# Patient Record
Sex: Female | Born: 1970 | Hispanic: No | Marital: Single | Smoking: Former smoker
Health system: Southern US, Community
[De-identification: ages and names within clinical notes are randomized; demographics above are authoritative.]

## PROBLEM LIST (undated history)

## (undated) DIAGNOSIS — F4310 Post-traumatic stress disorder, unspecified: Secondary | ICD-10-CM

## (undated) DIAGNOSIS — F429 Obsessive-compulsive disorder, unspecified: Secondary | ICD-10-CM

## (undated) DIAGNOSIS — J45909 Unspecified asthma, uncomplicated: Secondary | ICD-10-CM

## (undated) DIAGNOSIS — F319 Bipolar disorder, unspecified: Secondary | ICD-10-CM

## (undated) DIAGNOSIS — K219 Gastro-esophageal reflux disease without esophagitis: Secondary | ICD-10-CM

## (undated) DIAGNOSIS — F32A Depression, unspecified: Secondary | ICD-10-CM

## (undated) DIAGNOSIS — F419 Anxiety disorder, unspecified: Secondary | ICD-10-CM

## (undated) DIAGNOSIS — M1990 Unspecified osteoarthritis, unspecified site: Secondary | ICD-10-CM

## (undated) DIAGNOSIS — F329 Major depressive disorder, single episode, unspecified: Secondary | ICD-10-CM

---

## 2015-03-18 ENCOUNTER — Emergency Department (HOSPITAL_COMMUNITY)
Admission: EM | Admit: 2015-03-18 | Discharge: 2015-03-18 | Disposition: A | Discharge status: Home / Self Care | Payer: Medicaid Other | Attending: Emergency Medicine | Admitting: Emergency Medicine

## 2015-03-18 ENCOUNTER — Encounter (HOSPITAL_COMMUNITY): Payer: Self-pay

## 2015-03-18 DIAGNOSIS — Z720 Tobacco use: Secondary | ICD-10-CM | POA: Insufficient documentation

## 2015-03-18 DIAGNOSIS — M069 Rheumatoid arthritis, unspecified: Secondary | ICD-10-CM

## 2015-03-18 DIAGNOSIS — J45909 Unspecified asthma, uncomplicated: Secondary | ICD-10-CM | POA: Insufficient documentation

## 2015-03-18 HISTORY — DX: Unspecified asthma, uncomplicated: J45.909

## 2015-03-18 HISTORY — DX: Unspecified osteoarthritis, unspecified site: M19.90

## 2015-03-18 MED ORDER — HYDROCODONE-ACETAMINOPHEN 5-325 MG PO TABS: 1.0000 | ORAL_TABLET | Freq: Four times a day (QID) | ORAL | Status: DC | PRN

## 2015-03-18 MED ORDER — INDOMETHACIN 25 MG PO CAPS: 25.0000 mg | ORAL_CAPSULE | Freq: Three times a day (TID) | ORAL | Status: DC | PRN

## 2015-03-18 NOTE — Discharge Instructions (Signed)
Rheumatoid Arthritis Rheumatoid arthritis is a long-term (chronic) inflammatory disease that causes pain, swelling, and stiffness of the joints. It can affect the entire body, including the eyes and lungs. The effects of rheumatoid arthritis vary widely among those with the condition. CAUSES  The cause of rheumatoid arthritis is not known. It tends to run in families and is more common in women. Certain cells of the body's natural defense system (immune system) do not work properly and begin to attack healthy joints. It primarily involves the connective tissue that lines the joints (synovial membrane). This can cause damage to the joint. SYMPTOMS   Pain, stiffness, swelling, and decreased motion of many joints, especially in the hands and feet.  Stiffness that is worse in the morning. It may last 1-2 hours or longer.  Numbness and tingling in the hands.  Fatigue.  Loss of appetite.  Weight loss.  Low-grade fever.  Dry eyes and mouth.  Firm lumps (rheumatoid nodules) that grow beneath the skin in areas such as the elbows and hands. DIAGNOSIS  Diagnosis is based on the symptoms described, an exam, and blood tests. Sometimes, X-rays are helpful. TREATMENT  The goals of treatment are to relieve pain, reduce inflammation, and to slow down or stop joint damage and disability. Methods vary and may include:  Maintaining a balance of rest, exercise, and proper nutrition.  Medicines:  Pain relievers (analgesics).  Corticosteroids and nonsteroidal anti-inflammatory drugs (NSAIDs) to reduce inflammation.  Disease-modifying antirheumatic drugs (DMARDs) to try to slow the course of the disease.  Biologic response modifiers to reduce inflammation and damage.  Physical therapy and occupational therapy.  Surgery for patients with severe joint damage. Joint replacement or fusing of joints may be needed.  Routine monitoring and ongoing care, such as office visits, blood and urine tests, and  X-rays. HOME CARE INSTRUCTIONS   Remain physically active and reduce activity when the disease gets worse.  Eat a well-balanced diet.  Put heat on affected joints when you wake up and before activities. Keep the heat on the affected joint for as long as directed by your health care provider.  Put ice on affected joints following activities or exercising.  Put ice in a plastic bag.  Place a towel between your skin and the bag.  Leave the ice on for 15-20 minutes, 3-4 times per day, or as directed by your health care provider.  Take medicines and supplements only as directed by your health care provider.  Use splints as directed by your health care provider. Splints help maintain joint position and function.  Do not sleep with pillows under your knees. This may lead to spasms.  Participate in a self-management program to keep current with the latest treatment and coping skills. SEEK IMMEDIATE MEDICAL CARE IF:  You have fainting episodes.  You have periods of extreme weakness.  You rapidly develop a hot, painful joint that is more severe than usual joint aches.  You have chills.  You have a fever. FOR MORE INFORMATION   American College of Rheumatology: www.rheumatology.org  Arthritis Foundation: www.arthritis.org Document Released: 07/04/2000 Document Revised: 11/21/2013 Document Reviewed: 08/13/2011 [facility] Patient Information 2015 [place], [place]. This information is not intended to replace advice given to you by your health care provider. Make sure you discuss any questions you have with your health care provider.  Emergency Department Resource Guide 1) Find a Doctor and Pay Out of Pocket Although you won't have to find out who is covered by your insurance plan, it is  a good idea to ask around and get recommendations. You will then need to call the office and see if the doctor you have chosen will accept you as a new patient and what types of options they offer for  patients who are self-pay. Some doctors offer discounts or will set up payment plans for their patients who do not have insurance, but you will need to ask so you aren't surprised when you get to your appointment.  2) Contact Your Local Health Department Not all health departments have doctors that can see patients for sick visits, but many do, so it is worth a call to see if yours does. If you don't know where your local health department is, you can check in your phone book. The CDC also has a tool to help you locate your state's health department, and many state websites also have listings of all of their local health departments.  3) Find a Walk-in Clinic If your illness is not likely to be very severe or complicated, you may want to try a walk in clinic. These are popping up all over the country in pharmacies, drugstores, and shopping centers. They're usually staffed by nurse practitioners or physician assistants that have been trained to treat common illnesses and complaints. They're usually fairly quick and inexpensive. However, if you have serious medical issues or chronic medical problems, these are probably not your best option.  No Primary Care Doctor: - Call Health Connect at  [phone] - they can help you locate a primary care doctor that  accepts your insurance, provides certain services, etc. - Physician Referral Service- [phone]  Chronic Pain Problems: Organization         Address  Phone   Notes  [facility]  [phone] Patients need to be referred by their primary care doctor.   Medication Assistance: Organization         Address  Phone   Notes  [facility] Medication [facility] [address] [place] [place], [place] [address] [phone] --Must be a resident of [facility] -- Must have NO insurance coverage whatsoever (no Medicaid/ Medicare, etc.) -- The pt. MUST have a primary care doctor that directs their care regularly  and follows them in the community   [facility]  [phone]   [employer]  [phone]    Agencies that provide inexpensive medical care: Organization         Address  Phone   Notes  [facility]  [phone]   [facility]    [phone]   [facility] [address] [place], [place] [address] [phone]   [facility] [address] [place] [place] [phone]   Planned Parenthood    [phone]   [facility]    [phone]   [facility] and [facility]  [address], [place] Phone:  [phone], Fax:  [phone] Hours of Operation:  9 am - 6 pm, M-F.  Also accepts Medicaid/Medicare and self-pay.  [facility] [place] [facility]  [address], [place] Phone: [phone], Fax: [phone]. Hours of Operation:  8:30 am - 5:30 pm, M-F.  Also accepts Medicaid and self-pay.  [facility] [place] [address], [place] Phone: [phone]   [facility] [address] [place] [address] Natasha Bence [place], [place] [phone], Ext. [phone] Mondays & Thursdays: 7-9 AM.  First 15 patients are seen on a first come, first serve basis.    Medicaid-accepting [facility] Providers:  Organization         Address  Phone   Notes  [facility] [address], [place] [phone] Also accepts self-pay patients.  [facility] [address] Laurell Josephs [place], [place]  [phone]   [facility] [address], [place] [phone]   [facility] [address] [place] [phone]   Renaye Rakers [address], [place]   [phone] Only accepts [place] Access [place] patients after they have their name applied to their card.   Self-Pay (no insurance) in [facility]:  Organization         Address  Phone   Notes  [facility]ick[facility]  Cell Patients, [facility] [address] [place], [place] [phone]   [facility] [address] [place], [place] [phone]   [facility] [place]  [address], [place] [phone]   [facility]/Dr. Osei-Bonsu  [address], [place] or [address], [place] [phone] Phone number for both [place] and [place] locations is the same.  [facility] [address] [place] [phone]   [facility] [address] [place] or [address] [phone]   [facility] [address], [place] [phone], phone; [phone], fax Sees patients 1st and 3rd Saturday of every month.  Must not qualify for public or private insurance (i.e. Medicaid, Medicare, [place] Health Choice, Veterans' Benefits)  Household income should be no more than 200% of the poverty level The clinic cannot treat you if you are pregnant or think you are pregnant  Sexually transmitted diseases are not treated at the clinic.    Dental Care: Organization         Address  Phone  Notes  [facility] Department of [facility] [address] [place], [place] [phone] Accepts children up to age 95 who are enrolled in [place] or [place] Health Choice; pregnant women with a Medicaid card; and children who have applied for Medicaid or [place] Health Choice, but were declined, whose parents can pay a reduced fee at time of service.  [facility] Department of [facility]  [address], [place] [phone] Accepts children up to age 69 who are enrolled in [place] or [place] Health Choice; pregnant women with a Medicaid card; and children who have applied for Medicaid or  Health Choice, but were declined, whose parents can pay a reduced fee at time of service.  [facility]  [address] [place], [place] [phone] Patients are seen by appointment only. Walk-ins are not accepted. [facility] will see patients 5 years of age and older. Monday - Tuesday (8am-5pm) Most Wednesdays (8:30-5pm) $30 per visit, cash only  [facility]  [address], [facility] [phone] Patients are seen by appointment only. Walk-ins are not accepted. [facility] will see patients 40 years of age and older. One Wednesday Evening (Monthly: Volunteer Based).  $30 per visit, cash only  [employer]  [phone] for adults; Children under age 62, call Graduate  Pediatric Dentistry at [phone]. Children aged 41-14, please call [phone] to request a pediatric application.  Dental services are provided in all areas of dental care including fillings, crowns and bridges, complete and partial dentures, implants, gum treatment, root canals, and extractions. Preventive care is also provided. Treatment is provided to both adults and children. Patients are selected via a lottery and there is often a waiting list.   [facility] [address], [place]  [phone] [email or web address]   [facility] [address] [place], [place] [phone], Ext. [phone] Second and Fourth Thursday of each month, opens at 6:30 AM; Clinic ends at 9 AM.  Patients are seen on a first-come first-served basis, and a limited number are seen during each clinic.   [facility]  [address] Ether Griffins [place], [place] [phone]   Eligibility Requirements You must have lived in [place], [place], or [place] counties for at least the last three months.   You cannot be eligible for state or federal sponsored [place], including [employer], [place], or [employer].   You generally cannot be eligible for healthcare insurance through your employer.    How to apply: Eligibility  screenings are held every Tuesday and Wednesday afternoon from 1:00 pm until 4:00 pm. You do not need an appointment for the interview!  [facility] [address], [place], [place] [phone]   [facility] Health Department  [phone]   [facility] Health Department  [phone]   [facility] Health Department  [phone]    Behavioral Health Resources in the Community: Intensive Outpatient Programs Organization         Address  Phone  Notes  [facility] Services [address], [place], [place] [phone]   [facility] Outpatient [address] [place], [place] [phone]   [facility] [address], [place], [place]  [phone]   [facility] [address],  [place], [place] [phone] or [phone]   Substance Abuse Resources Organization         Address  Phone  Notes  [facility]  [phone]   [facility]  [phone]   [facility] [place]  [phone]   Floydene Flock  [phone]   Residential & Outpatient Substance Abuse Program  [phone]   Psychological Services Organization         Address  Phone  Notes  [facility] Behavioral Health  [phone]   [facility] Services  [phone]   [facility] Mental Health [address] [place] [phone] or [phone]    Mobile Crisis Teams Organization         Address  Phone  Notes  [facility], Mobile Crisis Care Unit  [phone]   [facility]  [address] [place], [place] [phone]   Doristine Locks [address] [place] [phone]    Self-Help/Support Groups Organization         Address  Phone             Notes  [facility] [place] - variety of support groups  336- [phone] Call for more information  Narcotics Anonymous (NA), [facility] [address], [employer] [place]  2 meetings at  this location   Statistician         Address  Phone  Notes  [facility] [address],  [place]  [phone]   [facility]  [address], [place] [address], [place], [place] [phone]   [facility] [address] [place], [place] [phone] Admissions: 8am-3pm M-F  [facility] [address],    [place], [place] [phone]   [facility] [address] [place], [place], [place] [phone]   [facility] [address],  [place], [place] [phone]   [facility] - Intensive Outpatient [address] Laurell Josephs [address], [place], [place] [phone]   [facility] ([facility] [address] [place]  [place], [place] [phone] or [phone]   [facility] ([facility]) [address], [place], [place] [phone] Accepts Medicaid  [facility] [address],  [place] [phone] Substance Abuse/Addiction Treatment   [facility] Organization         Address  Phone  Notes  [facility]  [phone]   Angie Fava, PhD [address] Ervin Knack [place], [place]   [phone] or [phone]   [facility] Behavioral   [address] [place], [place] [phone]   [facility] [address], [place], [place] [phone] Insurance/Medicaid/sponsorship through [facility] [address] [place] [address]                                    [place], [place] [phone] Therapy/tele-psych/case  [facility] [address], [place] [phone]    Dr. Lolly Mustache  [phone]   [facility] [place]  [employer] [facility] Dept. 1) [address], [place] 2) [address], [place] 3)  [address], [place] [phone]  [phone]   [facility] Child Abuse Hotline [phone] or 5348017415 (After Hours)

## 2015-03-18 NOTE — ED Provider Notes (Signed)
CSN: [id number]     Arrival date & time 03/18/15  1655 History   First MD Initiated Contact with Patient 03/18/15 2002     Chief Complaint  Patient presents with  . Rheumatoid Arthritis     (Consider location/radiation/quality/duration/timing/severity/associated sxs/prior Treatment) HPI Comments: 44 year old female with a history of rheumatoid arthritis presents to the emergency department for further evaluation of pain associated with her rheumatoid arthritis. Patient reports an aching, constant pain which waxes and wanes. It is present mostly in the MCP joints of her hands. Patient reports that pain is worse with movement. She has been taking 2 tablets of 220 mg Aleve twice a day for pain without significant improvement. Patient has also noted pain to her right shoulder which radiates up her upper back into the base of her neck. Patient denies any recent strenuous activity or heavy lifting. She states that she relocated to the area 3.5 months ago. She has had no medications for 5-6 months. Her pain has been at its worst for the last 3-4 days. She is requesting a refill of her maintenance medications for rheumatoid arthritis including methotrexate, Indocin, and folic acid. She states that she is looking for a primary care doctor and was approved for Medicaid one week ago.  The history is provided by the patient. No language interpreter was used.    Past Medical History  Diagnosis Date  . Arthritis     rheumatoid   . Asthma    History reviewed. No pertinent past surgical history. History reviewed. No pertinent family history. Social History  Substance Use Topics  . Smoking status: Current Every Day Smoker -- 0.50 packs/day    Types: Cigarettes  . Smokeless tobacco: None  . Alcohol Use: Yes     Comment: occ    OB History    No data available      Review of Systems  Constitutional: Negative for fever.  Musculoskeletal: Positive for myalgias, joint swelling and arthralgias.   Neurological: Negative for weakness.  All other systems reviewed and are negative.   Allergies  Review of patient's allergies indicates no known allergies.  Home Medications   Prior to Admission medications   Medication Sig Start Date End Date Taking? Authorizing Provider  HYDROcodone-acetaminophen (NORCO/VICODIN) 5-325 MG per tablet Take 1-2 tablets by mouth every 6 (six) hours as needed for severe pain. 03/18/15   Antony Madura, PA-C  indomethacin (INDOCIN) 25 MG capsule Take 1 capsule (25 mg total) by mouth 3 (three) times daily as needed. 03/18/15   Antony Madura, PA-C   BP 125/84 mmHg  Pulse 72  Temp(Src) 98.3 F (36.8 C) (Oral)  Resp 16  Ht 5\' 5"  (1.651 m)  Wt 120 lb (54.432 kg)  BMI 19.97 kg/m2  SpO2 100%  LMP 03/12/2015   Physical Exam  Constitutional: She is oriented to person, place, and time. She appears well-developed and well-nourished. No distress.  Nontoxic/nonseptic appearing  HENT:  Head: Normocephalic and atraumatic.  Eyes: Conjunctivae and EOM are normal. No scleral icterus.  Neck: Normal range of motion.  Cardiovascular: Normal rate, regular rhythm and intact distal pulses.   Distal radial pulse 2+ b/l. Capillary refill brisk in all digits.  Pulmonary/Chest: Effort normal. No respiratory distress.  Respirations even and unlabored.  Musculoskeletal: Normal range of motion.  Swelling and mild deformity diffusely to MCP joints as well as to the R DIP joint and the DIP joint of b/l first fingers. Patient with TTP along the course of the R trapezius muscle.  Normal ROM exhibited to all joints of b/l upper extremities.  Neurological: She is alert and oriented to person, place, and time. She exhibits normal muscle tone. Coordination normal.  Sensation to light touch intact. Patient has 5/5 grip strength in b/l upper extremities.  Skin: Skin is warm and dry. No rash noted. She is not diaphoretic. No erythema. No pallor.  Psychiatric: She has a normal mood and affect.  Her behavior is normal.  Nursing note and vitals reviewed.   ED Course  Procedures (including critical care time) Labs Review Labs Reviewed - No data to display  Imaging Review No results found.   I have personally reviewed and evaluated these images and lab results as part of my medical decision-making.   EKG Interpretation None      MDM   Final diagnoses:  Rheumatoid arthritis flare    44 year old female presents to the emergency department for assistance with pain control of a rheumatoid arthritis flare. Patient was followed by a rheumatologist in [place], [place], but relocated to the area 3.5 months ago and has yet to find a doctor. Patient with evidence of swelling and deformity to her MCP joints as well as scattered DIP joints of bilateral hands. No evidence of septic arthritis as joints are not erythematous or hot to touch. Patient has fairly well preserved range of motion in all of her digits.  Patient is afebrile and well-appearing. Have discussed with patient that she needs to find a doctor as soon as possible to begin her methotrexate regimen. I have discussed with her my hesitancy in prescribing her this medication, as she would need adequate follow-up to ensure no adverse side effects. Patient verbalizes understanding. I will, however, refill patient's prescription of indomethacin which she has not had for some months. Have advised that she not take naproxen or ibuprofen with this medication. Will give a short course of Norco for pain control. Have discussed with the patient that this (Norco) is a not a long-term solution for her pain control. Patient verbalizes understanding to this as well. Patient has been given the resource guide as well as referral to to primary care offices within the [facility] system. Return precautions discussed and provided. Patient agreeable to plan with known address concerns. Patient discharged in good condition.   Filed Vitals:   03/18/15  1731 03/18/15 2028  BP: 117/86 125/84  Pulse: 65 72  Temp: 98.3 F (36.8 C)   TempSrc: Oral   Resp: 16 16  Height: 5\' 5"  (1.651 m)   Weight: 120 lb (54.432 kg)   SpO2: 99% 100%     , PA-C 03/18/15 2111  2112, MD 03/19/15 [phone]

## 2015-03-18 NOTE — ED Notes (Signed)
Pt. Standing up in room talking on cell phone.

## 2015-03-18 NOTE — ED Notes (Signed)
Pt reports she has just moved to area and does not have a PCP to follow her rheumatoid arthritis.  Pt has not had her medications in 5-6 months.  Pt is not able to tolerate pain any longer.

## 2015-06-19 ENCOUNTER — Emergency Department (HOSPITAL_COMMUNITY): Payer: Medicaid Other

## 2015-06-19 ENCOUNTER — Emergency Department (HOSPITAL_COMMUNITY)
Admission: EM | Admit: 2015-06-19 | Discharge: 2015-06-19 | Disposition: A | Discharge status: Home / Self Care | Payer: Medicaid Other | Attending: Emergency Medicine | Admitting: Emergency Medicine

## 2015-06-19 ENCOUNTER — Encounter (HOSPITAL_COMMUNITY): Payer: Self-pay | Admitting: Emergency Medicine

## 2015-06-19 ENCOUNTER — Other Ambulatory Visit: Payer: Self-pay

## 2015-06-19 DIAGNOSIS — R0789 Other chest pain: Secondary | ICD-10-CM

## 2015-06-19 DIAGNOSIS — F419 Anxiety disorder, unspecified: Secondary | ICD-10-CM | POA: Insufficient documentation

## 2015-06-19 DIAGNOSIS — R079 Chest pain, unspecified: Secondary | ICD-10-CM | POA: Diagnosis present

## 2015-06-19 DIAGNOSIS — F17210 Nicotine dependence, cigarettes, uncomplicated: Secondary | ICD-10-CM | POA: Insufficient documentation

## 2015-06-19 DIAGNOSIS — M069 Rheumatoid arthritis, unspecified: Secondary | ICD-10-CM | POA: Insufficient documentation

## 2015-06-19 DIAGNOSIS — J45909 Unspecified asthma, uncomplicated: Secondary | ICD-10-CM | POA: Diagnosis not present

## 2015-06-19 LAB — CBC
HEMATOCRIT: 37.6 % (ref 36.0–46.0)
HEMOGLOBIN: 12.5 g/dL (ref 12.0–15.0)
MCH: 28.9 pg (ref 26.0–34.0)
MCHC: 33.2 g/dL (ref 30.0–36.0)
MCV: 86.8 fL (ref 78.0–100.0)
Platelets: 297 10*3/uL (ref 150–400)
RBC: 4.33 MIL/uL (ref 3.87–5.11)
RDW: 15.5 % (ref 11.5–15.5)
WBC: 9.2 10*3/uL (ref 4.0–10.5)

## 2015-06-19 LAB — I-STAT TROPONIN, ED: Troponin i, poc: 0 ng/mL (ref 0.00–0.08)

## 2015-06-19 LAB — BASIC METABOLIC PANEL
Anion gap: 7 (ref 5–15)
BUN: 12 mg/dL (ref 6–20)
CALCIUM: 9 mg/dL (ref 8.9–10.3)
CHLORIDE: 103 mmol/L (ref 101–111)
CO2: 26 mmol/L (ref 22–32)
CREATININE: 0.84 mg/dL (ref 0.44–1.00)
GFR calc Af Amer: >60 mL/min (ref >60)
GFR calc non Af Amer: >60 mL/min (ref >60)
Glucose, Bld: 104 mg/dL — ABNORMAL HIGH (ref 65–99)
Potassium: 4.1 mmol/L (ref 3.5–5.1)
SODIUM: 136 mmol/L (ref 135–145)

## 2015-06-19 MED ORDER — KETOROLAC TROMETHAMINE 30 MG/ML IJ SOLN: 30.0000 mg | Freq: Once | INTRAMUSCULAR | Status: DC

## 2015-06-19 MED ORDER — HYDROCODONE-ACETAMINOPHEN 5-325 MG PO TABS: 1.0000 | ORAL_TABLET | Freq: Four times a day (QID) | ORAL | Status: DC | PRN

## 2015-06-19 MED ORDER — OXYCODONE-ACETAMINOPHEN 5-325 MG PO TABS
1.0000 | ORAL_TABLET | Freq: Once | ORAL | Status: AC
  Administered 2015-06-19: 1 via ORAL
  Filled 2015-06-19: qty 1

## 2015-06-19 MED ORDER — IBUPROFEN 400 MG PO TABS
600.0000 mg | ORAL_TABLET | Freq: Once | ORAL | Status: AC
  Administered 2015-06-19: 600 mg via ORAL
  Filled 2015-06-19: qty 1

## 2015-06-19 MED ORDER — INDOMETHACIN 25 MG PO CAPS: 25.0000 mg | ORAL_CAPSULE | Freq: Three times a day (TID) | ORAL | Status: DC | PRN

## 2015-06-19 NOTE — ED Provider Notes (Signed)
CSN: [id number]     Arrival date & time 06/19/15  2355 History   First MD Initiated Contact with Patient 06/19/15 0450     Chief Complaint  Patient presents with  . Chest Pain     (Consider location/radiation/quality/duration/timing/severity/associated sxs/prior Treatment) HPI  This is a 44 year old female with a history of rheumatoid arthritis who presents with chest pain. Patient reports onset of chest pain several hours prior to arrival. It is sharp and right-sided. It is nonradiating. It is currently 8 out of 10. It is worse with movement and different positioning. Patient denies shortness of breath, cough, or fever. Denies any history high cholesterol, hypertension, early family history of heart disease, or diabetes. Denies any lower extremity swelling. Patient reports that she has had difficulty getting her rheumatoid arthritis medications and is no longer taking her methotrexate, folic acid, or Indocin.  Past Medical History  Diagnosis Date  . Arthritis     rheumatoid   . Asthma    History reviewed. No pertinent past surgical history. History reviewed. No pertinent family history. Social History  Substance Use Topics  . Smoking status: Current Every Day Smoker -- 0.50 packs/day    Types: Cigarettes  . Smokeless tobacco: None  . Alcohol Use: Yes     Comment: occ    OB History    No data available     Review of Systems  Constitutional: Negative for fever.  Respiratory: Positive for chest tightness. Negative for cough and shortness of breath.   Cardiovascular: Positive for chest pain. Negative for leg swelling.  Gastrointestinal: Negative for nausea, vomiting and abdominal pain.  Genitourinary: Negative for dysuria.  Skin: Negative for rash.  Neurological: Negative for headaches.  All other systems reviewed and are negative.     Allergies  Review of patient's allergies indicates no known allergies.  Home Medications   Prior to Admission medications    Medication Sig Start Date End Date Taking? Authorizing Provider  HYDROcodone-acetaminophen (NORCO/VICODIN) 5-325 MG tablet Take 1-2 tablets by mouth every 6 (six) hours as needed for severe pain. 06/19/15   Shon Baton, MD  indomethacin (INDOCIN) 25 MG capsule Take 1 capsule (25 mg total) by mouth 3 (three) times daily as needed. 06/19/15   Shon Baton, MD   BP 118/73 mmHg  Pulse 61  Temp(Src) 98.5 F (36.9 C) (Oral)  Resp 19  Ht 5\' 1"  (1.549 m)  Wt 120 lb (54.432 kg)  BMI 22.69 kg/m2  SpO2 100%  LMP 06/10/2015 Physical Exam  Constitutional: She is oriented to person, place, and time. She appears well-developed and well-nourished.  Anxious appearing  HENT:  Head: Normocephalic and atraumatic.  Cardiovascular: Normal rate, regular rhythm and normal heart sounds.   Pulmonary/Chest: Effort normal and breath sounds normal. No respiratory distress. She has no wheezes. She exhibits tenderness.  Tenderness palpation of the right chest wall without crepitus  Abdominal: Soft. Bowel sounds are normal.  Musculoskeletal: She exhibits no edema.  Neurological: She is alert and oriented to person, place, and time.  Skin: Skin is warm and dry.  Psychiatric: She has a normal mood and affect.  Nursing note and vitals reviewed.   ED Course  Procedures (including critical care time) Labs Review Labs Reviewed  BASIC METABOLIC PANEL - Abnormal; Notable for the following:    Glucose, Bld 104 (*)    All other components within normal limits  CBC  I-STAT TROPOININ, ED    Imaging Review Dg Chest 2 View  06/19/2015  CLINICAL DATA:  Central chest pain onset tonight. EXAM: CHEST  2 VIEW COMPARISON:  None. FINDINGS: Mild linear lingular opacity could represent early infiltrate. The lungs are otherwise clear. No pleural effusions. Hilar, mediastinal and cardiac contours are unremarkable. Normal pulmonary vasculature. IMPRESSION: Question early lingular infiltrate. Consider follow-up  radiography in 3-4 weeks to confirm complete resolution. Electronically Signed   By: Ellery Plunk M.D.   On: 06/19/2015 05:26   I have personally reviewed and evaluated these images and lab results as part of my medical decision-making.   EKG Interpretation   Date/Time:  Tuesday June 19 2015 04:46:58 EST Ventricular Rate:  62 PR Interval:  142 QRS Duration: 83 QT Interval:  451 QTC Calculation: 458 R Axis:   61 Text Interpretation:  Sinus rhythm Confirmed by HORTON  MD, COURTNEY  ([id number]) on 06/19/2015 5:48:24 AM      MDM   Final diagnoses:  Chest wall pain    Patient presents with chest pain. It is reproducible on exam. EKG is reassuring and patient is low risk for ACS. Basic lab work including troponin is normal. Chest x-ray shows a mild linear irregularity which could be an early infiltrate. Patient denies cough, fever and has no evidence of leukocytosis. Doubt pneumonia. Patient improved with ibuprofen and Percocet. Discussed with patient supportive care home.  I will refill her indomethacin for anti-inflammatory effect and she will be given a short course of Norco. Patient was given strict return precautions.  After history, exam, and medical workup I feel the patient has been appropriately medically screened and is safe for discharge home. Pertinent diagnoses were discussed with the patient. Patient was given return precautions.     Shon Baton, MD 06/20/15 [phone]

## 2015-06-19 NOTE — Discharge Instructions (Signed)

## 2015-06-19 NOTE — ED Notes (Signed)
Pt state she is having 8/10 CP on her mid chest going mostly to her right side. Pt is very anxious, family is at the bedside, NAD noticed.

## 2015-08-16 ENCOUNTER — Emergency Department (HOSPITAL_COMMUNITY)
Admission: EM | Admit: 2015-08-16 | Discharge: 2015-08-16 | Disposition: A | Discharge status: Home / Self Care | Payer: Medicaid Other | Attending: Emergency Medicine | Admitting: Emergency Medicine

## 2015-08-16 ENCOUNTER — Encounter (HOSPITAL_COMMUNITY): Payer: Self-pay | Admitting: *Deleted

## 2015-08-16 DIAGNOSIS — R05 Cough: Secondary | ICD-10-CM | POA: Diagnosis present

## 2015-08-16 DIAGNOSIS — M069 Rheumatoid arthritis, unspecified: Secondary | ICD-10-CM

## 2015-08-16 DIAGNOSIS — J45909 Unspecified asthma, uncomplicated: Secondary | ICD-10-CM | POA: Insufficient documentation

## 2015-08-16 DIAGNOSIS — B9789 Other viral agents as the cause of diseases classified elsewhere: Secondary | ICD-10-CM

## 2015-08-16 DIAGNOSIS — F17210 Nicotine dependence, cigarettes, uncomplicated: Secondary | ICD-10-CM | POA: Insufficient documentation

## 2015-08-16 DIAGNOSIS — J069 Acute upper respiratory infection, unspecified: Secondary | ICD-10-CM

## 2015-08-16 MED ORDER — OXYCODONE-ACETAMINOPHEN 5-325 MG PO TABS: 2.0000 | ORAL_TABLET | ORAL | Status: DC | PRN

## 2015-08-16 MED ORDER — PREDNISONE 20 MG PO TABS
60.0000 mg | ORAL_TABLET | Freq: Once | ORAL | Status: AC
  Administered 2015-08-16: 60 mg via ORAL
  Filled 2015-08-16: qty 3

## 2015-08-16 MED ORDER — PREDNISONE 10 MG (21) PO TBPK: 10.0000 mg | ORAL_TABLET | Freq: Every day | ORAL | Status: DC

## 2015-08-16 NOTE — Discharge Instructions (Signed)
Rheumatoid Arthritis Rheumatoid arthritis is a disease that causes pain, puffiness (swelling), and stiffness of the joints. It is a long-term (chronic) disease. It can affect the whole body, even the eyes and lungs. Your doctor will work with you to find the best treatment option for you. This will depend on how the disease is progressing in your body. HOME CARE  Stay active, but lessen activity when the disease gets worse.  Eat healthy foods.  Put heat on the affected joints when you wake up and before activity. Keep the heat on for as long as told by your doctor.  Put ice on the affected joints after activity or exercise.  Put ice in a plastic bag.  Place a towel between your skin and the bag.  Leave the ice on for 15 to 20 minutes, 3 to 4 times a day.  Take all medicines and other dietary pills (supplements) only as told by your doctor. Your doctor may adjust your medicines every 3 months.  Use a splint as told by your doctor. Splints help keep joints in a certain position to keep the joint working right.  Do not sleep with pillows under your knees.  Go to programs that can keep you updated on treatments and ways to deal with your disease. GET HELP RIGHT AWAY IF:  You have times where you pass out (faint).  You have times where you are really weak.  You suddenly have a hot, painful joint that feels worse than your normal joint ache.  You have chills.  You have a fever.   This information is not intended to replace advice given to you by your health care provider. Make sure you discuss any questions you have with your health care provider.   Document Released: 09/29/2011 Document Revised: 07/28/2014 Document Reviewed: 09/29/2011 Elsevier Interactive Patient Education 2016 [employer]  Upper Respiratory Infection, Adult Most upper respiratory infections (URIs) are a viral infection of the air passages leading to the lungs. A URI affects the nose, throat, and upper air  passages. The most common type of URI is nasopharyngitis and is typically referred to as "the common cold." URIs run their course and usually go away on their own. Most of the time, a URI does not require medical attention, but sometimes a bacterial infection in the upper airways can follow a viral infection. This is called a secondary infection. Sinus and middle ear infections are common types of secondary upper respiratory infections. Bacterial pneumonia can also complicate a URI. A URI can worsen asthma and chronic obstructive pulmonary disease (COPD). Sometimes, these complications can require emergency medical care and may be life threatening.  CAUSES Almost all URIs are caused by viruses. A virus is a type of germ and can spread from one person to another.  RISKS FACTORS You may be at risk for a URI if:   You smoke.   You have chronic heart or lung disease.  You have a weakened defense (immune) system.   You are very young or very old.   You have nasal allergies or asthma.  You work in crowded or poorly ventilated areas.  You work in health care facilities or schools. SIGNS AND SYMPTOMS  Symptoms typically develop 2-3 days after you come in contact with a cold virus. Most viral URIs last 7-10 days. However, viral URIs from the influenza virus (flu virus) can last 14-18 days and are typically more severe. Symptoms may include:   Runny or stuffy (congested) nose.  Sneezing.   Cough.   Sore throat.   Headache.   Fatigue.   Fever.   Loss of appetite.   Pain in your forehead, behind your eyes, and over your cheekbones (sinus pain).  Muscle aches.  DIAGNOSIS  Your health care provider may diagnose a URI by:  Physical exam.  Tests to check that your symptoms are not due to another condition such as:  Strep throat.  Sinusitis.  Pneumonia.  Asthma. TREATMENT  A URI goes away on its own with time. It cannot be cured with medicines, but medicines may  be prescribed or recommended to relieve symptoms. Medicines may help:  Reduce your fever.  Reduce your cough.  Relieve nasal congestion. HOME CARE INSTRUCTIONS   Take medicines only as directed by your health care provider.   Gargle warm saltwater or take cough drops to comfort your throat as directed by your health care provider.  Use a warm mist humidifier or inhale steam from a shower to increase air moisture. This may make it easier to breathe.  Drink enough fluid to keep your urine clear or pale yellow.   Eat soups and other clear broths and maintain good nutrition.   Rest as needed.   Return to work when your temperature has returned to normal or as your health care provider advises. You may need to stay home longer to avoid infecting others. You can also use a face mask and careful hand washing to prevent spread of the virus.  Increase the usage of your inhaler if you have asthma.   Do not use any tobacco products, including cigarettes, chewing tobacco, or electronic cigarettes. If you need help quitting, ask your health care provider. PREVENTION  The best way to protect yourself from getting a cold is to practice good hygiene.   Avoid oral or hand contact with people with cold symptoms.   Wash your hands often if contact occurs.  There is no clear evidence that vitamin C, vitamin E, echinacea, or exercise reduces the chance of developing a cold. However, it is always recommended to get plenty of rest, exercise, and practice good nutrition.  SEEK MEDICAL CARE IF:   You are getting worse rather than better.   Your symptoms are not controlled by medicine.   You have chills.  You have worsening shortness of breath.  You have brown or red mucus.  You have yellow or brown nasal discharge.  You have pain in your face, especially when you bend forward.  You have a fever.  You have swollen neck glands.  You have pain while swallowing.  You have white  areas in the back of your throat. SEEK IMMEDIATE MEDICAL CARE IF:   You have severe or persistent:  Headache.  Ear pain.  Sinus pain.  Chest pain.  You have chronic lung disease and any of the following:  Wheezing.  Prolonged cough.  Coughing up blood.  A change in your usual mucus.  You have a stiff neck.  You have changes in your:  Vision.  Hearing.  Thinking.  Mood. MAKE SURE YOU:   Understand these instructions.  Will watch your condition.  Will get help right away if you are not doing well or get worse.   This information is not intended to replace advice given to you by your health care provider. Make sure you discuss any questions you have with your health care provider.   Follow-up with her primary care provider if her symptoms do not improve.  Take OTC Mucinex or Sudafed for cough and cold-like symptoms. Take steroids as prescribed. Take pain medication as needed for breakthrough pain. Continue taking Aleve for pain management as well. Return to the emergency department if you experience severe worsening of her symptoms, fever, difficulty breathing, difficulty swallowing, redness or warmth of a joint.

## 2015-08-16 NOTE — ED Notes (Signed)
See PA Assessment  

## 2015-08-16 NOTE — ED Provider Notes (Signed)
CSN: [id number]     Arrival date & time 08/16/15  1101 History  By signing my name below, I, Sheri Hall, attest that this documentation has been prepared under the direction and in the presence of Gaylyn Rong, PA-C Electronically Signed: Charline Bills, ED Scribe 08/16/2015 at 11:48 AM.   Chief Complaint  Patient presents with  . Joint Pain  . Cough   The history is provided by the patient. No language interpreter was used.    HPI Comments: Sheri Hall is a 45 y.o. female, with a h/o RA, who presents to the Emergency Department complaining of gradually worsening joint pain for the past few months. Pt states that pain originated in both hands but now radiates into her wrists and elbows. She reports that pain is similar to RA pain. Pt was diagnosed approximately 10 years ago. She does not currently have a local rheumatologist; she recently moved here 4 months ago. Pt typically treats RA with folic acid, Indocin and Methotrexate, which she has not taken in over a year. She has also tried Aleve without significant relief and Prednisone in the past with temporary relief.   Pt also presents with productive cough with yellow sputum for 1 week. She reports associated nasal congestion and sore throat. She has tried Mucinex and Tylenol Cold & Flu without significant relief. She denies fever and ear pain.   Past Medical History  Diagnosis Date  . Arthritis     rheumatoid   . Asthma    History reviewed. No pertinent past surgical history. History reviewed. No pertinent family history. Social History  Substance Use Topics  . Smoking status: Current Every Day Smoker -- 0.50 packs/day    Types: Cigarettes  . Smokeless tobacco: None  . Alcohol Use: Yes     Comment: occ    OB History    No data available     Review of Systems  Constitutional: Negative for fever.  HENT: Positive for congestion and sore throat. Negative for ear pain.   Respiratory: Positive for cough.    Musculoskeletal: Positive for arthralgias.  All other systems reviewed and are negative.  Allergies  Review of patient's allergies indicates no known allergies.  Home Medications   Prior to Admission medications   Medication Sig Start Date End Date Taking? Authorizing Provider  HYDROcodone-acetaminophen (NORCO/VICODIN) 5-325 MG tablet Take 1-2 tablets by mouth every 6 (six) hours as needed for severe pain. 06/19/15   Shon Baton, MD  indomethacin (INDOCIN) 25 MG capsule Take 1 capsule (25 mg total) by mouth 3 (three) times daily as needed. 06/19/15   Shon Baton, MD   BP 103/64 mmHg  Pulse 64  Temp(Src) 98.2 F (36.8 C) (Oral)  Resp 16  SpO2 99% Physical Exam  Constitutional: She is oriented to person, place, and time. She appears well-developed and well-nourished. No distress.  HENT:  Head: Normocephalic and atraumatic.  Mouth/Throat: Oropharynx is clear and moist. No oropharyngeal exudate.  Eyes: Conjunctivae and EOM are normal. Pupils are equal, round, and reactive to light. Right eye exhibits no discharge. Left eye exhibits no discharge. No scleral icterus.  Cardiovascular: Normal rate, regular rhythm, normal heart sounds and intact distal pulses.  Exam reveals no gallop and no friction rub.   No murmur heard. Pulmonary/Chest: Effort normal and breath sounds normal. No respiratory distress. She has no wheezes. She has no rales. She exhibits no tenderness.  Abdominal: Soft. She exhibits no distension. There is no tenderness. There is no guarding.  Musculoskeletal: Normal range of motion. She exhibits no edema.  Significant joint swelling over all MCP joints bilaterally with ulnar deviation. 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  Lymphadenopathy:    She has no cervical adenopathy.  Neurological: She is alert and oriented to person, place, and time.  Skin: Skin is warm and dry. No rash noted. She is not diaphoretic. No erythema. No pallor.  Psychiatric:  She has a normal mood and affect. Her behavior is normal.  Nursing note and vitals reviewed.  ED Course  Procedures (including critical care time) DIAGNOSTIC STUDIES: Oxygen Saturation is 99% on RA, normal by my interpretation.    COORDINATION OF CARE: 11:32 AM-Discussed treatment plan which includes Prednisone and Percocet with pt at bedside and pt agreed to plan.   Labs Review Labs Reviewed - No data to display  Imaging Review No results found.   EKG Interpretation None      MDM   Final diagnoses:  Rheumatoid arthritis flare (HCC)  Viral URI with cough   Pt presents with rheumatoid arthritis flare. Will give course of prednisone and pain medication. Pt recently moved to the area and does not have rheumatologist or PCP. Case management consulted to help pt establish care with local PCP in order to get referral to rheumatology.   Pt also c/o URI symptoms, likely viral in etiology. Pt afebrile. Lungs CTAB. Throat non-erythematous. Discussed that antibiotics are not indicated for viral infections. Pt will be discharged with symptomatic treatment.  Verbalizes understanding and is agreeable with plan. Pt is hemodynamically stable & in NAD prior to dc.   I personally performed the services described in this documentation, which was scribed in my presence. The recorded information has been reviewed and is accurate.     Lester Kinsman Bowlus, PA-C 08/18/15 1204  Loren Racer, MD 08/22/15 [phone]

## 2015-08-16 NOTE — ED Notes (Signed)
Declined W/C at D/C and was escorted to lobby by RN. 

## 2015-08-16 NOTE — ED Notes (Signed)
PT reports a Hx of RA . Pt Is new to area and does not have a MD. Pt is requesting meds.

## 2015-08-24 ENCOUNTER — Emergency Department (HOSPITAL_COMMUNITY)
Admission: EM | Admit: 2015-08-24 | Discharge: 2015-08-24 | Disposition: A | Discharge status: Home / Self Care | Payer: Medicaid Other | Attending: Physician Assistant | Admitting: Physician Assistant

## 2015-08-24 ENCOUNTER — Encounter (HOSPITAL_COMMUNITY): Payer: Self-pay

## 2015-08-24 DIAGNOSIS — M069 Rheumatoid arthritis, unspecified: Secondary | ICD-10-CM | POA: Insufficient documentation

## 2015-08-24 DIAGNOSIS — S4992XA Unspecified injury of left shoulder and upper arm, initial encounter: Secondary | ICD-10-CM | POA: Insufficient documentation

## 2015-08-24 DIAGNOSIS — Y998 Other external cause status: Secondary | ICD-10-CM | POA: Insufficient documentation

## 2015-08-24 DIAGNOSIS — S0990XA Unspecified injury of head, initial encounter: Secondary | ICD-10-CM | POA: Insufficient documentation

## 2015-08-24 DIAGNOSIS — J45909 Unspecified asthma, uncomplicated: Secondary | ICD-10-CM | POA: Insufficient documentation

## 2015-08-24 DIAGNOSIS — S199XXA Unspecified injury of neck, initial encounter: Secondary | ICD-10-CM | POA: Diagnosis present

## 2015-08-24 DIAGNOSIS — F17210 Nicotine dependence, cigarettes, uncomplicated: Secondary | ICD-10-CM | POA: Diagnosis not present

## 2015-08-24 DIAGNOSIS — Z79899 Other long term (current) drug therapy: Secondary | ICD-10-CM | POA: Insufficient documentation

## 2015-08-24 DIAGNOSIS — Y92410 Unspecified street and highway as the place of occurrence of the external cause: Secondary | ICD-10-CM | POA: Insufficient documentation

## 2015-08-24 DIAGNOSIS — S161XXA Strain of muscle, fascia and tendon at neck level, initial encounter: Secondary | ICD-10-CM | POA: Diagnosis not present

## 2015-08-24 DIAGNOSIS — Y9389 Activity, other specified: Secondary | ICD-10-CM | POA: Diagnosis not present

## 2015-08-24 MED ORDER — CYCLOBENZAPRINE HCL 10 MG PO TABS: 10.0000 mg | ORAL_TABLET | Freq: Two times a day (BID) | ORAL | Status: DC | PRN

## 2015-08-24 MED ORDER — IBUPROFEN 200 MG PO TABS
600.0000 mg | ORAL_TABLET | Freq: Once | ORAL | Status: AC
  Administered 2015-08-24: 600 mg via ORAL
  Filled 2015-08-24: qty 3

## 2015-08-24 MED ORDER — CYCLOBENZAPRINE HCL 10 MG PO TABS
10.0000 mg | ORAL_TABLET | Freq: Once | ORAL | Status: AC
  Administered 2015-08-24: 10 mg via ORAL
  Filled 2015-08-24: qty 1

## 2015-08-24 MED ORDER — HYDROCODONE-ACETAMINOPHEN 5-325 MG PO TABS: 1.0000 | ORAL_TABLET | ORAL | Status: DC | PRN

## 2015-08-24 NOTE — Discharge Instructions (Signed)
Take your medications as prescribed. You may also take 800 mg ibuprofen 3 times daily for pain relief. I recommend applying ice to affected regions for 15-20 minutes 3-4 times daily for the next 1-2 days, then you can apply heat. I recommend refraining from doing any heavy lifting and resting for the next few days until your symptoms have improved. Please follow up with a primary care provider from the Resource Guide provided below in 4-5 days. Please return to the Emergency Department if symptoms worsen or new onset of fever, headache, change in vision, lightheadedness, dizziness, numbness, tingling, weakness.   Emergency Department Resource Guide 1) Find a Doctor and Pay Out of Pocket Although you won't have to find out who is covered by your insurance plan, it is a good idea to ask around and get recommendations. You will then need to call the office and see if the doctor you have chosen will accept you as a new patient and what types of options they offer for patients who are self-pay. Some doctors offer discounts or will set up payment plans for their patients who do not have insurance, but you will need to ask so you aren't surprised when you get to your appointment.  2) Contact Your Local Health Department Not all health departments have doctors that can see patients for sick visits, but many do, so it is worth a call to see if yours does. If you don't know where your local health department is, you can check in your phone book. The CDC also has a tool to help you locate your state's health department, and many state websites also have listings of all of their local health departments.  3) Find a Walk-in Clinic If your illness is not likely to be very severe or complicated, you may want to try a walk in clinic. These are popping up all over the country in pharmacies, drugstores, and shopping centers. They're usually staffed by nurse practitioners or physician assistants that have been trained to  treat common illnesses and complaints. They're usually fairly quick and inexpensive. However, if you have serious medical issues or chronic medical problems, these are probably not your best option.  No Primary Care Doctor: - Call Health Connect at  [phone] - they can help you locate a primary care doctor that  accepts your insurance, provides certain services, etc. - Physician Referral Service- [phone]  Chronic Pain Problems: Organization         Address  Phone   Notes  [facility]  [phone] Patients need to be referred by their primary care doctor.   Medication Assistance: Organization         Address  Phone   Notes  [facility] Medication [facility] [address] [place], [place] [address] [phone] --Must be a resident of [facility] -- Must have NO insurance coverage whatsoever (no Medicaid/ Medicare, etc.) -- The pt. MUST have a primary care doctor that directs their care regularly and follows them in the community   MedAssist  [phone]   [employer]  [phone]    Agencies that provide inexpensive medical care: Organization         Address  Phone   Notes  [facility]  [phone]   [facility]    [phone]   [facility] [address] [place], [place] [address] [phone]   [facility]  Lovenia Shuck, [place] [phone]   [facility]anned Parenthood    [phone]   [facility]    [phone]   [facility]  [address], [place] Phone:  [phone], Fax:  [phone] Hours of Operation:  9 am - 6 pm, M-F.  Also accepts Medicaid/Medicare and self-pay.  [facility]  [address], [place] Phone: [phone], Fax: [phone]. Hours of Operation:  8:30 am - 5:30 pm, M-F.  Also accepts Medicaid and self-pay.  [facility]  [place] [address], [place] Phone: [phone]   [facility] [address] Natasha Bence [place], [place] [phone], Ext. [phone] Mondays & Thursdays: 7-9 AM.  First 15 patients are seen on a first come, first serve basis.    Medicaid-accepting [facility] Providers:  Organization         Address  Phone   Notes  [facility] [address],  [phone] Also accepts self-pay patients.  [facility] [address] Laurell Josephs [place], [place]  [phone]   [facility] [address], [place] [phone]   [facility] [address] [place] [phone]   Renaye Rakers [address], [place]   [phone] Only accepts [place] Access [place] patients after they have their name applied to their card.   Self-Pay (no insurance) in [facility]:  Organization         Address  Phone   Notes  Sickle Cell Patients, [facility] [address] [place], [place] [phone]   [facility] [address] [place], [place] [phone]   [facility]  [address], [place] [phone]   [facility]/Dr. Osei-Bonsu  [address] [place] or [address], [place] [phone] Phone number for both [place] and [place] locations is the same.  [facility] [address] [place] [phone]   [facility] [address], [place] or [address] [phone]   [facility] [address], [place] [phone], phone; [phone], fax Sees patients 1st and 3rd Saturday of every month.  Must not qualify for public or private insurance (i.e. Medicaid, Medicare, [place] Health Choice, Veterans' Benefits)  Household income should be no more than 200%  of the poverty level The clinic cannot treat you if you are pregnant or think you are pregnant  Sexually transmitted diseases are not treated at the clinic.    Dental Care: Organization         Address  Phone  Notes  [facility] [address] [place], [place] [phone] Accepts children up to age 48 who are enrolled in [place] or [place] Health Choice; pregnant women with a Medicaid card; and children who have applied for Medicaid or [place] Health Choice, but were declined, whose parents can pay a reduced fee at time of service.  [facility] Department of [facility]  [address], [place] [phone] Accepts children up to age 25 who are enrolled in [place] or [place] Health Choice; pregnant women with a Medicaid card; and children who have applied for  Medicaid or [place] Health Choice, but were declined, whose parents can pay a reduced fee at time of service.  [facility]  [address] [place], [place] [phone] Patients are seen by appointment only. Walk-ins are not accepted. [facility] will see patients 18 years of age and older. Monday - Tuesday (8am-5pm) Most Wednesdays (8:30-5pm) $30 per visit, cash only  [facility] Adult Dental Access PRO[facility]  [address], [facility] [phone] Patients are seen by appointment only. Walk-ins are not accepted. [facility] will see patients 62 years of age and older. One Wednesday Evening (Monthly: Volunteer Based).  $30 per visit, cash only  [employer]  [phone] for adults; Children under age 31, call Graduate Pediatric Dentistry at [phone]. Children aged 49-14, please call [phone] to request a pediatric application.  Dental services are provided in all areas of dental care including fillings, crowns and bridges, complete and partial dentures, implants, gum treatment, root canals, and  extractions. Preventive care is also provided. Treatment is provided to both adults and children. Patients are selected via a lottery and there is often a waiting list.   [facility] [address], [place]  [phone] [email or web address]   [facility] [address] [place], [place] [phone], Ext. [phone] Second and Fourth Thursday of each month, opens at 6:30 AM; Clinic ends at 9 AM.  Patients are seen on a first-come first-served basis, and a limited number are seen during each clinic.   [facility]  [address] Ether Griffins [place], [place] [phone]   Eligibility Requirements You must have lived in [place], [place], or [place] counties for at least the last three months.   You cannot be eligible for state or federal sponsored [place], including [employer], [place], or [employer].   You generally cannot be eligible for healthcare insurance through your employer.    How to apply: Eligibility screenings are held every Tuesday and Wednesday afternoon from 1:00 pm until 4:00 pm. You do not need an appointment for the interview!  [facility] [address], [place], [place] [phone]   [facility] Health Department  [phone]   [facility] Health Department  [phone]   [facility] Health Department  [phone]    Behavioral Health Resources in the Community: Intensive Outpatient Programs Organization         Address  Phone  Notes  [facility] [address], [place], [place] [phone]   [facility] Outpatient [address] [place], [place] [phone]   [facility] [address], [place], [place]  [phone]   [facility] [address],  [place], [place] [phone] or [phone]   Substance Abuse Resources Organization         Address  Phone  Notes  [facility]  [phone]     [facility]  [phone]   [facility]  [phone]   Floydene Flock  [phone]   Residential & Outpatient Substance Abuse Program  [phone]   Psychological Services Organization         Address  Phone  Notes  [facility] Behavioral Health  [phone]   [facility]  [phone]   [facility] Mental Health [address], [place] [phone] or [phone]    Mobile Crisis Teams Organization  Address  Phone  Notes  [facility], Mobile Crisis Care Unit  [phone]   [facility]  [address] [place], [place] [phone]   [facility] [address] [place] [phone]    Self-Help/Support Groups Organization         Address  Phone             Notes  [facility] [place] - variety of support groups  336- I7437963 Call for more information  Narcotics Anonymous (NA), [facility] [address], [employer] [place]  2 meetings at this location   Statistician         Address  Phone  Notes  [facility] [address],    [place]  [phone]   [facility]  [address], [place] [address], [place], [place] [phone]   [facility] [address] [place], [place] [phone] Admissions: 8am-3pm M-F  [facility] [address],    [place], [place] [phone]   [facility] [address] [place], [place], [place] [phone]   [facility] [address],  [place], [place] [phone]   [facility] - Intensive Outpatient [address] Laurell Josephs [address], [place], [place] [phone]   [facility] ([facility] [address] [place]  [place], [place] [phone] or [phone]   [facility] ([facility]) [address], [place], [place] [phone] Accepts Medicaid  [facility] [address]   [place] [phone] Substance Abuse/Addiction Treatment   [facility] Organization         Address  Phone  Notes  [facility]  [phone]   Angie Fava, PhD [address] Ervin Knack [place], [place]   [phone] or [phone]   [facility]   [address] [place], [place] [phone]   [facility] [address], [place], [place] [phone] Insurance/Medicaid/sponsorship through [facility] [address]                                    [place], [place] [phone] Therapy/tele-psych/case  [facility] [address], [place] [phone]    Dr. Lolly Mustache  [phone]   [facility]  [employer] [facility] Dept. 1) [address], [place] 2) [address] [place] 3)  [address], [place] [phone]  [phone]   [facility] Child Abuse Hotline [phone] or [phone] (After Hours)
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
## 2015-10-01 ENCOUNTER — Encounter (HOSPITAL_COMMUNITY): Payer: Self-pay | Admitting: Family Medicine

## 2015-10-01 ENCOUNTER — Emergency Department (HOSPITAL_COMMUNITY): Payer: Medicaid Other

## 2015-10-01 ENCOUNTER — Emergency Department (HOSPITAL_COMMUNITY)
Admission: EM | Admit: 2015-10-01 | Discharge: 2015-10-01 | Disposition: A | Discharge status: Home / Self Care | Payer: Medicaid Other | Attending: Emergency Medicine | Admitting: Emergency Medicine

## 2015-10-01 DIAGNOSIS — M1990 Unspecified osteoarthritis, unspecified site: Secondary | ICD-10-CM | POA: Insufficient documentation

## 2015-10-01 DIAGNOSIS — J45909 Unspecified asthma, uncomplicated: Secondary | ICD-10-CM | POA: Insufficient documentation

## 2015-10-01 DIAGNOSIS — Y998 Other external cause status: Secondary | ICD-10-CM | POA: Insufficient documentation

## 2015-10-01 DIAGNOSIS — Y9289 Other specified places as the place of occurrence of the external cause: Secondary | ICD-10-CM | POA: Diagnosis not present

## 2015-10-01 DIAGNOSIS — S52592A Other fractures of lower end of left radius, initial encounter for closed fracture: Secondary | ICD-10-CM | POA: Diagnosis not present

## 2015-10-01 DIAGNOSIS — Y9389 Activity, other specified: Secondary | ICD-10-CM | POA: Diagnosis not present

## 2015-10-01 DIAGNOSIS — S59912A Unspecified injury of left forearm, initial encounter: Secondary | ICD-10-CM | POA: Diagnosis present

## 2015-10-01 DIAGNOSIS — X58XXXA Exposure to other specified factors, initial encounter: Secondary | ICD-10-CM | POA: Diagnosis not present

## 2015-10-01 DIAGNOSIS — S52502A Unspecified fracture of the lower end of left radius, initial encounter for closed fracture: Secondary | ICD-10-CM

## 2015-10-01 DIAGNOSIS — T148XXA Other injury of unspecified body region, initial encounter: Secondary | ICD-10-CM

## 2015-10-01 LAB — CBC
HCT: 38.1 % (ref 36.0–46.0)
Hemoglobin: 12.4 g/dL (ref 12.0–15.0)
MCH: 27.5 pg (ref 26.0–34.0)
MCHC: 32.5 g/dL (ref 30.0–36.0)
MCV: 84.5 fL (ref 78.0–100.0)
PLATELETS: 325 10*3/uL (ref 150–400)
RBC: 4.51 MIL/uL (ref 3.87–5.11)
RDW: 14.2 % (ref 11.5–15.5)
WBC: 9.1 10*3/uL (ref 4.0–10.5)

## 2015-10-01 LAB — BASIC METABOLIC PANEL
Anion gap: 9 (ref 5–15)
BUN: 10 mg/dL (ref 6–20)
CHLORIDE: 107 mmol/L (ref 101–111)
CO2: 24 mmol/L (ref 22–32)
CREATININE: 0.86 mg/dL (ref 0.44–1.00)
Calcium: 8.5 mg/dL — ABNORMAL LOW (ref 8.9–10.3)
GFR calc non Af Amer: >60 mL/min (ref >60)
Glucose, Bld: 121 mg/dL — ABNORMAL HIGH (ref 65–99)
Potassium: 3.8 mmol/L (ref 3.5–5.1)
SODIUM: 140 mmol/L (ref 135–145)

## 2015-10-01 MED ORDER — MIDAZOLAM HCL 2 MG/2ML IJ SOLN: 2.0000 mg | Freq: Once | INTRAMUSCULAR | Status: DC

## 2015-10-01 MED ORDER — KETAMINE HCL-SODIUM CHLORIDE 100-0.9 MG/10ML-% IV SOSY: 1.0000 mg/kg | PREFILLED_SYRINGE | Freq: Once | INTRAVENOUS | Status: DC

## 2015-10-01 MED ORDER — OXYCODONE-ACETAMINOPHEN 5-325 MG PO TABS: 1.0000 | ORAL_TABLET | ORAL | Status: DC | PRN

## 2015-10-01 MED ORDER — ACETAMINOPHEN 500 MG PO TABS
1000.0000 mg | ORAL_TABLET | Freq: Once | ORAL | Status: AC
  Administered 2015-10-01: 1000 mg via ORAL
  Filled 2015-10-01: qty 2

## 2015-10-01 MED ORDER — IBUPROFEN 800 MG PO TABS: 800.0000 mg | ORAL_TABLET | Freq: Three times a day (TID) | ORAL | Status: DC

## 2015-10-01 MED ORDER — OXYCODONE-ACETAMINOPHEN 5-325 MG PO TABS: 1.0000 | ORAL_TABLET | Freq: Once | ORAL | Status: DC

## 2015-10-01 MED ORDER — OXYCODONE-ACETAMINOPHEN 5-325 MG PO TABS
ORAL_TABLET | ORAL | Status: AC
  Filled 2015-10-01: qty 1

## 2015-10-01 MED ORDER — MORPHINE SULFATE (PF) 4 MG/ML IV SOLN
4.0000 mg | Freq: Once | INTRAVENOUS | Status: DC
Start: 2015-10-01 — End: 2015-10-01

## 2015-10-01 MED ORDER — ONDANSETRON HCL 4 MG/2ML IJ SOLN: 4.0000 mg | Freq: Once | INTRAMUSCULAR | Status: DC

## 2015-10-01 MED ORDER — OXYCODONE-ACETAMINOPHEN 5-325 MG PO TABS
1.0000 | ORAL_TABLET | Freq: Once | ORAL | Status: AC
  Administered 2015-10-01: 1 via ORAL

## 2015-10-01 MED ORDER — OXYCODONE-ACETAMINOPHEN 5-325 MG PO TABS
1.0000 | ORAL_TABLET | Freq: Once | ORAL | Status: AC
  Administered 2015-10-01: 1 via ORAL
  Filled 2015-10-01: qty 1

## 2015-10-01 NOTE — ED Notes (Signed)
Paged ortho. Waiting for splint to be applied before dc.

## 2015-10-01 NOTE — ED Notes (Signed)
Pt is in stable condition upon d/c and ambulates from ED. 
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
## 2015-10-01 NOTE — Discharge Instructions (Signed)
You have been seen today for a wrist injury. You have a wrist fracture that may require further treatment. The orthopedic surgeon's office should call you tomorrow to set up an appointment.  If their office does not contact you by around 10 am tomorrow, please call the number listed in these instructions and set up an appointment. Follow up with PCP as needed. Return to ED should symptoms worsen.

## 2015-10-01 NOTE — ED Notes (Signed)
Meal given to PT son.

## 2015-10-01 NOTE — ED Notes (Signed)
[employer] in PT room to review information with PT. [employer] took photo of Lt wrist .

## 2015-10-01 NOTE — ED Notes (Signed)
Pt only given 1 percocet

## 2015-10-01 NOTE — ED Notes (Signed)
Will hold to do procedural sedation and reduction until Dr. Rosalia Hammers speaks with hand surgeon per [place], [place].

## 2015-10-01 NOTE — Progress Notes (Signed)
Orthopedic Tech Progress Note Patient Details:  Sheri Hall Apr 02, 1971 [id number]  Ortho Devices Type of Ortho Device: Ace wrap, Arm sling, Sugartong splint Ortho Device/Splint Location: LUE Ortho Device/Splint Interventions: Ordered, Application   Jennye Moccasin 10/01/2015, 6:11 PM

## 2015-10-01 NOTE — ED Notes (Signed)
Pt here for arm injury. Pt left wrist swollen and unable to move.

## 2015-10-01 NOTE — ED Notes (Signed)
Patient transported to CT 

## 2015-10-01 NOTE — ED Notes (Signed)
TC to [facility] report given

## 2015-10-11 ENCOUNTER — Other Ambulatory Visit: Payer: Self-pay | Admitting: Orthopedic Surgery

## 2015-10-12 ENCOUNTER — Encounter (HOSPITAL_BASED_OUTPATIENT_CLINIC_OR_DEPARTMENT_OTHER): Payer: Self-pay | Admitting: *Deleted
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
## 2015-10-12 NOTE — Progress Notes (Signed)
Bring all medications. Discussed with Dr. Renold Don pt had relapse on Wednesday -did cocaine, plan to do urine drug screen before surgery Monday.

## 2015-10-14 NOTE — Anesthesia Preprocedure Evaluation (Deleted)
Anesthesia Evaluation  Patient identified by MRN, date of birth, ID band Patient awake    Reviewed: Allergy & Precautions, NPO status , Patient's Chart, lab work & pertinent test results  History of Anesthesia Complications Negative for: history of anesthetic complications  Airway        Dental   Pulmonary asthma , Current Smoker,           Cardiovascular negative cardio ROS       Neuro/Psych PSYCHIATRIC DISORDERS Anxiety Depression Bipolar Disorder negative neurological ROS     GI/Hepatic GERD  Medicated and Controlled,(+)     substance abuse  cocaine use,   Endo/Other  negative endocrine ROS  Renal/GU negative Renal ROS  negative genitourinary   Musculoskeletal  (+) Arthritis ,   Abdominal   Peds negative pediatric ROS (+)  Hematology negative hematology ROS (+)   Anesthesia Other Findings   Reproductive/Obstetrics negative OB ROS                             Anesthesia Physical Anesthesia Plan  ASA: III  Anesthesia Plan: General   Post-op Pain Management: GA combined w/ Regional for post-op pain   Induction: Intravenous  Airway Management Planned: LMA  Additional Equipment:   Intra-op Plan:   Post-operative Plan: Extubation in OR  Informed Consent: I have reviewed the patients History and Physical, chart, labs and discussed the procedure including the risks, benefits and alternatives for the proposed anesthesia with the patient or authorized representative who has indicated his/her understanding and acceptance.   Dental advisory given  Plan Discussed with: CRNA  Anesthesia Plan Comments:         Anesthesia Quick Evaluation

## 2015-10-15 ENCOUNTER — Ambulatory Visit (HOSPITAL_BASED_OUTPATIENT_CLINIC_OR_DEPARTMENT_OTHER): Payer: Medicaid Other | Admitting: Certified Registered"

## 2015-10-15 ENCOUNTER — Ambulatory Visit (HOSPITAL_BASED_OUTPATIENT_CLINIC_OR_DEPARTMENT_OTHER)
Admission: RE | Admit: 2015-10-15 | Discharge: 2015-10-15 | Disposition: A | Discharge status: Home / Self Care | Payer: Medicaid Other | Source: Ambulatory Visit | Attending: Orthopedic Surgery | Admitting: Orthopedic Surgery

## 2015-10-15 ENCOUNTER — Ambulatory Visit (HOSPITAL_COMMUNITY): Payer: Medicaid Other

## 2015-10-15 ENCOUNTER — Encounter (HOSPITAL_BASED_OUTPATIENT_CLINIC_OR_DEPARTMENT_OTHER): Payer: Self-pay | Admitting: Certified Registered"

## 2015-10-15 ENCOUNTER — Encounter (HOSPITAL_BASED_OUTPATIENT_CLINIC_OR_DEPARTMENT_OTHER)
Admission: RE | Disposition: A | Discharge status: Home / Self Care | Payer: Self-pay | Source: Ambulatory Visit | Attending: Orthopedic Surgery

## 2015-10-15 DIAGNOSIS — X58XXXA Exposure to other specified factors, initial encounter: Secondary | ICD-10-CM | POA: Insufficient documentation

## 2015-10-15 DIAGNOSIS — F17210 Nicotine dependence, cigarettes, uncomplicated: Secondary | ICD-10-CM | POA: Diagnosis not present

## 2015-10-15 DIAGNOSIS — M069 Rheumatoid arthritis, unspecified: Secondary | ICD-10-CM | POA: Diagnosis not present

## 2015-10-15 DIAGNOSIS — S52562A Barton's fracture of left radius, initial encounter for closed fracture: Secondary | ICD-10-CM | POA: Insufficient documentation

## 2015-10-15 DIAGNOSIS — S62109A Fracture of unspecified carpal bone, unspecified wrist, initial encounter for closed fracture: Secondary | ICD-10-CM

## 2015-10-15 HISTORY — PX: OPEN REDUCTION INTERNAL FIXATION (ORIF) DISTAL RADIAL FRACTURE: SHX5989

## 2015-10-15 HISTORY — DX: Obsessive-compulsive disorder, unspecified: F42.9

## 2015-10-15 HISTORY — DX: Anxiety disorder, unspecified: F41.9

## 2015-10-15 HISTORY — DX: Post-traumatic stress disorder, unspecified: F43.10

## 2015-10-15 HISTORY — DX: Gastro-esophageal reflux disease without esophagitis: K21.9

## 2015-10-15 HISTORY — DX: Bipolar disorder, unspecified: F31.9

## 2015-10-15 HISTORY — DX: Depression, unspecified: F32.A

## 2015-10-15 HISTORY — DX: Major depressive disorder, single episode, unspecified: F32.9

## 2015-10-15 LAB — GLUCOSE, CAPILLARY: Glucose-Capillary: 90 mg/dL (ref 65–99)

## 2015-10-15 SURGERY — OPEN REDUCTION INTERNAL FIXATION (ORIF) DISTAL RADIUS FRACTURE
Anesthesia: General | Site: Wrist | Laterality: Left

## 2015-10-15 MED ORDER — SUCCINYLCHOLINE CHLORIDE 20 MG/ML IJ SOLN
INTRAMUSCULAR | Status: AC
  Filled 2015-10-15: qty 1

## 2015-10-15 MED ORDER — GLYCOPYRROLATE 0.2 MG/ML IJ SOLN: 0.2000 mg | Freq: Once | INTRAMUSCULAR | Status: DC | PRN

## 2015-10-15 MED ORDER — ONDANSETRON HCL 4 MG/2ML IJ SOLN
INTRAMUSCULAR | Status: AC
  Filled 2015-10-15: qty 2

## 2015-10-15 MED ORDER — DOCUSATE SODIUM 100 MG PO CAPS: 100.0000 mg | ORAL_CAPSULE | Freq: Every day | ORAL | Status: DC | PRN

## 2015-10-15 MED ORDER — LACTATED RINGERS IV SOLN: INTRAVENOUS | Status: DC

## 2015-10-15 MED ORDER — OXYCODONE-ACETAMINOPHEN 5-325 MG PO TABS: 1.0000 | ORAL_TABLET | Freq: Four times a day (QID) | ORAL | Status: DC | PRN

## 2015-10-15 MED ORDER — ONDANSETRON HCL 4 MG/2ML IJ SOLN
INTRAMUSCULAR | Status: DC | PRN
  Administered 2015-10-15: 4 mg via INTRAVENOUS

## 2015-10-15 MED ORDER — EPHEDRINE SULFATE 50 MG/ML IJ SOLN
INTRAMUSCULAR | Status: DC | PRN
  Administered 2015-10-15: 10 mg via INTRAVENOUS
  Administered 2015-10-15: 5 mg via INTRAVENOUS

## 2015-10-15 MED ORDER — ONDANSETRON HCL 4 MG/2ML IJ SOLN
4.0000 mg | Freq: Once | INTRAMUSCULAR | Status: DC | PRN
Start: 2015-10-15 — End: 2015-10-15

## 2015-10-15 MED ORDER — LIDOCAINE HCL (CARDIAC) 20 MG/ML IV SOLN
INTRAVENOUS | Status: DC | PRN
Start: 2015-10-15 — End: 2015-10-15
  Administered 2015-10-15: 30 mg via INTRAVENOUS

## 2015-10-15 MED ORDER — FENTANYL CITRATE (PF) 100 MCG/2ML IJ SOLN
50.0000 ug | INTRAMUSCULAR | Status: DC | PRN
  Administered 2015-10-15: 100 ug via INTRAVENOUS

## 2015-10-15 MED ORDER — CEFAZOLIN SODIUM-DEXTROSE 2-4 GM/100ML-% IV SOLN
2.0000 g | INTRAVENOUS | Status: AC
  Administered 2015-10-15: 2 g via INTRAVENOUS

## 2015-10-15 MED ORDER — MIDAZOLAM HCL 2 MG/2ML IJ SOLN
1.0000 mg | INTRAMUSCULAR | Status: DC | PRN
  Administered 2015-10-15 (×2): 2 mg via INTRAVENOUS

## 2015-10-15 MED ORDER — MIDAZOLAM HCL 2 MG/2ML IJ SOLN
INTRAMUSCULAR | Status: AC
  Filled 2015-10-15: qty 2

## 2015-10-15 MED ORDER — FENTANYL CITRATE (PF) 100 MCG/2ML IJ SOLN: 25.0000 ug | INTRAMUSCULAR | Status: DC | PRN

## 2015-10-15 MED ORDER — CEFAZOLIN SODIUM-DEXTROSE 2-4 GM/100ML-% IV SOLN
INTRAVENOUS | Status: AC
  Filled 2015-10-15: qty 100

## 2015-10-15 MED ORDER — BUPIVACAINE-EPINEPHRINE (PF) 0.5% -1:200000 IJ SOLN
INTRAMUSCULAR | Status: DC | PRN
  Administered 2015-10-15: 30 mL

## 2015-10-15 MED ORDER — PROPOFOL 10 MG/ML IV BOLUS
INTRAVENOUS | Status: DC | PRN
  Administered 2015-10-15: 150 mg via INTRAVENOUS

## 2015-10-15 MED ORDER — LIDOCAINE HCL (CARDIAC) 20 MG/ML IV SOLN
INTRAVENOUS | Status: AC
  Filled 2015-10-15: qty 5

## 2015-10-15 MED ORDER — PROPOFOL 500 MG/50ML IV EMUL
INTRAVENOUS | Status: AC
  Filled 2015-10-15: qty 50

## 2015-10-15 MED ORDER — SCOPOLAMINE 1 MG/3DAYS TD PT72: 1.0000 | MEDICATED_PATCH | Freq: Once | TRANSDERMAL | Status: DC | PRN

## 2015-10-15 MED ORDER — FENTANYL CITRATE (PF) 100 MCG/2ML IJ SOLN
INTRAMUSCULAR | Status: AC
  Filled 2015-10-15: qty 2

## 2015-10-15 MED ORDER — LACTATED RINGERS IV SOLN
INTRAVENOUS | Status: DC
Start: 2015-10-15 — End: 2015-10-15
  Administered 2015-10-15 (×2): via INTRAVENOUS

## 2015-10-15 MED ORDER — 0.9 % SODIUM CHLORIDE (POUR BTL) OPTIME
TOPICAL | Status: DC | PRN
  Administered 2015-10-15: 1000 mL

## 2015-10-15 MED ORDER — DEXAMETHASONE SODIUM PHOSPHATE 10 MG/ML IJ SOLN
INTRAMUSCULAR | Status: AC
  Filled 2015-10-15: qty 1

## 2015-10-15 MED ORDER — DEXAMETHASONE SODIUM PHOSPHATE 10 MG/ML IJ SOLN
INTRAMUSCULAR | Status: DC | PRN
  Administered 2015-10-15: 10 mg via INTRAVENOUS

## 2015-10-15 SURGICAL SUPPLY — 73 items
BANDAGE COBAN STERILE 2 (GAUZE/BANDAGES/DRESSINGS) IMPLANT
BIT DRILL SOLID 2.0X40MM (BIT) ×1 IMPLANT
BIT DRILL SOLID 2.5X40MM (BIT) ×1 IMPLANT
BLADE MINI RND TIP GREEN BEAV (BLADE) IMPLANT
BLADE SURG 15 STRL LF DISP TIS (BLADE) ×2 IMPLANT
BLADE SURG 15 STRL SS (BLADE) ×2
BNDG COHESIVE 4X5 TAN STRL (GAUZE/BANDAGES/DRESSINGS) ×2 IMPLANT
BNDG ESMARK 4X9 LF (GAUZE/BANDAGES/DRESSINGS) ×2 IMPLANT
BNDG GAUZE ELAST 4 BULKY (GAUZE/BANDAGES/DRESSINGS) ×4 IMPLANT
BRUSH SCRUB EZ PLAIN DRY (MISCELLANEOUS) ×2 IMPLANT
CANISTER SUCT 1200ML W/VALVE (MISCELLANEOUS) ×2 IMPLANT
CHLORAPREP W/TINT 26ML (MISCELLANEOUS) ×2 IMPLANT
CORDS BIPOLAR (ELECTRODE) ×2 IMPLANT
COVER BACK TABLE 60X90IN (DRAPES) ×2 IMPLANT
COVER MAYO STAND STRL (DRAPES) ×2 IMPLANT
CUFF TOURNIQUET SINGLE 18IN (TOURNIQUET CUFF) ×2 IMPLANT
CUFF TOURNIQUET SINGLE 24IN (TOURNIQUET CUFF) IMPLANT
DRAPE C-ARM 42X72 X-RAY (DRAPES) ×2 IMPLANT
DRAPE EXTREMITY T 121X128X90 (DRAPE) ×2 IMPLANT
DRAPE SURG 17X23 STRL (DRAPES) ×2 IMPLANT
DRILL SOLID 2.0X40MM (BIT) ×2
DRILL SOLID 2.5X40MM (BIT) ×2
DRSG ADAPTIC 3X8 NADH LF (GAUZE/BANDAGES/DRESSINGS) ×2 IMPLANT
DRSG EMULSION OIL 3X3 NADH (GAUZE/BANDAGES/DRESSINGS) IMPLANT
ELECT REM PT RETURN 9FT ADLT (ELECTROSURGICAL) ×2
ELECTRODE REM PT RTRN 9FT ADLT (ELECTROSURGICAL) ×1 IMPLANT
GAUZE SPONGE 4X4 12PLY STRL (GAUZE/BANDAGES/DRESSINGS) ×2 IMPLANT
GLOVE BIO SURGEON STRL SZ7.5 (GLOVE) ×2 IMPLANT
GLOVE BIOGEL PI IND STRL 7.0 (GLOVE) ×2 IMPLANT
GLOVE BIOGEL PI IND STRL 8 (GLOVE) ×1 IMPLANT
GLOVE BIOGEL PI INDICATOR 7.0 (GLOVE) ×2
GLOVE BIOGEL PI INDICATOR 8 (GLOVE) ×1
GLOVE ECLIPSE 6.5 STRL STRAW (GLOVE) ×4 IMPLANT
GLOVE EXAM NITRILE MD LF STRL (GLOVE) ×2 IMPLANT
GOWN STRL REUS W/ TWL LRG LVL3 (GOWN DISPOSABLE) ×2 IMPLANT
GOWN STRL REUS W/TWL LRG LVL3 (GOWN DISPOSABLE) ×2
GOWN STRL REUS W/TWL XL LVL3 (GOWN DISPOSABLE) ×2 IMPLANT
NEEDLE HYPO 25X1 1.5 SAFETY (NEEDLE) IMPLANT
NS IRRIG 1000ML POUR BTL (IV SOLUTION) ×2 IMPLANT
PACK BASIN DAY SURGERY FS (CUSTOM PROCEDURE TRAY) ×2 IMPLANT
PADDING CAST ABS 4INX4YD NS (CAST SUPPLIES) ×1
PADDING CAST ABS COTTON 4X4 ST (CAST SUPPLIES) ×1 IMPLANT
PEG GEMINUS SMOOTH LOCK 2.0X19 (Peg) ×2 IMPLANT
PEG SMOOTH LOCK 2.0X16 (Screw) ×8 IMPLANT
PEG SMOOTH LOCK 2.0X18 (Screw) ×2 IMPLANT
PEG THREADED LOCK 2.3X16 (Screw) ×2 IMPLANT
PENCIL BUTTON HOLSTER BLD 10FT (ELECTRODE) ×2 IMPLANT
PLATE STD 3H LEFT (Plate) ×2 IMPLANT
RUBBERBAND STERILE (MISCELLANEOUS) IMPLANT
SCREW CORT LOCK 3.5X10 TI (Screw) ×2 IMPLANT
SCREW GEMINUS CORT LOCK 3.5X11 (Screw) ×2 IMPLANT
SCREW POLY NON LOCK 3.5MMX12MM (Screw) ×2 IMPLANT
SLEEVE SCD COMPRESS KNEE MED (MISCELLANEOUS) ×2 IMPLANT
SLING ARM FOAM STRAP LRG (SOFTGOODS) IMPLANT
SLING ARM FOAM STRAP MED (SOFTGOODS) ×2 IMPLANT
SPLINT PLASTER CAST XFAST 3X15 (CAST SUPPLIES) IMPLANT
SPLINT PLASTER XTRA FASTSET 3X (CAST SUPPLIES)
STOCKINETTE 6  STRL (DRAPES) ×1
STOCKINETTE 6 STRL (DRAPES) ×1 IMPLANT
SUCTION FRAZIER HANDLE 10FR (MISCELLANEOUS) ×1
SUCTION TUBE FRAZIER 10FR DISP (MISCELLANEOUS) ×1 IMPLANT
SUT VIC AB 2-0 PS2 27 (SUTURE) ×2 IMPLANT
SUT VICRYL 4-0 PS2 18IN ABS (SUTURE) ×2 IMPLANT
SUT VICRYL RAPIDE 4-0 (SUTURE) IMPLANT
SUT VICRYL RAPIDE 4/0 PS 2 (SUTURE) ×2 IMPLANT
SYR BULB 3OZ (MISCELLANEOUS) ×2 IMPLANT
SYRINGE 10CC LL (SYRINGE) IMPLANT
TOWEL OR 17X24 6PK STRL BLUE (TOWEL DISPOSABLE) ×4 IMPLANT
TOWEL OR NON WOVEN STRL DISP B (DISPOSABLE) IMPLANT
TUBE CONNECTING 20X1/4 (TUBING) ×2 IMPLANT
UNDERPAD 30X30 (UNDERPADS AND DIAPERS) ×2 IMPLANT
WIRE FIX 1.5 STANDARD TIP (WIRE) ×2
WIRE FIX 1.5 STD TIP (WIRE) ×1 IMPLANT

## 2015-10-15 NOTE — Op Note (Signed)
10/15/2015  7:26 AM  PATIENT:  Sheri Hall  45 y.o. female  PRE-OPERATIVE DIAGNOSIS:  Comminuted left intra-articular distal radius fracture  POST-OPERATIVE DIAGNOSIS:  Same  PROCEDURE:  ORIF comminuted intra-articular distal radius fracture  SURGEON: Cliffton Asters. Janee Morn, MD  PHYSICIAN ASSISTANT: Danielle Rankin, OPA-C  ANESTHESIA:  regional and general  SPECIMENS:  None  DRAINS: None  EBL:  less than 50 mL  PREOPERATIVE INDICATIONS:  [name] is a  45 y.o. female with a comminuted, displaced volar barton's fracture and associated joint subluxation  The risks benefits and alternatives were discussed with the patient preoperatively including but not limited to the risks of infection, bleeding, nerve injury, cardiopulmonary complications, the need for revision surgery, among others, and the patient verbalized understanding and consented to proceed.  OPERATIVE IMPLANTS: [employer] distal radius plate with screws/pegs  OPERATIVE PROCEDURE: After receiving prophylactic antibiotics, the patient was escorted to the operative theatre and placed in a supine position. General anesthesia was administered.  A surgical "time-out" was performed during which the planned procedure, proposed operative site, and the correct patient identity were compared to the operative consent and agreement confirmed by the circulating nurse according to current facility policy. Following application of a tourniquet to the operative extremity, the exposed skin was pre-scrub with Hibiclens scrub brush and then was prepped with Chloraprep and draped in the usual sterile fashion. The limb was exsanguinated with an Esmarch bandage and the tourniquet inflated to approximately higher than systolic BP.   A sinusoidal-shaped incision was marked and made over the FCR axis and the distal forearm. The skin was incised sharply with scalpel, subcutaneous tissues with blunt and spreading dissection. The FCR  axis was exploited deeply. The pronator quadratus was reflected in an L-shaped ulnarly and the brachioradialis was split in a Z-plasty fashion for later reapproximation. The fracture was inspected and provisionally reduced.  This was confirmed fluoroscopically. The appropriately sized plate was selected and found to fit well. It was placed in its provisional alignment of the radius and this was confirmed fluoroscopically.  It was secured to the radius with a screw through the slotted hole.  Additional adjustments were made as necessary, and the distal holes were all drilled and filled.  Peg/screw length distally was selected on the shorter side of measurements to minimize the risk for dorsal cortical penetration. The remainder of the proximal holes were drilled and filled.   Final images were obtained and the DRUJ was examined for stability. It was found to be sufficiently stable. The wound was then copiously irrigated and the brachioradialis repaired with 2-0 Vicryl Rapide suture followed by repair of the pronator quadratus with the same suture type. Tourniquet was released and additional hemostasis obtained and the skin was closed with 2-0 Vicryl deep dermal buried sutures followed by running 4-0 Vicryl Rapide horizontal mattress suture in the skin. A bulky dressing with a volar plaster component was applied and she was taken to room stable condition.  DISPOSITION: The patient will be discharged home today with typical post-op instructions, returning in 10-15 days for reevaluation with new x-rays of the affected wrist out of the splint to include an inclined lateral and then transition to therapy to have a custom splint constructed and begin rehabilitation.

## 2015-10-15 NOTE — Interval H&P Note (Signed)
History and Physical Interval Note:  10/15/2015 7:26 AM  Sheri Hall  has presented today for surgery, with the diagnosis of LEFT DISTAL RADIUS FRACTURE S52.572A  The various methods of treatment have been discussed with the patient and family. After consideration of risks, benefits and other options for treatment, the patient has consented to  Procedure(s) with comments: OPEN TREATMENT OF LEFT DISTAL RADIUS FRACTURE (Left) - GENERAL ANESTHESIA WITH PRE-OP BLOCK as a surgical intervention .  The patient's history has been reviewed, patient examined, no change in status, stable for surgery.  I have reviewed the patient's chart and labs.  Questions were answered to the patient's satisfaction.     Geraldyn Shain A.

## 2015-10-15 NOTE — Anesthesia Postprocedure Evaluation (Signed)
Anesthesia Post Note  Patient: Sheri Hall  Procedure(s) Performed: Procedure(s) (LRB): OPEN TREATMENT OF LEFT DISTAL RADIUS FRACTURE (Left)  Patient location during evaluation: PACU Anesthesia Type: General and Regional Level of consciousness: awake and alert Pain management: pain level controlled Vital Signs Assessment: post-procedure vital signs reviewed and stable Respiratory status: spontaneous breathing, nonlabored ventilation, respiratory function stable and patient connected to nasal cannula oxygen Cardiovascular status: blood pressure returned to baseline and stable Postop Assessment: no signs of nausea or vomiting Anesthetic complications: no Comments: Was called once in phase II about patient becoming dizzy when they tried to get her up. Vitals were retaken and stable. BS taken and WNL. Patient was awake, alert, oriented and having no symptoms of pain, dizziness, numbness except in her arm that was blocked, and reported feeling good.     Last Vitals:  Filed Vitals:   10/15/15 0945 10/15/15 1000  BP:  98/62  Pulse: 76 58  Temp:    Resp: 13 13    Last Pain:  Filed Vitals:   10/15/15 1006  PainSc: Asleep                 [name]

## 2015-10-15 NOTE — Progress Notes (Signed)
Assisted Dr. Mary Judd with left, ultrasound guided, supraclavicular block. Side rails up, monitors on throughout procedure. See vital signs in flow sheet. Tolerated Procedure well. 

## 2015-10-15 NOTE — Anesthesia Preprocedure Evaluation (Signed)
Anesthesia Evaluation  Patient identified by MRN, date of birth, ID band Patient awake    Reviewed: Allergy & Precautions, NPO status , Patient's Chart, lab work & pertinent test results  History of Anesthesia Complications Negative for: history of anesthetic complications  Airway Mallampati: II  TM Distance: >3 FB Neck ROM: Full    Dental no notable dental hx. (+) Poor Dentition, Chipped, Missing   Pulmonary asthma , Current Smoker,    Pulmonary exam normal breath sounds clear to auscultation       Cardiovascular negative cardio ROS Normal cardiovascular exam Rhythm:Regular Rate:Normal     Neuro/Psych PSYCHIATRIC DISORDERS Anxiety Depression Bipolar Disorder negative neurological ROS     GI/Hepatic negative GI ROS, GERD  Medicated and Controlled,(+)     substance abuse  cocaine use, Last cocaine use 6 days prior, denies any additional drug use, vital signs stable, no signs of acute intoxication   Endo/Other  negative endocrine ROS  Renal/GU negative Renal ROS  negative genitourinary   Musculoskeletal  (+) Arthritis , Rheumatoid disorders,    Abdominal   Peds negative pediatric ROS (+)  Hematology negative hematology ROS (+)   Anesthesia Other Findings   Reproductive/Obstetrics negative OB ROS                             Anesthesia Physical Anesthesia Plan  ASA: III  Anesthesia Plan: General   Post-op Pain Management: GA combined w/ Regional for post-op pain   Induction: Intravenous  Airway Management Planned: LMA  Additional Equipment:   Intra-op Plan:   Post-operative Plan: Extubation in OR  Informed Consent: I have reviewed the patients History and Physical, chart, labs and discussed the procedure including the risks, benefits and alternatives for the proposed anesthesia with the patient or authorized representative who has indicated his/her understanding and  acceptance.   Dental advisory given  Plan Discussed with: CRNA  Anesthesia Plan Comments:         Anesthesia Quick Evaluation

## 2015-10-15 NOTE — Anesthesia Procedure Notes (Addendum)
Anesthesia Regional Block:  Supraclavicular block  Pre-Anesthetic Checklist: ,, timeout performed, Correct Patient, Correct Site, Correct Laterality, Correct Procedure, Correct Position, site marked, Risks and benefits discussed,  Surgical consent,  Pre-op evaluation,  At surgeon's request and post-op pain management  Laterality: Left  Prep: Maximum Sterile Barrier Precautions used and chloraprep       Needles:  Injection technique: Single-shot  Needle Type: Echogenic Stimulator Needle     Needle Length: 10cm 10 cm Needle Gauge: 21 and 21 G    Additional Needles:  Procedures: ultrasound guided (picture in chart) and nerve stimulator Supraclavicular block Narrative:  Injection made incrementally with aspirations every 5 mL.  Performed by: Personally  Anesthesiologist: Karie Schwalbe  Additional Notes: Patient tolerated the procedure well without complications   Procedure Name: LMA Insertion Date/Time: 10/15/2015 8:00 AM Performed by: Zaydrian Batta D Pre-anesthesia Checklist: Patient identified, Emergency Drugs available, Suction available and Patient being monitored Patient Re-evaluated:Patient Re-evaluated prior to inductionOxygen Delivery Method: Circle System Utilized Preoxygenation: Pre-oxygenation with 100% oxygen Intubation Type: IV induction Ventilation: Mask ventilation without difficulty LMA: LMA inserted LMA Size: 4.0 Number of attempts: 1 Airway Equipment and Method: Bite block Placement Confirmation: positive ETCO2 Tube secured with: Tape Dental Injury: Teeth and Oropharynx as per pre-operative assessment

## 2015-10-15 NOTE — Discharge Instructions (Addendum)
Discharge Instructions ° ° °You have a dressing with a plaster splint incorporated in it. °Move your fingers as much as possible, making a full fist and fully opening the fist. °Elevate your hand to reduce pain & swelling of the digits.  Ice over the operative site may be helpful to reduce pain & swelling.  DO NOT USE HEAT. °Pain medicine has been prescribed for you.  °Use your medicine as needed over the first 48 hours, and then you can begin to taper your use.  You may use Tylenol in place of your prescribed pain medication, but not IN ADDITION to it. °Leave the dressing in place until you return to our office.  °You may shower, but keep the bandage clean & dry.  °You may drive a car when you are off of prescription pain medications and can safely control your vehicle with both hands. ° ° °Please call [phone] during normal business hours or [phone] after hours for any problems. Including the following: ° °- excessive redness of the incisions °- drainage for more than 4 days °- fever of more than 101.5 F ° °*Please note that pain medications will not be refilled after hours or on weekends. ° ° °Post Anesthesia Home Care Instructions ° °Activity: °Get plenty of rest for the remainder of the day. A responsible adult should stay with you for 24 hours following the procedure.  °For the next 24 hours, DO NOT: °-Drive a car °-Operate machinery °-Drink alcoholic beverages °-Take any medication unless instructed by your physician °-Make any legal decisions or sign important papers. ° °Meals: °Start with liquid foods such as gelatin or soup. Progress to regular foods as tolerated. Avoid greasy, spicy, heavy foods. If nausea and/or vomiting occur, drink only clear liquids until the nausea and/or vomiting subsides. Call your physician if vomiting continues. ° °Special Instructions/Symptoms: °Your throat may feel dry or sore from the anesthesia or the breathing tube placed in your throat during surgery. If this  causes discomfort, gargle with warm salt water. The discomfort should disappear within 24 hours. ° °If you had a scopolamine patch placed behind your ear for the management of post- operative nausea and/or vomiting: ° °1. The medication in the patch is effective for 72 hours, after which it should be removed.  Wrap patch in a tissue and discard in the trash. Wash hands thoroughly with soap and water. °2. You may remove the patch earlier than 72 hours if you experience unpleasant side effects which may include dry mouth, dizziness or visual disturbances. °3. Avoid touching the patch. Wash your hands with soap and water after contact with the patch. °Regional Anesthesia Blocks ° °1. Numbness or the inability to move the "blocked" extremity may last from 3-48 hours after placement. The length of time depends on the medication injected and your individual response to the medication. If the numbness is not going away after 48 hours, call your surgeon. ° °2. The extremity that is blocked will need to be protected until the numbness is gone and the  Strength has returned. Because you cannot feel it, you will need to take extra care to avoid injury. Because it may be weak, you may have difficulty moving it or using it. You may not know what position it is in without looking at it while the block is in effect. ° °3. For blocks in the legs and feet, returning to weight bearing and walking needs to be done carefully. You will need to wait until the numbness   is entirely gone and the strength has returned. You should be able to move your leg and foot normally before you try and bear weight or walk. You will need someone to be with you when you first try to ensure you do not fall and possibly risk injury. ° °4. Bruising and tenderness at the needle site are common side effects and will resolve in a few days. ° °5. Persistent numbness or new problems with movement should be communicated to the surgeon or the [facility] ([phone])/ [facility] ([phone]). °  ° ° °

## 2015-10-15 NOTE — Progress Notes (Signed)
Dr Gentry Roch discontinued order for urine drug screen

## 2015-10-15 NOTE — Progress Notes (Signed)
When getting patient up to chair she became very dizzy and appeared confused.  Place supine on stretcher.  She was still alert enough to speak but affect became somewhat dazed.  No slurring of speech but stated she was "very tired".  Stayed on stretcher and called Dr. Gentry Roch to assess.  No new orders just watch and let rest more.

## 2015-10-15 NOTE — Transfer of Care (Signed)
Immediate Anesthesia Transfer of Care Note  Patient: Sheri Hall  Procedure(s) Performed: Procedure(s): OPEN TREATMENT OF LEFT DISTAL RADIUS FRACTURE (Left)  Patient Location: PACU  Anesthesia Type:GA combined with regional for post-op pain  Level of Consciousness: awake, alert , oriented and patient cooperative  Airway & Oxygen Therapy: Patient Spontanous Breathing and Patient connected to face mask oxygen  Post-op Assessment: Report given to RN and Post -op Vital signs reviewed and stable  Post vital signs: Reviewed and stable  Last Vitals:  Filed Vitals:   10/15/15 0742 10/15/15 0743  BP:    Pulse: 63   Temp:    Resp: 11 15    Complications: No apparent anesthesia complications

## 2015-10-16 ENCOUNTER — Encounter (HOSPITAL_BASED_OUTPATIENT_CLINIC_OR_DEPARTMENT_OTHER): Payer: Self-pay | Admitting: Orthopedic Surgery

## 2015-11-13 ENCOUNTER — Ambulatory Visit: Payer: Medicaid Other | Attending: Orthopedic Surgery | Admitting: Occupational Therapy

## 2015-11-13 ENCOUNTER — Encounter: Payer: Self-pay | Admitting: Occupational Therapy

## 2015-11-13 DIAGNOSIS — R29898 Other symptoms and signs involving the musculoskeletal system: Secondary | ICD-10-CM | POA: Diagnosis present

## 2015-11-13 DIAGNOSIS — M6281 Muscle weakness (generalized): Secondary | ICD-10-CM | POA: Diagnosis present

## 2015-11-13 DIAGNOSIS — M25632 Stiffness of left wrist, not elsewhere classified: Secondary | ICD-10-CM | POA: Diagnosis present

## 2015-11-13 DIAGNOSIS — M25532 Pain in left wrist: Secondary | ICD-10-CM | POA: Diagnosis not present

## 2015-11-13 NOTE — Therapy (Signed)
[facility] [address] [place], [place], [address] Phone: [phone]   Fax:  [phone]  Occupational Therapy Evaluation  Patient Details  Name: Sheri Hall MRN: [id number] Date of Birth: 01-Jun-1971 Referring Provider: Dr. Mack Hook  Encounter Date: 11/13/2015      OT End of Session - 11/13/15 0944    Visit Number 1   Number of Visits 8   Date for OT Re-Evaluation 01/08/16   Authorization Type MCD - awaiting authorization   Authorization - Visit Number 1   Authorization - Number of Visits 4   OT Start Time 0845   OT Stop Time 0930   OT Time Calculation (min) 45 min   Activity Tolerance Patient tolerated treatment well      Past Medical History  Diagnosis Date  . Arthritis     rheumatoid   . Asthma     seasonal  . Depression   . Anxiety   . Bipolar disorder (HCC)   . GERD (gastroesophageal reflux disease)   . PTSD (post-traumatic stress disorder)   . OCD (obsessive compulsive disorder)     Past Surgical History  Procedure Laterality Date  . Cesarean section    . Open reduction internal fixation (orif) distal radial fracture Left 10/15/2015    Procedure: OPEN TREATMENT OF LEFT DISTAL RADIUS FRACTURE;  Surgeon: Mack Hook, MD;  Location: [facility];  Service: Orthopedics;  Laterality: Left;    There were no vitals filed for this visit.      Subjective Assessment - 11/13/15 0844    Subjective  I'm very overprotective and scared right now   Pertinent History RA Bilateral hands   Limitations no strengthening at this time, pt very stiff   Patient Stated Goals move my hand like I did before without being scared   Currently in Pain? Yes   Pain Score 5    Pain Location Wrist   Pain Orientation Left   Pain Descriptors / Indicators Aching;Dull   Pain Onset 1 to 4 weeks ago   Pain Frequency Intermittent   Aggravating Factors  increased activity, certain movements   Pain  Relieving Factors Meds, elevation           [facility] OT Assessment - 11/13/15 0001    Assessment   Diagnosis Lt distal radius fracture s/p ORIF    Referring Provider Dr. Mack Hook   Onset Date 10/15/15  surgery date (original injury date 09/29/15)   Assessment Pt arrived in pre-fabricated splint provided at MD office   Prior Therapy none   Precautions   Precautions Other (comment)   Precaution Comments Removable pre-fabricated wrist splint, no strengthening at this time   Balance Screen   Has the patient fallen in the past 6 months No   Has the patient had a decrease in activity level because of a fear of falling?  No   Is the patient reluctant to leave their home because of a fear of falling?  No   Home  Environment   Lives With --  children (80 y.o. 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 Independent feeding self and other grooming activities. Mod I with dressing but occasionally requires assist from 74 y.o. son to hook pants buttons and tie shoes.  Pt with increased difficulty hooking bra. Mod I with bathing. Pt preparing breakfast, cold and microwaveable items, but eating out a lot for dinner due to injury. Son assist with cleaning. Adult daughter and son assist with grocery shopping   Mobility   Mobility Status Independent   Written Expression   Dominant Hand Right   Handwriting 100% legible   Observation/Other Assessments   Observations Long incision volar wrist with stitches still in place, only minimal swelling, and no signs of infection. Pt with Rheumatoid arthritis bilateral hands/feet, Rt hand worse than Lt especially all MP joints.    Sensation   Additional Comments denies change   Coordination   9 Hole Peg Test Right;Left   Right 9 Hole Peg Test 19.15 sec    Left 9 Hole Peg Test 29.60 sec   Edema   Edema mild Rt hand  premorbid bilateral joint swelling from RA   ROM / Strength   AROM / PROM / Strength AROM   AROM   Overall AROM Comments BUE AROM at shoulders, elbows WNL's. Rt elbow distally WFL's.  Lt supination = 60 degrees, pronation WFL's. Wrist flex/ext = 5 to 10 degrees past neutral. Composite finger flex/ext 90%. Thumb opposition to 4th digit.    Hand Function   Right Hand Grip (lbs) 20 lbs (due to RA)  Did not assess Lt hand d/t current precautions                         OT Education - 11/13/15 0945    Education provided Yes   Education Details A/ROM and P/ROM HEP for wrist/forearm   Person(s) Educated Patient   Methods Explanation;Demonstration;Handout   Comprehension Verbalized understanding;Returned demonstration             OT Long Term Goals - 11/13/15 0954    OT LONG TERM GOAL #1   Title Independent with udpated HEP (Due by 01/08/16)   Baseline dependent d/t current precautions, initial HEP issued   Time 8   Period Weeks   Status New   OT LONG TERM GOAL #2   Title Pt to demo 30 degrees or greater wrist flexion and extension LUE for functional tasks   Baseline 5-10 degrees each way   Time 8   Period Weeks   Status New   OT LONG TERM GOAL #3   Title Pt to demo 20 lbs grip strength Lt hand in prep for opening containers   Baseline unable to assess d/t current precautions   Time 8   Period Weeks   Status New   OT LONG TERM GOAL #4   Title Pain less than or equal to 5/10 for daily activities Lt wrist   Baseline up to 10/10   Time 8   Period Weeks   Status New   OT LONG TERM GOAL #5   Title Pt to return to using Lt hand as assist for all bilateral tasks   Baseline dependent at this time   Time 8   Period Weeks   Status New               Plan - 11/13/15 0945    Clinical Impression Statement Pt is a 45 y.o. female who presents to outpatient rehab for  moderate complexitiy O.T.  evaluation s/p ORIF due to Lt distal radius fracture (25956). Surgery date 10/15/15. Pt with significant RA also limiting bilateral hand function. Pt's below deficits are currently limitiing ability to perform  ADLS, IADLS, childcare needs, and work related tasks. Pt also has very little support (19 y.o. son is helping with ADLS)   Rehab Potential Fair   OT Frequency 1x / week  3 VISITS OVER 8 WEEK DURATION REQUESTED FROM MEDICAID    OT Duration --  Recommending 1x/wk for 8 weeks   OT Treatment/Interventions Self-care/ADL training;Therapeutic exercise;Patient/family education;Ultrasound;Manual Therapy;Splinting;Cryotherapy;Parrafin;DME and/or AE instruction;Compression bandaging;Therapeutic activities;Electrical Stimulation;Fluidtherapy;Scar mobilization;Moist Heat;Passive range of motion   Plan review HEP, ? Estim, scar massage   Consulted and Agree with Plan of Care Patient      Patient will benefit from skilled therapeutic intervention in order to improve the following deficits and impairments:  Decreased coordination, Decreased range of motion, Impaired flexibility, Increased edema, Impaired sensation, Decreased knowledge of precautions, Decreased skin integrity, Decreased scar mobility, Impaired UE functional use, Pain, Decreased mobility, Decreased strength  Visit Diagnosis: Pain in left wrist - Plan: Ot plan of care cert/re-cert  Stiffness of left wrist, not elsewhere classified - Plan: Ot plan of care cert/re-cert  Other symptoms and signs involving the musculoskeletal system - Plan: Ot plan of care cert/re-cert  Muscle weakness (generalized) - Plan: Ot plan of care cert/re-cert    Problem List There are no active problems to display for this patient.   Kelli Churn, OTR/L 11/13/2015, 10:06 AM  [facility] [address] [place], [place], [address] Phone: [phone]   Fax:  [phone]  Name: Sheri Hall MRN: [id number] Date of Birth: 1970/10/10

## 2015-11-13 NOTE — Patient Instructions (Signed)
AROM: Wrist Extension   .  With __Lt__ palm down, bend wrist up. Repeat __15__ times per set.  Do __4-6__ sessions per day.    AROM: Wrist Flexion   With__Lt___ palm up, bend wrist up. Repeat __15__ times per set.  Do _4-6___ sessions per day.     Elbow / Wrist Supination / Pronation   Bend Lt elbow(s) to 90 and hold close to body. Turn palm up. Then turn palm down. Keep wrist straight. Repeat sequence _10-15___ times per session. Do _4-6___ sessions per day.   PROM: Wrist Flexion / Extension   Grasp  hand and slowly bend wrist until stretch is felt. Relax. Then stretch as far as possible in opposite direction. Be sure to keep elbow bent.  Hold __10__ sec. each way Repeat _5___ times per set.    Do _4-6___ sessions per day.    Supination (Passive)   Keep elbow bent at right angle and held firmly at side. Use other hand to turn forearm until palm faces upward. Hold __10__ seconds. Repeat __5__ times. Do _4-6___ sessions per day.   AROM: Finger Flexion / Extension    Actively bend fingers o left hand. Start with knuckles furthest from palm, and slowly make a fist. Hold _5_ seconds. Relax. Then straighten fingers as far as possible. Repeat _10___ times per set. Do _6___ sessions per day.   Opposition (Active)    Touch tip of thumb to nail tip of each finger in turn, making an "O" shape. Repeat _10___ times. Do _6___ sessions per day.  Copyright  VHI. All rights reserved.

## 2015-11-27 ENCOUNTER — Ambulatory Visit: Payer: Medicaid Other | Attending: Orthopedic Surgery | Admitting: Occupational Therapy

## 2015-12-04 ENCOUNTER — Ambulatory Visit: Payer: Medicaid Other | Admitting: Occupational Therapy

## 2015-12-11 ENCOUNTER — Ambulatory Visit: Payer: Medicaid Other | Admitting: Occupational Therapy

## 2016-02-18 ENCOUNTER — Other Ambulatory Visit: Payer: Self-pay | Admitting: Family Medicine

## 2016-02-18 DIAGNOSIS — Z1231 Encounter for screening mammogram for malignant neoplasm of breast: Secondary | ICD-10-CM

## 2016-02-25 ENCOUNTER — Ambulatory Visit: Payer: Medicaid Other

## 2016-03-04 ENCOUNTER — Ambulatory Visit: Payer: Medicaid Other

## 2016-06-10 ENCOUNTER — Encounter: Payer: Self-pay | Admitting: Occupational Therapy

## 2016-06-10 NOTE — Therapy (Signed)
[facility] [address] [place], [place], [address] Phone: [phone]   Fax:  [phone]  Patient Details  Name: Sheri Hall MRN: [id number] Date of Birth: Dec 04, 1970 Referring Provider:  Dr. Mack Hook  Encounter Date: 06/10/2016  Pt did not return after initial evaluation on 11/13/15 for follow up therapy therefore will d/c episode of care at this time.    Kelli Churn, OTR/L 06/10/2016, 11:24 AM  [facility] [address] [place], [place], [address] Phone: [phone]   Fax:  [phone]

## 2016-09-25 ENCOUNTER — Other Ambulatory Visit: Payer: Self-pay | Admitting: Obstetrics & Gynecology

## 2016-09-25 DIAGNOSIS — R928 Other abnormal and inconclusive findings on diagnostic imaging of breast: Secondary | ICD-10-CM

## 2016-10-02 ENCOUNTER — Other Ambulatory Visit: Payer: Medicaid Other

## 2016-10-07 ENCOUNTER — Other Ambulatory Visit: Payer: Medicaid Other

## 2016-10-14 DIAGNOSIS — M05742 Rheumatoid arthritis with rheumatoid factor of left hand without organ or systems involvement: Secondary | ICD-10-CM | POA: Insufficient documentation

## 2016-10-14 DIAGNOSIS — M79641 Pain in right hand: Secondary | ICD-10-CM | POA: Insufficient documentation

## 2016-10-23 ENCOUNTER — Other Ambulatory Visit: Payer: Medicaid Other

## 2016-11-10 ENCOUNTER — Other Ambulatory Visit: Payer: Medicaid Other

## 2016-12-08 ENCOUNTER — Ambulatory Visit
Admission: RE | Admit: 2016-12-08 | Discharge: 2016-12-08 | Disposition: A | Discharge status: Home / Self Care | Payer: Medicaid Other | Source: Ambulatory Visit | Attending: Obstetrics & Gynecology | Admitting: Obstetrics & Gynecology

## 2016-12-08 ENCOUNTER — Other Ambulatory Visit: Payer: Self-pay | Admitting: Obstetrics & Gynecology

## 2016-12-08 DIAGNOSIS — R928 Other abnormal and inconclusive findings on diagnostic imaging of breast: Secondary | ICD-10-CM

## 2017-01-11 ENCOUNTER — Encounter (HOSPITAL_COMMUNITY): Payer: Self-pay | Admitting: Emergency Medicine

## 2017-01-11 ENCOUNTER — Emergency Department (HOSPITAL_COMMUNITY)
Admission: EM | Admit: 2017-01-11 | Discharge: 2017-01-11 | Disposition: A | Discharge status: Home / Self Care | Payer: Medicaid Other | Attending: Emergency Medicine | Admitting: Emergency Medicine

## 2017-01-11 ENCOUNTER — Other Ambulatory Visit: Payer: Self-pay

## 2017-01-11 ENCOUNTER — Emergency Department (HOSPITAL_COMMUNITY): Payer: Medicaid Other

## 2017-01-11 DIAGNOSIS — Z79899 Other long term (current) drug therapy: Secondary | ICD-10-CM | POA: Diagnosis not present

## 2017-01-11 DIAGNOSIS — R079 Chest pain, unspecified: Secondary | ICD-10-CM | POA: Diagnosis present

## 2017-01-11 DIAGNOSIS — F17210 Nicotine dependence, cigarettes, uncomplicated: Secondary | ICD-10-CM | POA: Diagnosis not present

## 2017-01-11 DIAGNOSIS — J189 Pneumonia, unspecified organism: Secondary | ICD-10-CM | POA: Insufficient documentation

## 2017-01-11 DIAGNOSIS — X500XXA Overexertion from strenuous movement or load, initial encounter: Secondary | ICD-10-CM | POA: Diagnosis not present

## 2017-01-11 DIAGNOSIS — M546 Pain in thoracic spine: Secondary | ICD-10-CM | POA: Diagnosis not present

## 2017-01-11 DIAGNOSIS — J45909 Unspecified asthma, uncomplicated: Secondary | ICD-10-CM | POA: Insufficient documentation

## 2017-01-11 DIAGNOSIS — Y990 Civilian activity done for income or pay: Secondary | ICD-10-CM | POA: Insufficient documentation

## 2017-01-11 DIAGNOSIS — Y9389 Activity, other specified: Secondary | ICD-10-CM | POA: Diagnosis not present

## 2017-01-11 DIAGNOSIS — R091 Pleurisy: Secondary | ICD-10-CM

## 2017-01-11 DIAGNOSIS — Y929 Unspecified place or not applicable: Secondary | ICD-10-CM | POA: Insufficient documentation

## 2017-01-11 LAB — I-STAT BETA HCG BLOOD, ED (NOT ORDERABLE): I-stat hCG, quantitative: <5 m[IU]/mL (ref <5)

## 2017-01-11 LAB — POCT I-STAT, CHEM 8
BUN: 13 mg/dL (ref 6–20)
CHLORIDE: 103 mmol/L (ref 101–111)
CREATININE: 0.6 mg/dL (ref 0.44–1.00)
Calcium, Ion: 1.19 mmol/L (ref 1.15–1.40)
GLUCOSE: 77 mg/dL (ref 65–99)
HCT: 39 % (ref 36.0–46.0)
Hemoglobin: 13.3 g/dL (ref 12.0–15.0)
POTASSIUM: 3.6 mmol/L (ref 3.5–5.1)
Sodium: 141 mmol/L (ref 135–145)
TCO2: 27 mmol/L (ref 0–100)

## 2017-01-11 LAB — POCT I-STAT TROPONIN I: Troponin i, poc: 0 ng/mL (ref 0.00–0.08)

## 2017-01-11 LAB — D-DIMER, QUANTITATIVE: D-Dimer, Quant: 1.02 ug/mL-FEU — ABNORMAL HIGH (ref 0.00–0.50)

## 2017-01-11 MED ORDER — LEVOFLOXACIN 750 MG PO TABS: 750.0000 mg | ORAL_TABLET | Freq: Every day | ORAL | 0 refills | Status: DC

## 2017-01-11 MED ORDER — SODIUM CHLORIDE 0.9 % IV BOLUS (SEPSIS)
1000.0000 mL | Freq: Once | INTRAVENOUS | Status: AC
  Administered 2017-01-11: 1000 mL via INTRAVENOUS

## 2017-01-11 MED ORDER — CYCLOBENZAPRINE HCL 10 MG PO TABS
10.0000 mg | ORAL_TABLET | Freq: Once | ORAL | Status: AC
  Administered 2017-01-11: 10 mg via ORAL
  Filled 2017-01-11: qty 1

## 2017-01-11 MED ORDER — MORPHINE SULFATE (PF) 4 MG/ML IV SOLN
4.0000 mg | Freq: Once | INTRAVENOUS | Status: AC
  Administered 2017-01-11: 4 mg via INTRAVENOUS
  Filled 2017-01-11: qty 1

## 2017-01-11 MED ORDER — HYDROCODONE-HOMATROPINE 5-1.5 MG/5ML PO SYRP: 5.0000 mL | ORAL_SOLUTION | Freq: Four times a day (QID) | ORAL | 0 refills | Status: DC | PRN

## 2017-01-11 MED ORDER — BENZONATATE 100 MG PO CAPS: 100.0000 mg | ORAL_CAPSULE | Freq: Three times a day (TID) | ORAL | 0 refills | Status: DC | PRN

## 2017-01-11 MED ORDER — NAPROXEN 375 MG PO TABS: 375.0000 mg | ORAL_TABLET | Freq: Two times a day (BID) | ORAL | 0 refills | Status: DC | PRN

## 2017-01-11 MED ORDER — ONDANSETRON HCL 4 MG/2ML IJ SOLN
4.0000 mg | Freq: Once | INTRAMUSCULAR | Status: AC
  Administered 2017-01-11: 4 mg via INTRAVENOUS
  Filled 2017-01-11: qty 2

## 2017-01-11 MED ORDER — LEVOFLOXACIN 750 MG PO TABS: 750.0000 mg | ORAL_TABLET | Freq: Every day | ORAL | 0 refills | Status: AC

## 2017-01-11 MED ORDER — LEVOFLOXACIN 750 MG PO TABS
750.0000 mg | ORAL_TABLET | Freq: Once | ORAL | Status: AC
  Administered 2017-01-11: 750 mg via ORAL
  Filled 2017-01-11: qty 1

## 2017-01-11 MED ORDER — IOPAMIDOL (ISOVUE-370) INJECTION 76%
INTRAVENOUS | Status: AC
  Filled 2017-01-11: qty 100

## 2017-01-11 MED ORDER — OXYCODONE-ACETAMINOPHEN 5-325 MG PO TABS
2.0000 | ORAL_TABLET | Freq: Once | ORAL | Status: AC
  Administered 2017-01-11: 2 via ORAL
  Filled 2017-01-11: qty 2

## 2017-01-11 MED ORDER — HYDROCODONE-ACETAMINOPHEN 5-325 MG PO TABS: 1.0000 | ORAL_TABLET | ORAL | 0 refills | Status: DC | PRN

## 2017-01-11 MED ORDER — IOPAMIDOL (ISOVUE-370) INJECTION 76%
100.0000 mL | Freq: Once | INTRAVENOUS | Status: AC | PRN
  Administered 2017-01-11: 100 mL via INTRAVENOUS

## 2017-01-11 NOTE — ED Triage Notes (Addendum)
Pt reports she began to have L upper back pain since yesterday. Pain worse with movement and inhalation. No CP. Some SOB. No known injury.

## 2017-01-11 NOTE — ED Provider Notes (Signed)
[facility]-EMERGENCY DEPT Provider Note   CSN: [id number] Arrival date & time: 01/11/17  1554     History   Chief Complaint Chief Complaint  Patient presents with  . Back Pain  . Shortness of Breath    HPI Sheri Hall is a 46 y.o. female.  HPI   46 yo F with PMHx as below here with left-sided CP. Pt states that her sx began approx 1 week ago. 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 After starting work, she noticed an aching, throbbing, cramp-like pain in her left back that is worse with any movement or palpation. The pain is made worse with deep inspiration, movement, and palpation. She has not taken anything for this. Denies any fevers. She has a chronic cough that is not significant worsened, with no sputum production. No history of cardiac disease.  Past Medical History:  Diagnosis Date  . Anxiety   . Arthritis    rheumatoid   . Asthma    seasonal  . Bipolar disorder (HCC)   . Depression   . GERD (gastroesophageal reflux disease)   . OCD (obsessive compulsive disorder)   . PTSD (post-traumatic stress disorder)     There are no active problems to display for this patient.   Past Surgical History:  Procedure Laterality Date  . CESAREAN SECTION    . OPEN REDUCTION INTERNAL FIXATION (ORIF) DISTAL RADIAL FRACTURE Left 10/15/2015   Procedure: OPEN TREATMENT OF LEFT DISTAL RADIUS FRACTURE;  Surgeon: Mack Hook, MD;  Location: [facility];  Service: Orthopedics;  Laterality: Left;    OB History    No data available       Home Medications    Prior to Admission medications   Medication Sig Start Date End Date Taking? Authorizing Provider  cycloSPORINE (RESTASIS) 0.05 % ophthalmic emulsion Place 1 drop into both eyes daily.   Yes [provider]  folic acid (FOLVITE) 1 MG tablet Take 1 mg by mouth daily. 10/14/16  Yes [provider]  hydrOXYzine (VISTARIL) 25 MG  capsule Take 25 mg by mouth 3 (three) times daily. 09/16/16  Yes [provider]  methotrexate (RHEUMATREX) 2.5 MG tablet Take 12.5 tablets by mouth once a week. 11/18/16  Yes [provider]  naproxen sodium (ANAPROX) 220 MG tablet Take 220 mg by mouth 2 (two) times daily with a meal.   Yes [provider]  prednisoLONE acetate (PRED FORTE) 1 % ophthalmic suspension Place 1 drop into both eyes daily. 10/02/16  Yes [provider]  sertraline (ZOLOFT) 25 MG tablet Take 25 mg by mouth daily. 08/19/16  Yes [provider]  benzonatate (TESSALON) 100 MG capsule Take 1 capsule (100 mg total) by mouth 3 (three) times daily as needed for cough. 01/11/17   Shaune Pollack, MD  docusate sodium (COLACE) 100 MG capsule Take 1 capsule (100 mg total) by mouth daily as needed for mild constipation. Patient not taking: Reported on 11/13/2015 10/15/15   Mack Hook, MD  HYDROcodone-acetaminophen (NORCO/VICODIN) 5-325 MG tablet Take 1-2 tablets by mouth every 4 (four) hours as needed for severe pain. 01/11/17   Shaune Pollack, MD  levofloxacin (LEVAQUIN) 750 MG tablet Take 1 tablet (750 mg total) by mouth daily. 01/11/17 01/16/17  Shaune Pollack, MD  oxyCODONE-acetaminophen (PERCOCET/ROXICET) 5-325 MG tablet Take 1-2 tablets by mouth every 6 (six) hours as needed for severe pain (max 8 per day). Patient not taking: Reported on  11/13/2015 10/15/15   Mack Hook, MD  predniSONE (DELTASONE) 5 MG tablet Take 10 mg by mouth daily. 10/14/16   [provider]    Family History History reviewed. No pertinent family history.  Social History Social History  Substance Use Topics  . Smoking status: Current Every Day Smoker    Packs/day: 0.50    Types: Cigarettes  . Smokeless tobacco: Not on file  . Alcohol use Yes     Comment: occ      Allergies   Patient has no known allergies.   Review of Systems Review of Systems  Respiratory: Positive for shortness of  breath.   Musculoskeletal: Positive for back pain.  All other systems reviewed and are negative.    Physical Exam Updated Vital Signs BP 106/81 (BP Location: Right Arm)   Pulse 60   Temp 98 F (36.7 C) (Oral)   Resp 19   LMP  (LMP Unknown)   SpO2 97%   Physical Exam  Constitutional: She is oriented to person, place, and time. She appears well-developed and well-nourished. No distress.  HENT:  Head: Normocephalic and atraumatic.  Eyes: Conjunctivae are normal.  Neck: Neck supple.  Cardiovascular: Normal rate, regular rhythm and normal heart sounds.  Exam reveals no friction rub.   No murmur heard. Pulmonary/Chest: Effort normal and breath sounds normal. No tachypnea. No respiratory distress. She has no decreased breath sounds. She has no wheezes. She has no rales.  Abdominal: She exhibits no distension.  Musculoskeletal: She exhibits no edema.       Back:  Exquisite TTP over right mid thoracic paraspinal musculature/medial trapezius, with reproduction of pt's pain.  Neurological: She is alert and oriented to person, place, and time. She exhibits normal muscle tone.  Skin: Skin is warm. Capillary refill takes less than 2 seconds.  Psychiatric: She has a normal mood and affect.  Nursing note and vitals reviewed.    ED Treatments / Results  Labs (all labs ordered are listed, but only abnormal results are displayed) Labs Reviewed  D-DIMER, QUANTITATIVE (NOT AT [facility]) - Abnormal; Notable for the following:       Result Value   D-Dimer, Quant 1.02 (*)    All other components within normal limits  I-STAT TROPOININ, ED  I-STAT BETA HCG BLOOD, ED ([facility], [facility], [facility] ONLY)  I-STAT CHEM 8, ED  POCT I-STAT, CHEM 8  POCT I-STAT TROPONIN I  I-STAT BETA HCG BLOOD, ED (NOT ORDERABLE)    EKG  EKG Interpretation  Date/Time:  Sunday January 11 2017 16:18:59 EDT Ventricular Rate:  84 PR Interval:    QRS Duration: 94 QT Interval:  356 QTC Calculation: 421 R Axis:   27 Text  Interpretation:  Sinus rhythm Probable left atrial enlargement Nonspecific T abnormalities, diffuse leads Sicne last EKG, there are non-specific, diffuse TW changes No st elevations or depressions Confirmed by Shaune Pollack [phone]) on 01/12/2017 12:13:30 AM       Radiology Dg Chest 2 View  Result Date: 01/11/2017 CLINICAL DATA:  Pt reports she began to have L upper back pain since yesterday. Pain worse with movement and inhalation. No CP. Some SOB. H/o asthma. Smoker EXAM: CHEST  2 VIEW COMPARISON:  10/14/2016 FINDINGS: Heart, mediastinum and hila are within normal limits. Minor stable scarring in the left upper lobe lingula. Lungs otherwise clear. No pleural effusion or pneumothorax. Skeletal structures are unremarkable. IMPRESSION: No active cardiopulmonary disease. Electronically Signed   By: Amie Portland M.D.   On: 01/11/2017 16:55  Ct Angio Chest Pe W And/or Wo Contrast  Result Date: 01/11/2017 CLINICAL DATA:  Pt reports she began to have L upper back pain since yesterday. Pain worse with movement and inhalation. No CP. Some SOB. No known injury EXAM: CT ANGIOGRAPHY CHEST WITH CONTRAST TECHNIQUE: Multidetector CT imaging of the chest was performed using the standard protocol during bolus administration of intravenous contrast. Multiplanar CT image reconstructions and MIPs were obtained to evaluate the vascular anatomy. CONTRAST:  100 mL of Isovue 370 intravenous contrast COMPARISON:  Current chest radiograph. FINDINGS: Cardiovascular: Satisfactory opacification of the pulmonary arteries to the segmental level. No evidence of pulmonary embolism. Normal heart size. No pericardial effusion. No coronary artery calcifications. Aorta is normal in caliber. No aortic atherosclerosis. No dissection. Mediastinum/Nodes: There is some increased attenuation along the anterior mediastinum is likely residual thymus. This has a more focal appearance in the upper mediastinum were measures 15 mm. Mediastinum  otherwise unremarkable with no other evidence of mass or adenopathy. No neck base or axillary masses or adenopathy. Trachea and esophagus are unremarkable. Lungs/Pleura: Focal opacity noted in the left upper lobe measuring 2.6 x 1.0 x 2.1 cm, centered on image 22, series 11. Margins are ill-defined. Linear type opacity noted in the left upper lobe anteriorly extending to near the apex. This is likely atelectasis or scarring. Small nodular area noted in the right upper lobe centered on image 33, series 11, measuring 9 mm. There multiple additional nodules. 4 mm nodule in the right lower lobe, image 49. 4 mm nodule in the left lower lobe, image 55. Ill-defined area of nodular opacity in the left lower lobe, image 61. Several small additional nodules are noted bilaterally. There is bronchial wall thickening to the lower lobes, left upper lobe lingula and right middle lobe. Reticular and small patchy areas of opacity are noted the lower lobes. There is additional opacity at the base of right middle lobe and left upper lobe lingula. This is likely atelectasis. There is subtle mosaic attenuation consistent with airways disease. Trace pleural fluid is noted on the left. Upper Abdomen: Unremarkable. Musculoskeletal: No chest wall abnormality. No acute or significant osseous findings. Review of the MIP images confirms the above findings. IMPRESSION: 1. No evidence of a pulmonary embolism. 2. Focal opacity in the left upper lobe measuring 2.6 cm in greatest dimension. This is likely pneumonia. No other evidence of pneumonia. There is bronchial wall thickening, mostly in the lower lobes, left upper lobe lingula and right middle lobe. There is reticular and patchy associated opacity that is most likely atelectasis. Mild mosaic type attenuation of the lungs is presumed to be due to small airways disease and air trapping. 3. No evidence of pulmonary edema. 4. Multiple small pulmonary nodules. These may be inflammatory. Given the  focal nature of the presumed pneumonia in the left upper lobe, short-term follow-up CT is recommended to document resolution/ improvement, with repeat unenhanced chest CT in 3 months. No emphysema or aortic atherosclerosis. Electronically Signed   By: Amie Portland M.D.   On: 01/11/2017 21:50    Procedures Procedures (including critical care time)  Medications Ordered in ED Medications  iopamidol (ISOVUE-370) 76 % injection (not administered)  oxyCODONE-acetaminophen (PERCOCET/ROXICET) 5-325 MG per tablet 2 tablet (2 tablets Oral Given 01/11/17 1825)  cyclobenzaprine (FLEXERIL) tablet 10 mg (10 mg Oral Given 01/11/17 1825)  morphine 4 MG/ML injection 4 mg (4 mg Intravenous Given 01/11/17 2020)  ondansetron (ZOFRAN) injection 4 mg (4 mg Intravenous Given 01/11/17 2020)  sodium  chloride 0.9 % bolus 1,000 mL (0 mLs Intravenous Stopped 01/11/17 2212)  iopamidol (ISOVUE-370) 76 % injection 100 mL (100 mLs Intravenous Contrast Given 01/11/17 2102)  levofloxacin (LEVAQUIN) tablet 750 mg (750 mg Oral Given 01/11/17 2248)     Initial Impression / Assessment and Plan / ED Course  I have reviewed the triage vital signs and the nursing notes.  Pertinent labs & imaging results that were available during my care of the patient were reviewed by me and considered in my medical decision making (see chart for details).     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 On arrival, VSS and WNL. She is not hypoxic or tachypneic. CXR is clear. Her exam is highly c/w likely MSK spasm/strain and is reproducible on exam with appropriate history. I suspect her degree of pain/spasm is also exacerbated by her chronic pain and RA. However, initial labs obtained given her comorbidities and do show an elevated D-Dimer. CT subsequently obtained. CT shows possible mild, small, atypical PNA. While I suspect this is 2/2 her underlying RA and lung involvement,  given her findings and history will elect to treat with Levaquin. EKG with non specific changes and trop neg - doubt ACS. Pt has no fever, tachycardia, tachypnea, hypoxia, ro signs to suggest clinically significant PNA. She is o/w well appearing and pain is resolved s/p analgesics. Will place on supportive care for her mSK pain, but also tx for early PNA and give strict return precautions given her high risk med use. Pt in agreement. She is tolerating PO and well appearing.  Final Clinical Impressions(s) / ED Diagnoses   Final diagnoses:  Community acquired pneumonia of left lung, unspecified part of lung  Pleurisy    New Prescriptions Discharge Medication List as of 01/11/2017 10:58 PM    START taking these medications   Details  benzonatate (TESSALON) 100 MG capsule Take 1 capsule (100 mg total) by mouth 3 (three) times daily as needed for cough., Starting Sun 01/11/2017, Print    HYDROcodone-acetaminophen (NORCO/VICODIN) 5-325 MG tablet Take 1-2 tablets by mouth every 4 (four) hours as needed for severe pain., Starting Sun 01/11/2017, Print         Shaune Pollack, MD 01/12/17 [phone]

## 2017-01-11 NOTE — Discharge Instructions (Signed)
TAKE THE ANTIBIOTICS AS PRESCRIBED  IT IS VERY IMPORTANT THAT YOU FOLLOW-UP WITH A DOCTOR TO SET UP A REPEAT X RAY AND CAT SCAN OF YOUR CHEST, TO MAKE SURE THAT YOUR PNEUMONIA IMPROVES AND TO FOLLOW-UP SEVERAL LUNG NODULES (WHICH ARE LIKELY FROM THE PNEUMONIA) TO MAKE SURE THEY GO AWAY

## 2017-01-11 NOTE — ED Notes (Signed)
Pt refused blood draw from RN.  MD aware.

## 2017-09-15 ENCOUNTER — Encounter (HOSPITAL_COMMUNITY): Payer: Self-pay | Admitting: Emergency Medicine

## 2017-09-15 ENCOUNTER — Emergency Department (HOSPITAL_COMMUNITY)
Admission: EM | Admit: 2017-09-15 | Discharge: 2017-09-16 | Disposition: A | Discharge status: Home / Self Care | Payer: Medicaid Other | Attending: Emergency Medicine | Admitting: Emergency Medicine

## 2017-09-15 DIAGNOSIS — F419 Anxiety disorder, unspecified: Secondary | ICD-10-CM | POA: Diagnosis not present

## 2017-09-15 DIAGNOSIS — Z79899 Other long term (current) drug therapy: Secondary | ICD-10-CM | POA: Insufficient documentation

## 2017-09-15 DIAGNOSIS — R45851 Suicidal ideations: Secondary | ICD-10-CM | POA: Diagnosis not present

## 2017-09-15 DIAGNOSIS — F319 Bipolar disorder, unspecified: Secondary | ICD-10-CM | POA: Diagnosis not present

## 2017-09-15 DIAGNOSIS — F329 Major depressive disorder, single episode, unspecified: Secondary | ICD-10-CM | POA: Diagnosis not present

## 2017-09-15 DIAGNOSIS — J45909 Unspecified asthma, uncomplicated: Secondary | ICD-10-CM | POA: Insufficient documentation

## 2017-09-15 DIAGNOSIS — H1031 Unspecified acute conjunctivitis, right eye: Secondary | ICD-10-CM | POA: Diagnosis not present

## 2017-09-15 DIAGNOSIS — F17210 Nicotine dependence, cigarettes, uncomplicated: Secondary | ICD-10-CM | POA: Insufficient documentation

## 2017-09-15 DIAGNOSIS — F4310 Post-traumatic stress disorder, unspecified: Secondary | ICD-10-CM | POA: Diagnosis not present

## 2017-09-15 LAB — COMPREHENSIVE METABOLIC PANEL
ALBUMIN: 3 g/dL — AB (ref 3.5–5.0)
ALT: 22 U/L (ref 14–54)
AST: 28 U/L (ref 15–41)
Alkaline Phosphatase: 59 U/L (ref 38–126)
Anion gap: 10 (ref 5–15)
BILIRUBIN TOTAL: 0.6 mg/dL (ref 0.3–1.2)
BUN: 10 mg/dL (ref 6–20)
CHLORIDE: 104 mmol/L (ref 101–111)
CO2: 24 mmol/L (ref 22–32)
CREATININE: 0.86 mg/dL (ref 0.44–1.00)
Calcium: 8.4 mg/dL — ABNORMAL LOW (ref 8.9–10.3)
GFR calc Af Amer: >60 mL/min (ref >60)
GLUCOSE: 103 mg/dL — AB (ref 65–99)
Potassium: 3.9 mmol/L (ref 3.5–5.1)
Sodium: 138 mmol/L (ref 135–145)
Total Protein: 6.8 g/dL (ref 6.5–8.1)

## 2017-09-15 LAB — RAPID URINE DRUG SCREEN, HOSP PERFORMED
Amphetamines: NOT DETECTED
BARBITURATES: NOT DETECTED
Benzodiazepines: NOT DETECTED
Cocaine: POSITIVE — AB
Opiates: NOT DETECTED
TETRAHYDROCANNABINOL: NOT DETECTED

## 2017-09-15 LAB — ETHANOL

## 2017-09-15 LAB — CBC
HEMATOCRIT: 41.1 % (ref 36.0–46.0)
HEMOGLOBIN: 12.8 g/dL (ref 12.0–15.0)
MCH: 27.4 pg (ref 26.0–34.0)
MCHC: 31.1 g/dL (ref 30.0–36.0)
MCV: 87.8 fL (ref 78.0–100.0)
Platelets: 327 10*3/uL (ref 150–400)
RBC: 4.68 MIL/uL (ref 3.87–5.11)
RDW: 14.6 % (ref 11.5–15.5)
WBC: 6.6 10*3/uL (ref 4.0–10.5)

## 2017-09-15 LAB — I-STAT BETA HCG BLOOD, ED (MC, WL, AP ONLY)

## 2017-09-15 MED ORDER — ERYTHROMYCIN 5 MG/GM OP OINT
1.0000 "application " | TOPICAL_OINTMENT | Freq: Four times a day (QID) | OPHTHALMIC | Status: DC
  Administered 2017-09-15 – 2017-09-16 (×2): 1 via OPHTHALMIC
  Filled 2017-09-15 (×2): qty 3.5

## 2017-09-15 MED ORDER — TETRACAINE HCL 0.5 % OP SOLN
1.0000 [drp] | Freq: Once | OPHTHALMIC | Status: AC
  Administered 2017-09-15: 1 [drp] via OPHTHALMIC
  Filled 2017-09-15: qty 4

## 2017-09-15 MED ORDER — ACETAMINOPHEN 325 MG PO TABS
650.0000 mg | ORAL_TABLET | Freq: Once | ORAL | Status: AC
  Administered 2017-09-15: 650 mg via ORAL
  Filled 2017-09-15: qty 2

## 2017-09-15 MED ORDER — FLUORESCEIN SODIUM 1 MG OP STRP
1.0000 | ORAL_STRIP | Freq: Once | OPHTHALMIC | Status: AC
  Administered 2017-09-15: 1 via OPHTHALMIC
  Filled 2017-09-15: qty 1

## 2017-09-15 NOTE — ED Notes (Addendum)
Pt placed in A10 for administration of ordered tetracaine and fluoroscein. This Charity fundraiser to gather supplies for [place], [place]

## 2017-09-15 NOTE — ED Notes (Signed)
Pt emotional (crying). Pt given a box of tissue.

## 2017-09-15 NOTE — ED Provider Notes (Signed)
[facility] EMERGENCY DEPARTMENT Provider Note   CSN: [id number] Arrival date & time: 09/15/17  1742     History   Chief Complaint Chief Complaint  Patient presents with  . Suicidal    HPI Sheri Hall is a 47 y.o. female.  HPI  47 year old female presents today with complaints of depression and suicidal ideation.  Patient notes a history of bipolar disorder, depression, not currently taking her medications.  She notes the depression has worsened causing her to use crack cocaine and alcohol to cope with her feelings.  She notes she has not used these drugs in 2 days she would like to stop using them.  She notes the depression associative year that she thinks that she would be better off not being alive.  She denies any plans, denies any history of suicide attempts in the past.  Patient denies any physical complaints other than chronic hand pain secondary to rheumatoid arthritis, and also redness in the right eye.  She notes this happened 2 days ago with foreign body sensation in the right eye itching and watery history of the same.  Patient does not wear contact lenses.  Past Medical History:  Diagnosis Date  . Anxiety   . Arthritis    rheumatoid   . Asthma    seasonal  . Bipolar disorder (HCC)   . Depression   . GERD (gastroesophageal reflux disease)   . OCD (obsessive compulsive disorder)   . PTSD (post-traumatic stress disorder)     There are no active problems to display for this patient.   Past Surgical History:  Procedure Laterality Date  . CESAREAN SECTION    . OPEN REDUCTION INTERNAL FIXATION (ORIF) DISTAL RADIAL FRACTURE Left 10/15/2015   Procedure: OPEN TREATMENT OF LEFT DISTAL RADIUS FRACTURE;  Surgeon: Mack Hook, MD;  Location: [facility];  Service: Orthopedics;  Laterality: Left;    OB History    No data available       Home Medications    Prior to Admission medications   Medication Sig Start Date End Date  Taking? Authorizing Provider  benzonatate (TESSALON) 100 MG capsule Take 1 capsule (100 mg total) by mouth 3 (three) times daily as needed for cough. Patient not taking: Reported on 09/15/2017 01/11/17   Shaune Pollack, MD  docusate sodium (COLACE) 100 MG capsule Take 1 capsule (100 mg total) by mouth daily as needed for mild constipation. Patient not taking: Reported on 11/13/2015 10/15/15   Mack Hook, MD  HYDROcodone-acetaminophen (NORCO/VICODIN) 5-325 MG tablet Take 1-2 tablets by mouth every 4 (four) hours as needed for severe pain. Patient not taking: Reported on 09/15/2017 01/11/17   Shaune Pollack, MD  oxyCODONE-acetaminophen (PERCOCET/ROXICET) 5-325 MG tablet Take 1-2 tablets by mouth every 6 (six) hours as needed for severe pain (max 8 per day). Patient not taking: Reported on 11/13/2015 10/15/15   Mack Hook, MD    Family History No family history on file.  Social History Social History   Tobacco Use  . Smoking status: Current Every Day Smoker    Packs/day: 0.50    Types: Cigarettes  Substance Use Topics  . Alcohol use: Yes    Comment: occ   . Drug use: Yes    Types: Cocaine    Comment: wednesday March 22nd 2017     Allergies   Patient has no known allergies.   Review of Systems Review of Systems  All other systems reviewed and are negative.    Physical Exam  Updated Vital Signs BP 134/86 (BP Location: Right Arm)   Pulse (!) 101   Temp 98.1 F (36.7 C) (Oral)   Resp 16   SpO2 100%   Physical Exam  Constitutional: She is oriented to person, place, and time. She appears well-developed and well-nourished.  HENT:  Head: Normocephalic and atraumatic.  Bulbar and palpebral conjunctival injection, cornea without any defects, no uptake on fluorescein exam, extraocular movements are intact and pain-free watery discharge noted  Eyes: Conjunctivae are normal. Pupils are equal, round, and reactive to light. Right eye exhibits no discharge. Left eye exhibits no  discharge. No scleral icterus.  Neck: Normal range of motion. No JVD present. No tracheal deviation present.  Pulmonary/Chest: Effort normal. No stridor.  Neurological: She is alert and oriented to person, place, and time. Coordination normal.  Psychiatric: She has a normal mood and affect. Her behavior is normal. Judgment and thought content normal.  Nursing note and vitals reviewed.    ED Treatments / Results  Labs (all labs ordered are listed, but only abnormal results are displayed) Labs Reviewed  COMPREHENSIVE METABOLIC PANEL - Abnormal; Notable for the following components:      Result Value   Glucose, Bld 103 (*)    Calcium 8.4 (*)    Albumin 3.0 (*)    All other components within normal limits  RAPID URINE DRUG SCREEN, HOSP PERFORMED - Abnormal; Notable for the following components:   Cocaine POSITIVE (*)    All other components within normal limits  ETHANOL  CBC  I-STAT BETA HCG BLOOD, ED ([facility], [facility], [facility] ONLY)    EKG  EKG Interpretation None       Radiology No results found.  Procedures Procedures (including critical care time)  Medications Ordered in ED Medications  tetracaine (PONTOCAINE) 0.5 % ophthalmic solution 1 drop (not administered)  fluorescein ophthalmic strip 1 strip (not administered)  acetaminophen (TYLENOL) tablet 650 mg (not administered)  erythromycin ophthalmic ointment 1 application (not administered)     Initial Impression / Assessment and Plan / ED Course  I have reviewed the triage vital signs and the nursing notes.  Pertinent labs & imaging results that were available during my care of the patient were reviewed by me and considered in my medical decision making (see chart for details).      Final Clinical Impressions(s) / ED Diagnoses   Final diagnoses:  Acute conjunctivitis of right eye, unspecified acute conjunctivitis type  Suicidal ideation    Labs: Rapid urine drug screen, i-STAT beta-hCG, ethanol,  CMP  Imaging:  Consults:  Therapeutics: Tetracaine  Discharge Meds:   Assessment/Plan:   47 year old female presents today with suicidal ideation.  Patient also has conjunctivitis.  She will be treated with erythromycin.  She is medically cleared and will be placed in psychiatric holding area pending TTS evaluation.    ED Discharge Orders    None       Rosalio Loud 09/15/17 2127    Vanetta Mulders, MD 09/16/17 [phone]

## 2017-09-15 NOTE — ED Notes (Signed)
Tono pen, Woods lamp, tetracaine, and fluorescein strip by the bedside

## 2017-09-15 NOTE — ED Notes (Signed)
Pt reports alcohol and "crack" use, last used both on Sunday.

## 2017-09-15 NOTE — ED Notes (Addendum)
Pt belongings inventoried by this RN and inventory sheet signed by pt. Valuables envelopes (2) locked in safe by security. Pt belongings placed in locker # 5

## 2017-09-15 NOTE — ED Triage Notes (Signed)
Pt arrives to ED with suicidal thoughts and reports depression. Pt crying in triage and states she just needs help.

## 2017-09-15 NOTE — ED Notes (Signed)
Pt given gingerale and crackers 

## 2017-09-16 ENCOUNTER — Other Ambulatory Visit: Payer: Self-pay

## 2017-09-16 ENCOUNTER — Encounter (HOSPITAL_COMMUNITY): Payer: Self-pay | Admitting: *Deleted

## 2017-09-16 ENCOUNTER — Inpatient Hospital Stay (HOSPITAL_COMMUNITY)
Admission: AD | Admit: 2017-09-16 | Discharge: 2017-09-21 | DRG: 885 | Disposition: A | Discharge status: Home / Self Care | Payer: Medicaid Other | Source: Intra-hospital | Attending: Psychiatry | Admitting: Psychiatry

## 2017-09-16 DIAGNOSIS — F1994 Other psychoactive substance use, unspecified with psychoactive substance-induced mood disorder: Secondary | ICD-10-CM | POA: Diagnosis not present

## 2017-09-16 DIAGNOSIS — F1490 Cocaine use, unspecified, uncomplicated: Secondary | ICD-10-CM | POA: Diagnosis present

## 2017-09-16 DIAGNOSIS — F429 Obsessive-compulsive disorder, unspecified: Secondary | ICD-10-CM | POA: Diagnosis present

## 2017-09-16 DIAGNOSIS — F4321 Adjustment disorder with depressed mood: Secondary | ICD-10-CM | POA: Diagnosis not present

## 2017-09-16 DIAGNOSIS — M069 Rheumatoid arthritis, unspecified: Secondary | ICD-10-CM | POA: Diagnosis present

## 2017-09-16 DIAGNOSIS — K219 Gastro-esophageal reflux disease without esophagitis: Secondary | ICD-10-CM | POA: Diagnosis present

## 2017-09-16 DIAGNOSIS — Z975 Presence of (intrauterine) contraceptive device: Secondary | ICD-10-CM | POA: Diagnosis not present

## 2017-09-16 DIAGNOSIS — F322 Major depressive disorder, single episode, severe without psychotic features: Secondary | ICD-10-CM | POA: Diagnosis not present

## 2017-09-16 DIAGNOSIS — F4310 Post-traumatic stress disorder, unspecified: Secondary | ICD-10-CM

## 2017-09-16 DIAGNOSIS — F332 Major depressive disorder, recurrent severe without psychotic features: Secondary | ICD-10-CM | POA: Diagnosis present

## 2017-09-16 DIAGNOSIS — F419 Anxiety disorder, unspecified: Secondary | ICD-10-CM | POA: Diagnosis present

## 2017-09-16 DIAGNOSIS — F17210 Nicotine dependence, cigarettes, uncomplicated: Secondary | ICD-10-CM | POA: Diagnosis present

## 2017-09-16 DIAGNOSIS — Z813 Family history of other psychoactive substance abuse and dependence: Secondary | ICD-10-CM | POA: Diagnosis not present

## 2017-09-16 DIAGNOSIS — G4700 Insomnia, unspecified: Secondary | ICD-10-CM | POA: Diagnosis not present

## 2017-09-16 DIAGNOSIS — F1990 Other psychoactive substance use, unspecified, uncomplicated: Secondary | ICD-10-CM | POA: Diagnosis not present

## 2017-09-16 DIAGNOSIS — F329 Major depressive disorder, single episode, unspecified: Secondary | ICD-10-CM | POA: Diagnosis not present

## 2017-09-16 DIAGNOSIS — Z6379 Other stressful life events affecting family and household: Secondary | ICD-10-CM | POA: Diagnosis not present

## 2017-09-16 DIAGNOSIS — Z634 Disappearance and death of family member: Secondary | ICD-10-CM | POA: Diagnosis not present

## 2017-09-16 MED ORDER — ONDANSETRON HCL 4 MG PO TABS
4.0000 mg | ORAL_TABLET | Freq: Three times a day (TID) | ORAL | Status: DC | PRN
Start: 2017-09-16 — End: 2017-09-16

## 2017-09-16 MED ORDER — ERYTHROMYCIN 5 MG/GM OP OINT
TOPICAL_OINTMENT | Freq: Four times a day (QID) | OPHTHALMIC | Status: DC
  Administered 2017-09-16 – 2017-09-21 (×14): via OPHTHALMIC
  Filled 2017-09-16 (×2): qty 3.5

## 2017-09-16 MED ORDER — TRAZODONE HCL 50 MG PO TABS: 50.0000 mg | ORAL_TABLET | Freq: Every evening | ORAL | Status: DC | PRN

## 2017-09-16 MED ORDER — NICOTINE 21 MG/24HR TD PT24: 21.0000 mg | MEDICATED_PATCH | Freq: Every day | TRANSDERMAL | Status: DC

## 2017-09-16 MED ORDER — ZIPRASIDONE MESYLATE 20 MG IM SOLR
20.0000 mg | INTRAMUSCULAR | Status: DC | PRN
Start: 2017-09-16 — End: 2017-09-16

## 2017-09-16 MED ORDER — LORAZEPAM 1 MG PO TABS
1.0000 mg | ORAL_TABLET | Freq: Once | ORAL | Status: AC
  Administered 2017-09-16: 1 mg via ORAL
  Filled 2017-09-16: qty 1

## 2017-09-16 MED ORDER — MAGNESIUM HYDROXIDE 400 MG/5ML PO SUSP: 30.0000 mL | Freq: Every day | ORAL | Status: DC | PRN

## 2017-09-16 MED ORDER — OLANZAPINE 5 MG PO TBDP
10.0000 mg | ORAL_TABLET | Freq: Three times a day (TID) | ORAL | Status: DC | PRN
  Filled 2017-09-16: qty 1

## 2017-09-16 MED ORDER — LORAZEPAM 1 MG PO TABS: 1.0000 mg | ORAL_TABLET | ORAL | Status: DC | PRN

## 2017-09-16 MED ORDER — HYDROXYZINE HCL 25 MG PO TABS
25.0000 mg | ORAL_TABLET | Freq: Four times a day (QID) | ORAL | Status: DC | PRN
  Administered 2017-09-17 – 2017-09-19 (×2): 25 mg via ORAL
  Filled 2017-09-16 (×3): qty 1

## 2017-09-16 MED ORDER — ALUM & MAG HYDROXIDE-SIMETH 200-200-20 MG/5ML PO SUSP: 30.0000 mL | ORAL | Status: DC | PRN

## 2017-09-16 MED ORDER — ACETAMINOPHEN 325 MG PO TABS: 650.0000 mg | ORAL_TABLET | Freq: Four times a day (QID) | ORAL | Status: DC | PRN

## 2017-09-16 NOTE — [facility] Group Notes (Signed)
[facility] Group Notes:  (Nursing/MHT/Case Management/Adjunct)  Date:  09/16/2017  Time:  1630  Type of Therapy:  Nurse Education  Participation Level:  Did Not Attend  Participation Quality:    Affect:    Cognitive:    Insight:    Engagement in Group:    Modes of Intervention:    Summary of Progress/Problems:  Patient arrived on unit from admission room at start of group. Did not attend.  Lawrence Marseilles 09/16/2017, 5:37 PM

## 2017-09-16 NOTE — Tx Team (Signed)
Initial Treatment Plan 09/16/2017 4:45 PM [name] [id number]    PATIENT STRESSORS: Medication change or noncompliance Substance abuse Other: 2 sons   PATIENT STRENGTHS: Ability for insight Average or above average intelligence Capable of independent living General fund of knowledge Motivation for treatment/growth   PATIENT IDENTIFIED PROBLEMS: Depression Anxiety Substance Abuse Suicidal thoughts "I haven't had any grief counseling"                     DISCHARGE CRITERIA:  Ability to meet basic life and health needs Improved stabilization in mood, thinking, and/or behavior Verbal commitment to aftercare and medication compliance Withdrawal symptoms are absent or subacute and managed without 24-hour nursing intervention  PRELIMINARY DISCHARGE PLAN: Attend aftercare/continuing care group Return to previous living arrangement  PATIENT/FAMILY INVOLVEMENT: This treatment plan has been presented to and reviewed with the patient, Sheri Hall, and/or family member, .  The patient and family have been given the opportunity to ask questions and make suggestions.  Sheri Hall, [place], [place] 09/16/2017, 4:45 PM

## 2017-09-16 NOTE — Progress Notes (Addendum)
The patient had been crying most part of the evening. She was very sad. She said missed her two sons that were murdered and could not stop crying for them. She stated " we knew who killed the first one, but we don't know who killed the second one and that made it worse". She said she used to live in [place] and that was where it happened. She said she had 7 sons and 2 were killed. She endorsed poor sleep pattern " I can't sleep, I keep waking up and crying". She reported that the 47 years old was killed in 2013 and the 47 years old killed in 2017 and she thinks about them all the time "wondering why". Writer encouraged and supported patient. She said she had not been into any therapy; though her church is supportive, she said she is not getting much help from the church either. Writer encouraged patient to consider grief therapy/ psychotherapy. She denied SI/Hi and denied withdrawal symptoms.

## 2017-09-16 NOTE — Progress Notes (Signed)
Pt accepted to Bed 303-1 Donell Sievert, PA is the accepting provider.  Dr. Emeline Darling is the attending provider.  Call report to [phone]  [facility] ED notified.   Pt is Voluntary.  Pt may be transported by [facility] Pt scheduled  to arrive at [facility] as soon as transport  Carney Bern T. Kaylyn Lim, MSW, LCSWA Disposition Clinical Social Work [phone] (cell) [phone] (office)

## 2017-09-16 NOTE — ED Notes (Signed)
TTS interview in progress.  

## 2017-09-16 NOTE — Progress Notes (Signed)
Sheri Hall is a 47 year old female pt admitted on voluntary basis. On admission, she is tearful and sad and spoke about how she has been using crack and alcohol to help deal with her depression. She spoke about the death of her 2 sons and spoke about how she has not had any grief counseling. She reports that she has been prescribed medications but reports that she does not take them. She reports that she has been hospitalized in the past and reports the last time was in [place] about 5 or 6 years ago. She reports that she lives with her son and reports that she will go back there at discharge. She does endorse passive SI and A/V hallucinations on admission but able to contract for safety while in the hospital. She is also wanting to get outpatient resources when she gets discharged. She was oriented to the unit and safety maintained.

## 2017-09-16 NOTE — Progress Notes (Signed)
Tech asked if they would like to change attire and bedding and patient stated that they would like to speak to someone in charge.
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
## 2017-09-17 DIAGNOSIS — K219 Gastro-esophageal reflux disease without esophagitis: Secondary | ICD-10-CM

## 2017-09-17 DIAGNOSIS — F1490 Cocaine use, unspecified, uncomplicated: Secondary | ICD-10-CM

## 2017-09-17 DIAGNOSIS — F4310 Post-traumatic stress disorder, unspecified: Secondary | ICD-10-CM

## 2017-09-17 DIAGNOSIS — F4321 Adjustment disorder with depressed mood: Secondary | ICD-10-CM

## 2017-09-17 DIAGNOSIS — Z634 Disappearance and death of family member: Secondary | ICD-10-CM

## 2017-09-17 DIAGNOSIS — Z813 Family history of other psychoactive substance abuse and dependence: Secondary | ICD-10-CM

## 2017-09-17 DIAGNOSIS — F1990 Other psychoactive substance use, unspecified, uncomplicated: Secondary | ICD-10-CM

## 2017-09-17 DIAGNOSIS — Z6379 Other stressful life events affecting family and household: Secondary | ICD-10-CM

## 2017-09-17 DIAGNOSIS — F17210 Nicotine dependence, cigarettes, uncomplicated: Secondary | ICD-10-CM

## 2017-09-17 DIAGNOSIS — F322 Major depressive disorder, single episode, severe without psychotic features: Secondary | ICD-10-CM

## 2017-09-17 DIAGNOSIS — F1994 Other psychoactive substance use, unspecified with psychoactive substance-induced mood disorder: Secondary | ICD-10-CM

## 2017-09-17 MED ORDER — VENLAFAXINE HCL ER 75 MG PO CP24
75.0000 mg | ORAL_CAPSULE | Freq: Every day | ORAL | Status: DC
  Administered 2017-09-18 – 2017-09-21 (×4): 75 mg via ORAL
  Filled 2017-09-17 (×5): qty 1

## 2017-09-17 MED ORDER — ARIPIPRAZOLE 5 MG PO TABS
5.0000 mg | ORAL_TABLET | Freq: Every day | ORAL | Status: DC
  Administered 2017-09-17 – 2017-09-20 (×4): 5 mg via ORAL
  Filled 2017-09-17 (×6): qty 1

## 2017-09-17 MED ORDER — VENLAFAXINE HCL ER 37.5 MG PO CP24
37.5000 mg | ORAL_CAPSULE | Freq: Once | ORAL | Status: AC
  Administered 2017-09-17: 37.5 mg via ORAL
  Filled 2017-09-17 (×2): qty 1

## 2017-09-17 MED ORDER — PRAZOSIN HCL 2 MG PO CAPS
2.0000 mg | ORAL_CAPSULE | Freq: Every day | ORAL | Status: DC
  Administered 2017-09-17 – 2017-09-20 (×3): 2 mg via ORAL
  Filled 2017-09-17 (×5): qty 1

## 2017-09-17 NOTE — BHH Group Notes (Signed)
BHH Group Notes: Aromatherapy   Date:  09/17/2017  Time:  4:28 PM  Type of Therapy:  Psychoeducational Skills  Participation Level:  Did Not Attend  Participation Quality:  Did not attend   Affect:  Did not attend   Cognitive:  Did not attend  Insight:  None  Engagement in Group:  None  Modes of Intervention:  Activity, Discussion and Education  Summary of Progress/Problems: Patient did not attend group. 
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
## 2017-09-17 NOTE — BHH Group Notes (Signed)
LCSW Group Therapy Note  09/17/2017 1:15pm  Type of Therapy and Topic:  Group Therapy:  Feelings around Relapse and Recovery  Participation Level:  None  Description of Group:    Patients in this group will discuss emotions they experience before and after a relapse. They will process how experiencing these feelings, or avoidance of experiencing them, relates to having a relapse. Facilitator will guide patients to explore emotions they have related to recovery. Patients will be encouraged to process which emotions are more powerful. They will be guided to discuss the emotional reaction significant others in their lives may have to their relapse or recovery. Patients will be assisted in exploring ways to respond to the emotions of others without this contributing to a relapse.  Therapeutic Goals: 1. Patient will identify two or more emotions that lead to a relapse for them 2. Patient will identify two emotions that result when they relapse 3. Patient will identify two emotions related to recovery 4. Patient will demonstrate ability to communicate their needs through discussion and/or role plays   Summary of Patient Progress:  Pt did not actively participate in group discussion and was writing in her journal during group. When prompted by CSW, pt declined to speak. Pt continues to presents with paranoid affect and irritable mood. At this time, pt does not appear to be making progress in the group setting and presents with limited insight.    Therapeutic Modalities:   Cognitive Behavioral Therapy Solution-Focused Therapy Assertiveness Training Relapse Prevention Therapy   Ledell Peoples Smart, LCSW 09/17/2017 12:49 PM

## 2017-09-17 NOTE — BHH Suicide Risk Assessment (Signed)
[facility] Admission Suicide Risk Assessment   Nursing information obtained from:    Demographic factors:    Current Mental Status:    Loss Factors:    Historical Factors:    Risk Reduction Factors:     Total Time spent with patient: 45 minutes Principal Problem: MDD (major depressive disorder), severe (HCC) Diagnosis:   Patient Active Problem List   Diagnosis Date Noted  . PTSD (post-traumatic stress disorder) [F43.10] 09/17/2017  . Substance use disorder [F19.90] 09/17/2017  . Grief [F43.21] 09/17/2017  . MDD (major depressive disorder), severe (HCC) [F32.2] 09/16/2017   Subjective Data:  47 y.o AAF single, lives with her family. Background history of Early life trauma, MDD, SUD and unresolved grief. Presented to the ER in company of her daughter. Patient was very tearful and distraught. She had been getting more depressed. She lost two of her sons within four years. She has not been coping well. She has been using cocaine and alcohol to cope. Routine labs significant for low calcium and albumin. Toxicology is negative. UDS is is positive for cocaine. BAL<5 mg/dl. No past suicidal behavior, no family history of suicide, no evidence of psychosis. No evidence of mania. No cognitive impairment. No access to weapons. She is cooperative with care. She has agreed to treatment recommendations. She has agreed to communicate suicidal thoughts to staff if the thoughts becomes overwhelming.    Continued Clinical Symptoms:  Alcohol Use Disorder Identification Test Final Score (AUDIT): 20 The "Alcohol Use Disorders Identification Test", Guidelines for Use in Primary Care, Second Edition.  World Science writer [facility]). Score between 0-7:  no or low risk or alcohol related problems. Score between 8-15:  moderate risk of alcohol related problems. Score between 16-19:  high risk of alcohol related problems. Score 20 or above:  warrants further diagnostic evaluation for alcohol dependence and  treatment.   CLINICAL FACTORS:   Depression:   Severe   Musculoskeletal: Strength & Muscle Tone: within normal limits Gait & Station: normal Patient leans: N/A  Psychiatric Specialty Exam: Physical Exam  ROS  Blood pressure (!) 133/92, pulse 74, temperature (!) 97.4 F (36.3 C), temperature source Oral, resp. rate 16, height 5' 2.5" (1.588 m), weight 61.7 kg (136 lb).Body mass index is 24.48 kg/m.  General Appearance: As in H&P  Eye Contact:    Speech:    Volume:    Mood:    Affect:    Thought Process:    Orientation:    Thought Content:    Suicidal Thoughts:  As in H&P  Homicidal Thoughts:    Memory:    Judgement:    Insight:    Psychomotor Activity:    Concentration:    Recall:    Fund of Knowledge:    Language:    Akathisia:    Handed:    AIMS (if indicated):     Assets:    ADL's:    Cognition:  As in H&P  Sleep:  Number of Hours: 5      COGNITIVE FEATURES THAT CONTRIBUTE TO RISK:  None    SUICIDE RISK:   Minimal: No identifiable suicidal ideation.  Patients presenting with no risk factors but with morbid ruminations; may be classified as minimal risk based on the severity of the depressive symptoms  PLAN OF CARE:  As in H&P  I certify that inpatient services furnished can reasonably be expected to improve the patient's condition.   Georgiann Cocker, MD 09/17/2017, 2:27 PM

## 2017-09-17 NOTE — Progress Notes (Signed)
D: Patient observed isolative to room. Remains flat, sad and depressed both in affect and mood. When asked about suicidal thoughts and AVH patient states, "I'm alright today." Per self inventory and discussions with writer, rates depression at a 5/10, hopelessness at a 5/10 and anxiety at an 8/10. Rates sleep as poor, appetite as fair, energy as low and concentration as good.  States goal for today is "focusing on my recovery." Denies pain, physical complaints.   A: Medicated per orders, no prns requested or required. Level III obs in place for safety. Emotional support offered and self inventory reviewed. Encouraged completion of Suicide Safety Plan and programming participation.   R: Patient verbalizes understanding of POC. Patient states she attempted to attend AA but felt "they were glorifying their addiction, how many drinks they can take. That's not what I need." Patient denies SI/HI/AVH and remains safe on level III obs. Will continue to monitor closely and make verbal contact frequently.

## 2017-09-17 NOTE — Progress Notes (Signed)
Patient came up to nurses' station expressing anxiety, on the verge of tears. Walked patient over to private space and spoke with her 1:1. Patient states, "what is this? This happens to me and I don't know why. I wake up in a panic and everything feels jittery and restless. I cry all the time. I've been on medications before but never stayed with it. I hate the idea of taking something everyday." Explained to patient this is part of her illness and unresolved grief. Reminded patient that if she were to have diabetes she would take medications everyday and patient agreed. Strongly encouraged patient to remain compliant. Educated patient on breathing exercise and demonstrated with patient. Med education provided on first doses of abilify and effexor and gave patient prn dose of vistaril. Patient verbalized understanding and stated, "my body is already feeling better. The breathing seems to be helping." Report given to Jan, RN who will follow up with patient.

## 2017-09-17 NOTE — Progress Notes (Signed)
Psychoeducational Group Note  Date:  09/17/2017 Time: 2030   Group Topic/Focus:  wrap up group  Participation Level: Did Not Attend  Participation Quality:  Not Applicable  Affect:  Not Applicable  Cognitive:  Not Applicable  Insight:  Not Applicable  Engagement in Group: Not Applicable  Additional Comments:  Pt was notified that group was beginning but remained in bed.   Marcille Buffy 09/17/2017, 9:29 PM

## 2017-09-17 NOTE — BHH Counselor (Signed)
Writer attempted to complete PSA with patient. Patient appeared to be sleeping. Writer will follow up later this afternoon.
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
## 2017-09-17 NOTE — Progress Notes (Signed)
Pt attended morning Orientation/Daily Goals Group and stated that for the day she would like to work towards focusing on her recovery. Pt is aware of the rules of the unit and has agreed to abide by them. She did attempt to attend the morning AA group but was unable to remain in the room.

## 2017-09-17 NOTE — Plan of Care (Signed)
Patient verbalizes understanding of information, education provided. 

## 2017-09-18 NOTE — [facility] Group Notes (Signed)
B[facility] Group Notes:  (Nursing/MHT/Case Management/Adjunct)  Date:  09/18/2017  Time:  4:00 p.m.  Type of Therapy:  Psychoeducational Skills  Participation Level:  Active  Participation Quality:  Appropriate  Affect:  Appropriate  Cognitive:  Appropriate  Insight:  Appropriate  Engagement in Group:  Engaged  Modes of Intervention:  Education  Summary of Progress/Problems:  Patient actively participated in educational group.    Earline Mayotte 09/18/2017, 5:02 PM

## 2017-09-18 NOTE — B[facility] Suicide Risk Assessment (Signed)
B[facility] INPATIENT:  Family/Significant Other Suicide Prevention Education  Suicide Prevention Education:  Contact Attempts: [name] (pt's mother) [phone] has been identified by the patient as the family member/significant other with whom the patient will be residing, and identified as the person(s) who will aid the patient in the event of a mental health crisis.  With written consent from the patient, two attempts were made to provide suicide prevention education, prior to and/or following the patient's discharge.  We were unsuccessful in providing suicide prevention education.  A suicide education pamphlet was given to the patient to share with family/significant other.  Date and time of first attempt: 1:42PM on 09/18/17.    Sheri Hall N Smart 09/18/2017, 1:42 PM

## 2017-09-18 NOTE — Tx Team (Signed)
Interdisciplinary Treatment and Diagnostic Plan Update  09/18/2017 Time of Session: 0160FU Sheri Hall MRN: [id number]  Principal Diagnosis: MDD (major depressive disorder), severe (HCC)  Secondary Diagnoses: Principal Problem:   MDD (major depressive disorder), severe (HCC) Active Problems:   PTSD (post-traumatic stress disorder)   Substance use disorder   Grief   Current Medications:  Current Facility-Administered Medications  Medication Dose Route Frequency Provider Last Rate Last Dose  . acetaminophen (TYLENOL) tablet 650 mg  650 mg Oral Q6H PRN Rankin, Shuvon B, NP      . alum & mag hydroxide-simeth (MAALOX/MYLANTA) 200-200-20 MG/5ML suspension 30 mL  30 mL Oral Q4H PRN Rankin, Shuvon B, NP      . ARIPiprazole (ABILIFY) tablet 5 mg  5 mg Oral Daily Izediuno, Delight Ovens, MD   5 mg at 09/18/17 0801  . erythromycin ophthalmic ointment   Right Eye Q6H Rankin, Shuvon B, NP      . hydrOXYzine (ATARAX/VISTARIL) tablet 25 mg  25 mg Oral Q6H PRN Rankin, Shuvon B, NP   25 mg at 09/17/17 1456  . magnesium hydroxide (MILK OF MAGNESIA) suspension 30 mL  30 mL Oral Daily PRN Rankin, Shuvon B, NP      . prazosin (MINIPRESS) capsule 2 mg  2 mg Oral QHS Izediuno, Delight Ovens, MD   2 mg at 09/17/17 2114  . traZODone (DESYREL) tablet 50 mg  50 mg Oral QHS PRN Rankin, Shuvon B, NP      . venlafaxine XR (EFFEXOR-XR) 24 hr capsule 75 mg  75 mg Oral Daily Izediuno, Delight Ovens, MD   75 mg at 09/18/17 0801   PTA Medications: Medications Prior to Admission  Medication Sig Dispense Refill Last Dose  . [DISCONTINUED] benzonatate (TESSALON) 100 MG capsule Take 1 capsule (100 mg total) by mouth 3 (three) times daily as needed for cough. (Patient not taking: Reported on 09/15/2017) 21 capsule 0 Completed Course at Unknown time  . [DISCONTINUED] docusate sodium (COLACE) 100 MG capsule Take 1 capsule (100 mg total) by mouth daily as needed for mild constipation. (Patient not taking: Reported on 11/13/2015) 30  capsule 0 Completed Course at Unknown time  . [DISCONTINUED] HYDROcodone-acetaminophen (NORCO/VICODIN) 5-325 MG tablet Take 1-2 tablets by mouth every 4 (four) hours as needed for severe pain. (Patient not taking: Reported on 09/15/2017) 12 tablet 0 Completed Course at Unknown time  . [DISCONTINUED] oxyCODONE-acetaminophen (PERCOCET/ROXICET) 5-325 MG tablet Take 1-2 tablets by mouth every 6 (six) hours as needed for severe pain (max 8 per day). (Patient not taking: Reported on 11/13/2015) 96 tablet 0 Completed Course at Unknown time    Patient Stressors: Medication change or noncompliance Substance abuse Other: 2 sons  Patient Strengths: Ability for insight Average or above average intelligence Capable of independent living General fund of knowledge Motivation for treatment/growth  Treatment Modalities: Medication Management, Group therapy, Case management,  1 to 1 session with clinician, Psychoeducation, Recreational therapy.   Physician Treatment Plan for Primary Diagnosis: MDD (major depressive disorder), severe (HCC) Long Term Goal(s): Improvement in symptoms so as ready for discharge Improvement in symptoms so as ready for discharge   Short Term Goals: Ability to identify changes in lifestyle to reduce recurrence of condition will improve Ability to verbalize feelings will improve Ability to disclose and discuss suicidal ideas Ability to demonstrate self-control will improve Ability to identify and develop effective coping behaviors will improve Ability to maintain clinical measurements within normal limits will improve Compliance with prescribed medications will improve Ability to identify triggers  associated with substance abuse/mental health issues will improve Ability to identify changes in lifestyle to reduce recurrence of condition will improve Ability to verbalize feelings will improve Ability to disclose and discuss suicidal ideas Ability to demonstrate self-control will  improve Ability to identify and develop effective coping behaviors will improve Ability to maintain clinical measurements within normal limits will improve Compliance with prescribed medications will improve Ability to identify triggers associated with substance abuse/mental health issues will improve  Medication Management: Evaluate patient's response, side effects, and tolerance of medication regimen.  Therapeutic Interventions: 1 to 1 sessions, Unit Group sessions and Medication administration.  Evaluation of Outcomes: Progressing  Physician Treatment Plan for Secondary Diagnosis: Principal Problem:   MDD (major depressive disorder), severe (HCC) Active Problems:   PTSD (post-traumatic stress disorder)   Substance use disorder   Grief  Long Term Goal(s): Improvement in symptoms so as ready for discharge Improvement in symptoms so as ready for discharge   Short Term Goals: Ability to identify changes in lifestyle to reduce recurrence of condition will improve Ability to verbalize feelings will improve Ability to disclose and discuss suicidal ideas Ability to demonstrate self-control will improve Ability to identify and develop effective coping behaviors will improve Ability to maintain clinical measurements within normal limits will improve Compliance with prescribed medications will improve Ability to identify triggers associated with substance abuse/mental health issues will improve Ability to identify changes in lifestyle to reduce recurrence of condition will improve Ability to verbalize feelings will improve Ability to disclose and discuss suicidal ideas Ability to demonstrate self-control will improve Ability to identify and develop effective coping behaviors will improve Ability to maintain clinical measurements within normal limits will improve Compliance with prescribed medications will improve Ability to identify triggers associated with substance abuse/mental health  issues will improve     Medication Management: Evaluate patient's response, side effects, and tolerance of medication regimen.  Therapeutic Interventions: 1 to 1 sessions, Unit Group sessions and Medication administration.  Evaluation of Outcomes: Progressing   RN Treatment Plan for Primary Diagnosis: MDD (major depressive disorder), severe (HCC) Long Term Goal(s): Knowledge of disease and therapeutic regimen to maintain health will improve  Short Term Goals: Ability to remain free from injury will improve, Ability to disclose and discuss suicidal ideas and Ability to identify and develop effective coping behaviors will improve  Medication Management: RN will administer medications as ordered by provider, will assess and evaluate patient's response and provide education to patient for prescribed medication. RN will report any adverse and/or side effects to prescribing provider.  Therapeutic Interventions: 1 on 1 counseling sessions, Psychoeducation, Medication administration, Evaluate responses to treatment, Monitor vital signs and CBGs as ordered, Perform/monitor CIWA, COWS, AIMS and Fall Risk screenings as ordered, Perform wound care treatments as ordered.  Evaluation of Outcomes: Progressing   LCSW Treatment Plan for Primary Diagnosis: MDD (major depressive disorder), severe (HCC) Long Term Goal(s): Safe transition to appropriate next level of care at discharge, Engage patient in therapeutic group addressing interpersonal concerns.  Short Term Goals: Engage patient in aftercare planning with referrals and resources, Facilitate patient progression through stages of change regarding substance use diagnoses and concerns and Identify triggers associated with mental health/substance abuse issues  Therapeutic Interventions: Assess for all discharge needs, 1 to 1 time with Social worker, Explore available resources and support systems, Assess for adequacy in community support network, Educate  family and significant other(s) on suicide prevention, Complete Psychosocial Assessment, Interpersonal group therapy.  Evaluation of Outcomes: Progressing  Progress in Treatment: Attending groups: Intermittently Participating in groups: No Taking medication as prescribed: Yes. Toleration medication: Yes. Family/Significant other contact made: No, will contact:  family member if patient consents to collateral contact. Patient understands diagnosis: Yes. Discussing patient identified problems/goals with staff: Yes. Medical problems stabilized or resolved: Yes. Denies suicidal/homicidal ideation: Yes. Issues/concerns per patient self-inventory: No. Other: n/a   New problem(s) identified: No, Describe:  n/a  New Short Term/Long Term Goal(s): detox, medication management for mood stabilization; elimination of SI thoughts; development of comprehensive mental wellness/sobriety plan.   Patient Goal: "to get help with my addiction. I need intensive outpatient."   Discharge Plan or Barriers: CSW assessing for appropriate referrals. She will likely discharge to [facility]. [facility] pamphlet and Mobile Crisis information provided to pt for additional community support.   Reason for Continuation of Hospitalization: Anxiety Depression Medication stabilization Withdrawal symptoms  Estimated Length of Stay: Monday, 09/21/17  Attendees: Patient: Sheri Hall 09/18/2017 9:09 AM  Physician: Dr. Altamese Prineville MD; Dr. Jackquline Berlin MD 09/18/2017 9:09 AM  Nursing: Judithann Sauger; Ewa Gentry RN  09/18/2017 9:09 AM  RN Care Manager:x 09/18/2017 9:09 AM  Social Worker: Chartered loss adjuster, LCSW 09/18/2017 9:09 AM  Recreational Therapist: x 09/18/2017 9:09 AM  Other: Armandina Stammer NP 09/18/2017 9:09 AM  Other:  09/18/2017 9:09 AM  Other: 09/18/2017 9:09 AM    Scribe for Treatment Team: Ledell Peoples Smart, LCSW 09/18/2017 9:09 AM

## 2017-09-18 NOTE — Progress Notes (Signed)
Recreation Therapy Notes  Date: 09/18/17 Time: 0930 Location: [address] Dayroom  Group Topic: Stress Management  Goal Area(s) Addresses:  Patient will verbalize importance of using healthy stress management.  Patient will identify positive emotions associated with healthy stress management.   Behavioral Response: Engaged  Intervention: Stress Management  Activity :  Body Scan.  LRT introduced the stress management technique meditation.  LRT played a body scan meditation to allow patients to examine any sensations or tension they may have been feeling.  Patients were to follow along as the meditation played.  Education:  Stress Management, Discharge Planning.   Education Outcome: Acknowledges edcuation/In group clarification offered/Needs additional education  Clinical Observations/Feedback: Pt attended group.    Caroll Rancher, LRT/CTRS    Caroll Rancher A 09/18/2017 10:34 AM

## 2017-09-18 NOTE — Plan of Care (Signed)
Patient has not engaged in self harm and denies thoughts to do so. Verbal contract in place.

## 2017-09-18 NOTE — Progress Notes (Signed)
D: Patient observed isolative to room much of the day. Sad and withdrawn. Flat and depressed both in affect and mood. Patient states "I am doing a little better, less anxious but I am extremely dizzy. I don't know what it is. I did start those new meds yesterday."  Per self inventory and discussions with writer, rates depression at a 2/10, hopelessness at a 0/10 and anxiety at a 6/10. Rates sleep as good, appetite as good, energy as low and concentration as good.  States goal for today is to "work on being consistent, everything I start today. I will try to finish it." Denies pain, physical complaints. R eye remains read with some clear drainage.   A: Medicated per orders, no prns requested or required. Level III obs in place for safety. Emotional support offered and self inventory reviewed. Encouraged completion of Suicide Safety Plan and programming participation. Discussed POC with MD, SW.  Fall prevention plan in place and reviewed with patient as pt is a high fall risk due to dizziness.   R: Patient verbalizes understanding of POC, falls prevention education.  Patient denies SI/HI/AVH and remains safe on level III obs. Will continue to monitor closely and make verbal contact frequently. Will prompt and encourage patient to attend groups.

## 2017-09-18 NOTE — Progress Notes (Signed)
The patient expressed in Wrap-Up group this evening that she had a good day overall since she felt "less down". The patient was unable to express her goal for tomorrow.

## 2017-09-18 NOTE — Progress Notes (Signed)
Pt has spent most of the shift in her room in bed, but she did come out and use the phone and get a snack.  She says the ointment is relieving the discomfort of the conjunctivitis, and she is just trying to stay away from other as much as possible to avoid spreading the infection.  She denies SI/HI/AVH at this time.  She voiced no issues or concerns this evening.  She feels the medication are working for her. Support and encouragement offered.  Discharge plans are in process.  Safety maintained with q15 minute checks.

## 2017-09-18 NOTE — Progress Notes (Signed)
[facility] MD Progress Note  09/18/2017 3:32 PM Sheri Hall  MRN:  [id number] Subjective:   47 y.o AAF single, lives with her family. Background history of Early life trauma, MDD, SUD and unresolved grief. Presented to the ER in company of her daughter. Patient was very tearful and distraught. She had been getting more depressed. She lost two of her sons within four years. She has not been coping well. She has been using cocaine and alcohol to cope. Routine labs significant for low calcium and albumin. Toxicology is negative. UDS is is positive for cocaine. BAL<5 mg/dl.  Chart reviewed today. Patient discussed at team today.  Staff reports that she slept relatively better last night. She complained of dizziness. No falls. No orthostatic changes. Of note she was started on Prazosin last night. Patient has been noted to be a bit withdrawn.   Seen today. Tells me that she had the most restful sleep in ages yesterday. Dizziness has resolved now. She is tolerating the other medications well. Says she feels marginally better today. Mood is improving. She stays to self mostly. Says she attended a few groups. Delusional mood is at baseline. No hallucination. No suicidal thoughts. No thoughts of violence. She gave me a copy of her local newspaper which covered her traumatic experience. Encouraged that things would get better gradually.   Principal Problem: MDD (major depressive disorder), severe (HCC) Diagnosis:   Patient Active Problem List   Diagnosis Date Noted  . PTSD (post-traumatic stress disorder) [F43.10] 09/17/2017  . Substance use disorder [F19.90] 09/17/2017  . Grief [F43.21] 09/17/2017  . MDD (major depressive disorder), severe (HCC) [F32.2] 09/16/2017   Total Time spent with patient: 20 minutes  Past Psychiatric History: As in H&P  Past Medical History:  Past Medical History:  Diagnosis Date  . Anxiety   . Arthritis    rheumatoid   . Asthma    seasonal  . Bipolar disorder (HCC)   .  Depression   . GERD (gastroesophageal reflux disease)   . OCD (obsessive compulsive disorder)   . PTSD (post-traumatic stress disorder)     Past Surgical History:  Procedure Laterality Date  . CESAREAN SECTION    . OPEN REDUCTION INTERNAL FIXATION (ORIF) DISTAL RADIAL FRACTURE Left 10/15/2015   Procedure: OPEN TREATMENT OF LEFT DISTAL RADIUS FRACTURE;  Surgeon: Mack Hook, MD;  Location: [facility];  Service: Orthopedics;  Laterality: Left;   Family History: History reviewed. No pertinent family history. Family Psychiatric  History: As in H&P Social History:  Social History   Substance and Sexual Activity  Alcohol Use Yes   Comment: daily     Social History   Substance and Sexual Activity  Drug Use Yes  . Types: Cocaine   Comment: wednesday March 22nd 2017    Social History   Socioeconomic History  . Marital status: Single    Spouse name: None  . Number of children: None  . Years of education: None  . Highest education level: None  Social Needs  . Financial resource strain: None  . Food insecurity - worry: None  . Food insecurity - inability: None  . Transportation needs - medical: None  . Transportation needs - non-medical: None  Occupational History  . None  Tobacco Use  . Smoking status: Current Every Day Smoker    Packs/day: 0.50    Types: Cigarettes  . Smokeless tobacco: Never Used  Substance and Sexual Activity  . Alcohol use: Yes    Comment: daily  .  Drug use: Yes    Types: Cocaine    Comment: wednesday March 22nd 2017  . Sexual activity: Yes    Birth control/protection: IUD    Comment: Mirena  Other Topics Concern  . None  Social History Narrative  . None   Additional Social History:                         Sleep: Better  Appetite:  Fair  Current Medications: Current Facility-Administered Medications  Medication Dose Route Frequency Provider Last Rate Last Dose  . acetaminophen (TYLENOL) tablet 650 mg  650 mg  Oral Q6H PRN Rankin, Shuvon B, NP      . alum & mag hydroxide-simeth (MAALOX/MYLANTA) 200-200-20 MG/5ML suspension 30 mL  30 mL Oral Q4H PRN Rankin, Shuvon B, NP      . ARIPiprazole (ABILIFY) tablet 5 mg  5 mg Oral Daily Hallee Mckenny, Delight Ovens, MD   5 mg at 09/18/17 0801  . erythromycin ophthalmic ointment   Right Eye Q6H Rankin, Shuvon B, NP      . hydrOXYzine (ATARAX/VISTARIL) tablet 25 mg  25 mg Oral Q6H PRN Rankin, Shuvon B, NP   25 mg at 09/17/17 1456  . magnesium hydroxide (MILK OF MAGNESIA) suspension 30 mL  30 mL Oral Daily PRN Rankin, Shuvon B, NP      . prazosin (MINIPRESS) capsule 2 mg  2 mg Oral QHS Liel Rudden, Delight Ovens, MD   2 mg at 09/17/17 2114  . traZODone (DESYREL) tablet 50 mg  50 mg Oral QHS PRN Rankin, Shuvon B, NP      . venlafaxine XR (EFFEXOR-XR) 24 hr capsule 75 mg  75 mg Oral Daily Gasper Hopes A, MD   75 mg at 09/18/17 0801    Lab Results: No results found for this or any previous visit (from the past 48 hour(s)).  Blood Alcohol level:  Lab Results  Component Value Date   ETH <10 09/15/2017    Metabolic Disorder Labs: No results found for: HGBA1C, MPG No results found for: PROLACTIN No results found for: CHOL, TRIG, HDL, CHOLHDL, VLDL, LDLCALC  Physical Findings: AIMS: Facial and Oral Movements Muscles of Facial Expression: None, normal Lips and Perioral Area: None, normal Jaw: None, normal Tongue: None, normal,Extremity Movements Upper (arms, wrists, hands, fingers): None, normal Lower (legs, knees, ankles, toes): None, normal, Trunk Movements Neck, shoulders, hips: None, normal, Overall Severity Severity of abnormal movements (highest score from questions above): None, normal Incapacitation due to abnormal movements: None, normal Patient's awareness of abnormal movements (rate only patient's report): No Awareness, Dental Status Current problems with teeth and/or dentures?: No Does patient usually wear dentures?: Yes(upper partial)  CIWA:    COWS:      Musculoskeletal: Strength & Muscle Tone: within normal limits Gait & Station: normal Patient leans: N/A  Psychiatric Specialty Exam: Physical Exam  Constitutional: She is oriented to person, place, and time. She appears well-developed and well-nourished.  HENT:  Head: Normocephalic and atraumatic.  Respiratory: Effort normal.  Neurological: She is alert and oriented to person, place, and time.  Psychiatric:  As above     ROS  Blood pressure 126/66, pulse 88, temperature 97.6 F (36.4 C), temperature source Oral, resp. rate 16, height 5' 2.5" (1.588 m), weight 61.7 kg (136 lb).Body mass index is 24.48 kg/m.  General Appearance: Calmer today. Good relatedness.   Eye Contact:  Better  Speech:  Decreased rate and tone  Volume:  Decreased  Mood:  Depressed  Affect:  Blunted and mood congruent  Thought Process:  Decreased speed of thought. Linear and goal directed.   Orientation:  Full (Time, Place, and Person)  Thought Content:  Paranoid Ideation and Rumination, delusional atmosphere. No preoccupation with violent thoughts. No negative ruminations. No obsession.  No hallucination in any modality.   Suicidal Thoughts:  No  Homicidal Thoughts:  No  Memory:  Immediate;   Good Recent;   Good Remote;   Good  Judgement:  Good  Insight:  Good  Psychomotor Activity:  Decreased  Concentration:  Concentration: Fair and Attention Span: Fair  Recall:  Good  Fund of Knowledge:  Good  Language:  Good  Akathisia:  Negative  Handed:    AIMS (if indicated):     Assets:  Desire for Improvement Financial Resources/Insurance Housing Physical Health Resilience  ADL's:  Intact  Cognition:  WNL  Sleep:  Number of Hours: 6.5     Treatment Plan Summary: Patient is tolerating recent medication adjustment relatively well. Mood is marginally better. Venlafaxine was titrated today. We would evaluate her further.   Psychiatric: MDD Recurrent PTSD SUD Pathological  grief  Medical: GERD  Psychosocial:  Bereavement  Family dynamics   PLAN: 1. Continue current regimen 2. Continue to monitor mood, behavior and interaction with peers 3. Continue to encourage unit groups and therapeutic activity  Georgiann Cocker, MD 09/18/2017, 3:32 PM

## 2017-09-18 NOTE — [facility] Group Notes (Signed)
LCSW Group Therapy Note 09/18/2017 12:23 PM  Type of Therapy and Topic: Group Therapy: Avoiding Self-Sabotaging and Enabling Behaviors  Participation Level: Did Not Attend  Description of Group:  In this group, patients will learn how to identify obstacles, self-sabotaging and enabling behaviors, as well as: what are they, why do we do them and what needs these behaviors meet. Discuss unhealthy relationships and how to have positive healthy boundaries with those that sabotage and enable. Explore aspects of self-sabotage and enabling in yourself and how to limit these self-destructive behaviors in everyday life.  Therapeutic Goals: 1. Patient will identify one obstacle that relates to self-sabotage and enabling behaviors 2. Patient will identify one personal self-sabotaging or enabling behavior they did prior to admission 3. Patient will state a plan to change the above identified behavior 4. Patient will demonstrate ability to communicate their needs through discussion and/or role play.   Summary of Patient Progress:  Invited, chose not to attend.    Therapeutic Modalities:  Cognitive Behavioral Therapy Person-Centered Therapy Motivational Interviewing   Baldo Daub LCSWA Clinical Social Worker

## 2017-09-19 DIAGNOSIS — G4700 Insomnia, unspecified: Secondary | ICD-10-CM

## 2017-09-19 DIAGNOSIS — F1990 Other psychoactive substance use, unspecified, uncomplicated: Secondary | ICD-10-CM

## 2017-09-19 DIAGNOSIS — F4321 Adjustment disorder with depressed mood: Secondary | ICD-10-CM

## 2017-09-19 DIAGNOSIS — Z975 Presence of (intrauterine) contraceptive device: Secondary | ICD-10-CM

## 2017-09-19 MED ORDER — TRAZODONE HCL 50 MG PO TABS
50.0000 mg | ORAL_TABLET | Freq: Every evening | ORAL | Status: DC | PRN
  Filled 2017-09-19 (×5): qty 1

## 2017-09-19 NOTE — Plan of Care (Signed)
Patient has attended all unit activities this shift. Patient has napped some in her room this shift, but is significantly less than it has been. Patient is able to name her medications, their schedules and indications. Patient is taking medications as prescribed.

## 2017-09-19 NOTE — Progress Notes (Incomplete)
Data. Patient denies SI/HI/AVH. Verbally contracts for safety on the unit and to come to staff before acting of any self harm thoughts.  Patient interacting well with staff and other patients. Affect is flat, but will brighten with interaction. She reports that she is having not more detox symptoms at this time. Patient has been spending time in her room, between groups, but has been attending all groups. Her eye is looking less red and irritated. Patient continues to use to ABT oint. On her self assessment patient reports 2/10 for depression, 0/10 for hopelessness and 4/10 for anxiety. Her goal for today is, "Stay focused." Action. Emotional support and encouragement offered. Education provided on medication, indications and side effect. Q 15 minute checks done for safety. Response. Safety on the unit maintained through 15 minute checks.  Medications taken as prescribed. Remained calm and appropriate through out shift.

## 2017-09-19 NOTE — [facility] Group Notes (Signed)
LCSW Group Therapy Note  09/19/2017 10:00AM - 11:15 - [address], 11:15-12:00 - [address]  Type of Therapy and Topic:  Group Therapy: Anger Cues and Responses  Participation Level:  Minimal   Description of Group:   In this group, patients learned how to recognize the physical, cognitive, emotional, and behavioral responses they have to anger-provoking situations.  They identified a recent time they became angry and how they reacted.  They analyzed how their reaction was possibly beneficial and how it was possibly unhelpful.  The group discussed a variety of healthier coping skills that could help with such a situation in the future.  Deep breathing was practiced briefly.  Therapeutic Goals: 1. Patients will remember their last incident of anger and how they felt emotionally and physically, what their thoughts were at the time, and how they behaved. 2. Patients will identify how their behavior at that time worked for them, as well as how it worked against them. 3. Patients will explore possible new behaviors to use in future anger situations. 4. Patients will learn that anger itself is normal and cannot be eliminated, and that healthier reactions can assist with resolving conflict rather than worsening situations.  Summary of Patient Progress:  The patient shared that their most recent time of anger was yesterday and this was because she felt minimized by a staff person.  Just talking about the situation made her angry once again.  She demanded to know if CSW was trying to say that if a person just thinks correctly they can control having any panic attacks, and when CSW reiterated that we were specifically talking about anger today, she became more agitated.  CSW apologized for any misunderstanding, but she remained agitated and said she wanted to leave.  CSW told her that was alright, and she left, but first she became threatening to a person sitting next to her, threatening to harm that  other patient if she said anything else to her.  Therapeutic Modalities:   Cognitive Behavioral Therapy  Lynnell Chad  09/19/2017 8:11 AM

## 2017-09-19 NOTE — Progress Notes (Signed)
[facility] MD Progress Note  09/19/2017 2:22 PM Nena Maxson  MRN:  [id number]   Subjective:  Belissa reports " I am feeling and doing much better today, my rest is peaceful however I still wake up around 2 am"  Objective: Laiken Molesky is awake, alert and oriented. Seen resting in bed. Reports her mood has improved since starting on medications. States she has plans to attend a intensive outpatient program after discharge.  Denies suicidal or homicidal ideation. Denies auditory or visual hallucination and does not appear to be responding to internal stimuli. . Patient reports she is medication compliant without mediation side effects and had a recent change with the Abilify. Discussed adjusting Efferox-XR on 3/3. Support, encouragement and reassurance was provided.     Principal Problem: MDD (major depressive disorder), severe (HCC) Diagnosis:   Patient Active Problem List   Diagnosis Date Noted  . PTSD (post-traumatic stress disorder) [F43.10] 09/17/2017  . Substance use disorder [F19.90] 09/17/2017  . Grief [F43.21] 09/17/2017  . MDD (major depressive disorder), severe (HCC) [F32.2] 09/16/2017   Total Time spent with patient: 20 minutes  Past Psychiatric History: As in H&P  Past Medical History:  Past Medical History:  Diagnosis Date  . Anxiety   . Arthritis    rheumatoid   . Asthma    seasonal  . Bipolar disorder (HCC)   . Depression   . GERD (gastroesophageal reflux disease)   . OCD (obsessive compulsive disorder)   . PTSD (post-traumatic stress disorder)     Past Surgical History:  Procedure Laterality Date  . CESAREAN SECTION    . OPEN REDUCTION INTERNAL FIXATION (ORIF) DISTAL RADIAL FRACTURE Left 10/15/2015   Procedure: OPEN TREATMENT OF LEFT DISTAL RADIUS FRACTURE;  Surgeon: Mack Hook, MD;  Location: [facility];  Service: Orthopedics;  Laterality: Left;   Family History: History reviewed. No pertinent family history. Family Psychiatric  History: As  in H&P Social History:  Social History   Substance and Sexual Activity  Alcohol Use Yes   Comment: daily     Social History   Substance and Sexual Activity  Drug Use Yes  . Types: Cocaine   Comment: wednesday March 22nd 2017    Social History   Socioeconomic History  . Marital status: Single    Spouse name: None  . Number of children: None  . Years of education: None  . Highest education level: None  Social Needs  . Financial resource strain: None  . Food insecurity - worry: None  . Food insecurity - inability: None  . Transportation needs - medical: None  . Transportation needs - non-medical: None  Occupational History  . None  Tobacco Use  . Smoking status: Current Every Day Smoker    Packs/day: 0.50    Types: Cigarettes  . Smokeless tobacco: Never Used  Substance and Sexual Activity  . Alcohol use: Yes    Comment: daily  . Drug use: Yes    Types: Cocaine    Comment: wednesday March 22nd 2017  . Sexual activity: Yes    Birth control/protection: IUD    Comment: Mirena  Other Topics Concern  . None  Social History Narrative  . None   Additional Social History:                         Sleep: Better  Appetite:  Fair  Current Medications: Current Facility-Administered Medications  Medication Dose Route Frequency Provider Last Rate Last Dose  .  acetaminophen (TYLENOL) tablet 650 mg  650 mg Oral Q6H PRN Rankin, Shuvon B, NP      . alum & mag hydroxide-simeth (MAALOX/MYLANTA) 200-200-20 MG/5ML suspension 30 mL  30 mL Oral Q4H PRN Rankin, Shuvon B, NP      . ARIPiprazole (ABILIFY) tablet 5 mg  5 mg Oral Daily Izediuno, Delight Ovens, MD   5 mg at 09/19/17 0805  . erythromycin ophthalmic ointment   Right Eye Q6H Rankin, Shuvon B, NP      . hydrOXYzine (ATARAX/VISTARIL) tablet 25 mg  25 mg Oral Q6H PRN Rankin, Shuvon B, NP   25 mg at 09/19/17 1421  . magnesium hydroxide (MILK OF MAGNESIA) suspension 30 mL  30 mL Oral Daily PRN Rankin, Shuvon B, NP      .  prazosin (MINIPRESS) capsule 2 mg  2 mg Oral QHS Izediuno, Delight Ovens, MD   2 mg at 09/18/17 2125  . traZODone (DESYREL) tablet 50 mg  50 mg Oral QHS PRN Rankin, Shuvon B, NP      . venlafaxine XR (EFFEXOR-XR) 24 hr capsule 75 mg  75 mg Oral Daily Izediuno, Vincent A, MD   75 mg at 09/19/17 0805    Lab Results: No results found for this or any previous visit (from the past 48 hour(s)).  Blood Alcohol level:  Lab Results  Component Value Date   ETH <10 09/15/2017    Metabolic Disorder Labs: No results found for: HGBA1C, MPG No results found for: PROLACTIN No results found for: CHOL, TRIG, HDL, CHOLHDL, VLDL, LDLCALC  Physical Findings: AIMS: Facial and Oral Movements Muscles of Facial Expression: None, normal Lips and Perioral Area: None, normal Jaw: None, normal Tongue: None, normal,Extremity Movements Upper (arms, wrists, hands, fingers): None, normal Lower (legs, knees, ankles, toes): None, normal, Trunk Movements Neck, shoulders, hips: None, normal, Overall Severity Severity of abnormal movements (highest score from questions above): None, normal Incapacitation due to abnormal movements: None, normal Patient's awareness of abnormal movements (rate only patient's report): No Awareness, Dental Status Current problems with teeth and/or dentures?: No Does patient usually wear dentures?: Yes(upper partial)  CIWA:    COWS:     Musculoskeletal: Strength & Muscle Tone: within normal limits Gait & Station: normal Patient leans: N/A  Psychiatric Specialty Exam: Physical Exam  Constitutional: She is oriented to person, place, and time. She appears well-developed and well-nourished.  HENT:  Head: Normocephalic and atraumatic.  Respiratory: Effort normal.  Neurological: She is alert and oriented to person, place, and time.  Psychiatric:  As above     ROS  Blood pressure 112/74, pulse 81, temperature 98.5 F (36.9 C), temperature source Oral, resp. rate 18, height 5' 2.5"  (1.588 m), weight 61.7 kg (136 lb).Body mass index is 24.48 kg/m.  General Appearance: casual, pleasant and smiling   Eye Contact:  Better  Speech: clear and coherent    Volume:  Decreased  Mood:  Depressed  Affect:  Blunted and mood congruent- improving   Thought Process:  Decreased speed of thought. Linear and goal directed.   Orientation:  Full (Time, Place, and Person)  Thought Content:  Paranoid Ideation and Rumination, delusional atmosphere. No preoccupation with violent thoughts. No negative ruminations. No obsession.  No hallucination in any modality.   Suicidal Thoughts:  No  Homicidal Thoughts:  No  Memory:  Immediate;   Good Recent;   Good Remote;   Good  Judgement:  Good  Insight:  Good  Psychomotor Activity:  Decreased  Concentration:  Concentration: Fair and Attention Span: Fair  Recall:  Good  Fund of Knowledge:  Good  Language:  Good  Akathisia:  Negative  Handed:    AIMS (if indicated):     Assets:  Desire for Improvement Financial Resources/Insurance Housing Physical Health Resilience  ADL's:  Intact  Cognition:  WNL  Sleep:  Number of Hours: 6.75    Treatment Plan Summary: Daily contact with patient to assess and evaluate symptoms and progress in treatment and Medication management  Continue with current treatment plan on 09/19/2017 except where noted  Continue Effexor 75 mg  for mood stabilization. ( with titration on 3/3) Continue Abilify 5 mg PO QD    Continue with Trazodone 50 mg for insomnia  Will continue to monitor vitals ,medication compliance and treatment side effects while patient is here.   CSW will continue  working on disposition.  Patient to participate in therapeutic milieu   Psychiatric: MDD Recurrent PTSD SUD Pathological grief  Medical: GERD  Psychosocial:  Bereavement  Family dynamics   PLAN: 1. Continue current regimen 2. Continue to monitor mood, behavior and interaction with peers 3. Continue to  encourage unit groups and therapeutic activity  Oneta Rack, NP 09/19/2017, 2:22 PM

## 2017-09-19 NOTE — [facility] Group Notes (Signed)
[facility] Group Notes:  (Nursing/MHT/Case Management/Adjunct)  Date:  09/19/2017  Time:  3:30 PM  Type of Therapy:  Nurse Education  Participation Level:  Active  Participation Quality:  Appropriate  Affect:  Appropriate  Cognitive:  Appropriate  Insight:  Appropriate  Engagement in Group:  Engaged  Modes of Intervention:  Activity  Summary of Progress/Problems:   Nurse introduced a game, called the "Ungame",  used as a form to allowing the patient s a forum to discuss feelings, talk about values and relate experiences, that are not too in depth.  Sheri Hall 09/19/2017, 3:30 PM

## 2017-09-19 NOTE — Progress Notes (Signed)
Pt reports that she is doing ok this evening.  She denies SI/HI/AVH.  She feels the meds are helping her and states that the prazosin is working to help her sleep better.  She was initially in bed at the beginning of the shift, but came out to attend group, then eat and snack and watch TV.  She voiced no needs or concerns this evening.  Support and encouragement offered.  Discharge plans are in process.  Safety maintained with q15 minute checks.

## 2017-09-20 MED ORDER — TRAZODONE HCL 50 MG PO TABS
50.0000 mg | ORAL_TABLET | Freq: Every day | ORAL | Status: DC
  Administered 2017-09-20 – 2017-09-21 (×2): 50 mg via ORAL
  Filled 2017-09-20 (×2): qty 1

## 2017-09-20 MED ORDER — HYDROXYZINE HCL 50 MG PO TABS
50.0000 mg | ORAL_TABLET | Freq: Four times a day (QID) | ORAL | Status: DC | PRN
  Administered 2017-09-20: 50 mg via ORAL
  Filled 2017-09-20: qty 1

## 2017-09-20 MED ORDER — GUAIFENESIN ER 600 MG PO TB12
600.0000 mg | ORAL_TABLET | Freq: Two times a day (BID) | ORAL | Status: DC
  Administered 2017-09-20 – 2017-09-21 (×3): 600 mg via ORAL
  Filled 2017-09-20 (×6): qty 1

## 2017-09-20 MED ORDER — ARIPIPRAZOLE 10 MG PO TABS
10.0000 mg | ORAL_TABLET | Freq: Every day | ORAL | Status: DC
  Administered 2017-09-21: 10 mg via ORAL
  Filled 2017-09-20 (×3): qty 1

## 2017-09-20 NOTE — [facility] Suicide Risk Assessment (Signed)
[facility] INPATIENT:  Family/Significant Other Suicide Prevention Education  Suicide Prevention Education:  Contact Attempts: [name], mother, [phone] has been identified by the patient as the family member/significant other with whom the patient will be residing, and identified as the person(s) who will aid the patient in the event of a mental health crisis.  With written consent from the patient, two attempts were made to provide suicide prevention education, prior to and/or following the patient's discharge.  We were unsuccessful in providing suicide prevention education.  A suicide education pamphlet was given to the patient to share with family/significant other.  Date and time of first attempt:  09/18/17 / done by weekday CSW, noted on handoff Date and time of second attempt:09/20/2017   /  4:00 PM   Lynnell Chad 09/20/2017, 4:00 PM

## 2017-09-20 NOTE — [facility] Group Notes (Signed)
Pt was invited but did not attend orientation/goals group. 

## 2017-09-20 NOTE — Progress Notes (Signed)
D.  Pt pleasant on approach, no complaints voiced.  Pt is anticipating discharge tomorrow and stated it must be by 1200 because her only available ride is coming at 1230 and can not wait for her.  Pt did not attend evening AA group, remained in room.  Pt observed engaged in appropriate interaction with peers on unit.  Pt denies SI/HI/AVH at this time.  A.  Support and encouragement offered,medication given as ordered  R.  Pt remains safe on the unit, will continue to monitor.

## 2017-09-20 NOTE — Progress Notes (Signed)
Patient did not attend the evening speaker AA meeting. Pt was notified that group was beginning but returned to her room.   

## 2017-09-20 NOTE — [facility] Group Notes (Signed)
[facility] LCSW Group Therapy Note  Date/Time:  09/20/2017 10:00-11:00AM  Type of Therapy and Topic:  Group Therapy:  Healthy and Unhealthy Supports  Participation Level:  Did Not Attend   Description of Group:  Patients in this group were introduced to the idea of adding a variety of healthy supports to address the various needs in their lives.Patients discussed what additional healthy supports could be helpful in their recovery and wellness after discharge in order to prevent future hospitalizations.   An emphasis was placed on using counselor, doctor, therapy groups, 12-step groups, and problem-specific support groups to expand supports.  They also worked as a group on developing a specific plan for several patients to deal with unhealthy supports through boundary-setting, psychoeducation with loved ones, and even termination of relationships.   Therapeutic Goals:   1)  discuss importance of adding supports to stay well once out of the hospital  2)  compare healthy versus unhealthy supports and identify some examples of each  3)  generate ideas and descriptions of healthy supports that can be added  4)  offer mutual support about how to address unhealthy supports  5)  encourage active participation in and adherence to discharge plan    Summary of Patient Progress:  N/A   Therapeutic Modalities:   Motivational Interviewing Brief Solution-Focused Therapy  Ambrose Mantle, LCSW

## 2017-09-20 NOTE — Progress Notes (Signed)
[facility] MD Progress Note  09/20/2017 8:56 AM Sheri Hall  MRN:  [id number]   Subjective:  [name] reports " I am feeling tired and I didn't take any nighttime medications so I have been up and down all night"   Objective: Sheri Hall Patient reports feeling a little tired and anxious. Reports experiencing  some chest palpation and states it takes a while to clam down with medications. Reports some delusions however wouldn't elaborate with thoughts. patient was dismissive when asked.  Patient isolates to her room most of the day.  States she has plans to attend a intensive outpatient program and is hopeful  to discharge soon.     Denies suicidal or homicidal ideation. Denies auditory or visual hallucination and does not appear to be responding to internal stimuli. Patient reports she is medication compliant without mediation side effects and had a recent change with the Abilify 5mg  to 10 mg . Support, encouragement and reassurance was provided.     Principal Problem: MDD (major depressive disorder), severe (HCC) Diagnosis:   Patient Active Problem List   Diagnosis Date Noted  . PTSD (post-traumatic stress disorder) [F43.10] 09/17/2017  . Substance use disorder [F19.90] 09/17/2017  . Grief [F43.21] 09/17/2017  . MDD (major depressive disorder), severe (HCC) [F32.2] 09/16/2017   Total Time spent with patient: 20 minutes  Past Psychiatric History: As in H&P  Past Medical History:  Past Medical History:  Diagnosis Date  . Anxiety   . Arthritis    rheumatoid   . Asthma    seasonal  . Bipolar disorder (HCC)   . Depression   . GERD (gastroesophageal reflux disease)   . OCD (obsessive compulsive disorder)   . PTSD (post-traumatic stress disorder)     Past Surgical History:  Procedure Laterality Date  . CESAREAN SECTION    . OPEN REDUCTION INTERNAL FIXATION (ORIF) DISTAL RADIAL FRACTURE Left 10/15/2015   Procedure: OPEN TREATMENT OF LEFT DISTAL RADIUS FRACTURE;  Surgeon: 10/17/2015, MD;  Location: [facility];  Service: Orthopedics;  Laterality: Left;   Family History: History reviewed. No pertinent family history. Family Psychiatric  History: As in H&P Social History:  Social History   Substance and Sexual Activity  Alcohol Use Yes   Comment: daily     Social History   Substance and Sexual Activity  Drug Use Yes  . Types: Cocaine   Comment: wednesday March 22nd 2017    Social History   Socioeconomic History  . Marital status: Single    Spouse name: None  . Number of children: None  . Years of education: None  . Highest education level: None  Social Needs  . Financial resource strain: None  . Food insecurity - worry: None  . Food insecurity - inability: None  . Transportation needs - medical: None  . Transportation needs - non-medical: None  Occupational History  . None  Tobacco Use  . Smoking status: Current Every Day Smoker    Packs/day: 0.50    Types: Cigarettes  . Smokeless tobacco: Never Used  Substance and Sexual Activity  . Alcohol use: Yes    Comment: daily  . Drug use: Yes    Types: Cocaine    Comment: wednesday March 22nd 2017  . Sexual activity: Yes    Birth control/protection: IUD    Comment: Mirena  Other Topics Concern  . None  Social History Narrative  . None   Additional Social History:  Sleep: Better  Appetite:  Fair  Current Medications: Current Facility-Administered Medications  Medication Dose Route Frequency Provider Last Rate Last Dose  . acetaminophen (TYLENOL) tablet 650 mg  650 mg Oral Q6H PRN Rankin, Shuvon B, NP      . alum & mag hydroxide-simeth (MAALOX/MYLANTA) 200-200-20 MG/5ML suspension 30 mL  30 mL Oral Q4H PRN Rankin, Shuvon B, NP      . ARIPiprazole (ABILIFY) tablet 5 mg  5 mg Oral Daily Izediuno, Delight Ovens, MD   5 mg at 09/19/17 0805  . erythromycin ophthalmic ointment   Right Eye Q6H Rankin, Shuvon B, NP      . hydrOXYzine  (ATARAX/VISTARIL) tablet 25 mg  25 mg Oral Q6H PRN Rankin, Shuvon B, NP   25 mg at 09/19/17 1421  . magnesium hydroxide (MILK OF MAGNESIA) suspension 30 mL  30 mL Oral Daily PRN Rankin, Shuvon B, NP      . prazosin (MINIPRESS) capsule 2 mg  2 mg Oral QHS Izediuno, Delight Ovens, MD   2 mg at 09/18/17 2125  . traZODone (DESYREL) tablet 50 mg  50 mg Oral QHS,MR X 1 Lewis, Tanika N, NP      . venlafaxine XR (EFFEXOR-XR) 24 hr capsule 75 mg  75 mg Oral Daily Izediuno, Delight Ovens, MD   75 mg at 09/19/17 0805    Lab Results: No results found for this or any previous visit (from the past 48 hour(s)).  Blood Alcohol level:  Lab Results  Component Value Date   ETH <10 09/15/2017    Metabolic Disorder Labs: No results found for: HGBA1C, MPG No results found for: PROLACTIN No results found for: CHOL, TRIG, HDL, CHOLHDL, VLDL, LDLCALC  Physical Findings: AIMS: Facial and Oral Movements Muscles of Facial Expression: None, normal Lips and Perioral Area: None, normal Jaw: None, normal Tongue: None, normal,Extremity Movements Upper (arms, wrists, hands, fingers): None, normal Lower (legs, knees, ankles, toes): None, normal, Trunk Movements Neck, shoulders, hips: None, normal, Overall Severity Severity of abnormal movements (highest score from questions above): None, normal Incapacitation due to abnormal movements: None, normal Patient's awareness of abnormal movements (rate only patient's report): No Awareness, Dental Status Current problems with teeth and/or dentures?: No Does patient usually wear dentures?: Yes(upper partial)  CIWA:    COWS:     Musculoskeletal: Strength & Muscle Tone: within normal limits Gait & Station: normal Patient leans: N/A  Psychiatric Specialty Exam: Physical Exam  Constitutional: She is oriented to person, place, and time. She appears well-developed and well-nourished.  HENT:  Head: Normocephalic and atraumatic.  Respiratory: Effort normal.  Neurological: She  is alert and oriented to person, place, and time.  Psychiatric:  As above     Review of Systems  Psychiatric/Behavioral: Positive for depression and hallucinations. The patient is nervous/anxious.     Blood pressure 111/83, pulse 90, temperature 98.3 F (36.8 C), temperature source Oral, resp. rate 16, height 5' 2.5" (1.588 m), weight 61.7 kg (136 lb).Body mass index is 24.48 kg/m.  General Appearance: casual, pleasant and smiling   Eye Contact:  Better  Speech: clear and coherent    Volume:  Decreased  Mood:  Depressed  Affect:  Blunted and mood congruent- improving   Thought Process:  Decreased speed of thought. Linear and goal directed.   Orientation:  Full (Time, Place, and Person)  Thought Content:  Paranoid Ideation and Rumination, contiune to report delusional atmosphere.   Suicidal Thoughts:  No  Homicidal Thoughts:  No  Memory:  Immediate;   Good Recent;   Good Remote;   Good  Judgement:  Good  Insight:  Good  Psychomotor Activity:  Decreased  Concentration:  Concentration: Fair and Attention Span: Fair  Recall:  Good  Fund of Knowledge:  Good  Language:  Good  Akathisia:  Negative  Handed:    AIMS (if indicated):     Assets:  Desire for Improvement Financial Resources/Insurance Housing Physical Health Resilience  ADL's:  Intact  Cognition:  WNL  Sleep:  Number of Hours: 5.75    Treatment Plan Summary: Daily contact with patient to assess and evaluate symptoms and progress in treatment and Medication management  Continue with current treatment plan on 09/20/2017 except where noted  Continue Effexor 75 mg  for mood stabilization.  Increased Abilify 5 mg to 10 mg  PO QD    Continue with Trazodone 50 mg for insomnia  Will continue to monitor vitals ,medication compliance and treatment side effects while patient is here.   CSW will continue  working on disposition.  Patient to participate in therapeutic milieu   Psychiatric: MDD  Recurrent PTSD SUD Pathological grief  Medical: GERD  Psychosocial:  Bereavement  Family dynamics   PLAN: 1. Continue current regimen 2. Continue to monitor mood, behavior and interaction with peers 3. Continue to encourage unit groups and therapeutic activity  Oneta Rack, NP 09/20/2017, 8:56 AM

## 2017-09-20 NOTE — Progress Notes (Signed)
D. Pt presents with an anxious affect and mood. Pt somewhat isolative to her room this am- did not attend orientation group or group led by Child psychotherapist.  Pt currently denies SI/HI and AVH  A. Labs and vitals monitored. Pt compliant with medications. Pt supported emotionally and encouraged to express concerns and ask questions.   R. Pt remains safe with 15 minute checks. Will continue POC.

## 2017-09-21 MED ORDER — TRAZODONE HCL 50 MG PO TABS: 50.0000 mg | ORAL_TABLET | Freq: Every evening | ORAL | 0 refills | Status: DC | PRN

## 2017-09-21 MED ORDER — VENLAFAXINE HCL ER 75 MG PO CP24: 75.0000 mg | ORAL_CAPSULE | Freq: Every day | ORAL | 0 refills | Status: DC

## 2017-09-21 MED ORDER — PRAZOSIN HCL 2 MG PO CAPS: 2.0000 mg | ORAL_CAPSULE | Freq: Every day | ORAL | 0 refills | Status: DC

## 2017-09-21 MED ORDER — ARIPIPRAZOLE 10 MG PO TABS: 10.0000 mg | ORAL_TABLET | Freq: Every day | ORAL | 0 refills | Status: DC

## 2017-09-21 MED ORDER — HYDROXYZINE HCL 50 MG PO TABS: 50.0000 mg | ORAL_TABLET | Freq: Four times a day (QID) | ORAL | 0 refills | Status: DC | PRN

## 2017-09-21 NOTE — Progress Notes (Signed)
  [facility] Adult Case Management Discharge Plan :  Will you be returning to the same living situation after discharge:  Yes,  home At discharge, do you have transportation home?: Yes,  friend Do you have the ability to pay for your medications: Yes,  [facility]  Release of information consent forms completed and submitted to medical records by CSW.  Patient to Follow up at: Follow-up Information    [facility] Follow up on 09/23/2017.   Why:  Please walk-in within 3 days of hospital discharge for Substance Abusive Intensive Outpatient Program/Medication Management assessment. Walk in times: Monday, Wednesday, Fridays between 11:00AM-12:30PM. Thank you.  Contact information: [address] [place], [place] [address] Phone: [phone] Fax: [phone]        Patient also provided with Hospice grief counseling information.   Next level of care provider has access to [facility]:no  Safety Planning and Suicide Prevention discussed: Yes,  SPE completed with pt; contact attempts made with pt's mother. [facility] pamphlet and Mobile Crisis information provided.  Have you used any form of tobacco in the last 30 days? (Cigarettes, Smokeless Tobacco, Cigars, and/or Pipes): Yes  Has patient been referred to the Quitline?: Patient refused referral  Patient has been referred for addiction treatment: Yes  [employer], LCSW 09/21/2017, 9:04 AM

## 2017-09-21 NOTE — Progress Notes (Signed)
Recreation Therapy Notes  Date: 09/21/17 Time: 0930 Location: [address] Dayroom  Group Topic: Stress Management  Goal Area(s) Addresses:  Patient will verbalize importance of using healthy stress management.  Patient will identify positive emotions associated with healthy stress management.   Intervention: Stress Management  Activity :  Guided Imagery.  LRT introduced the stress management technique of guided imagery.  LRT read a script that allowed patients to envision being on the beach.  Patients were to follow along as the script was read to engage in the activity.  Education:  Stress Management, Discharge Planning.   Education Outcome: Acknowledges edcuation/In group clarification offered/Needs additional education  Clinical Observations/Feedback: Pt did not attend group.     Caroll Rancher, LRT/CTRS         Lillia Abed, Rayona Sardinha A 09/21/2017 11:03 AM
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
## 2017-09-21 NOTE — Progress Notes (Signed)
Pt discharged to lobby waiting for her daughter to pick her up. Pt was ambulatory,stable and appreciative at that time. All papers and prescriptions were given and valuables returned. Verbal understanding expressed. Denies SI/HI and A/VH. Pt given opportunity to express concerns and ask questions.
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
## 2018-07-15 ENCOUNTER — Encounter (HOSPITAL_COMMUNITY): Payer: Self-pay

## 2018-07-15 ENCOUNTER — Emergency Department (HOSPITAL_COMMUNITY): Payer: Medicaid Other

## 2018-07-15 DIAGNOSIS — F17210 Nicotine dependence, cigarettes, uncomplicated: Secondary | ICD-10-CM | POA: Diagnosis present

## 2018-07-15 DIAGNOSIS — D649 Anemia, unspecified: Secondary | ICD-10-CM | POA: Diagnosis present

## 2018-07-15 DIAGNOSIS — F319 Bipolar disorder, unspecified: Secondary | ICD-10-CM | POA: Diagnosis present

## 2018-07-15 DIAGNOSIS — F429 Obsessive-compulsive disorder, unspecified: Secondary | ICD-10-CM | POA: Diagnosis present

## 2018-07-15 DIAGNOSIS — F4310 Post-traumatic stress disorder, unspecified: Secondary | ICD-10-CM | POA: Diagnosis present

## 2018-07-15 DIAGNOSIS — J181 Lobar pneumonia, unspecified organism: Secondary | ICD-10-CM | POA: Diagnosis present

## 2018-07-15 DIAGNOSIS — K219 Gastro-esophageal reflux disease without esophagitis: Secondary | ICD-10-CM | POA: Diagnosis present

## 2018-07-15 DIAGNOSIS — F1410 Cocaine abuse, uncomplicated: Secondary | ICD-10-CM | POA: Diagnosis present

## 2018-07-15 DIAGNOSIS — A419 Sepsis, unspecified organism: Principal | ICD-10-CM | POA: Diagnosis present

## 2018-07-15 DIAGNOSIS — J45909 Unspecified asthma, uncomplicated: Secondary | ICD-10-CM | POA: Diagnosis present

## 2018-07-15 DIAGNOSIS — M069 Rheumatoid arthritis, unspecified: Secondary | ICD-10-CM | POA: Diagnosis present

## 2018-07-15 NOTE — ED Triage Notes (Signed)
Pt complains of upper back pain and a productive cough since yesterday and it's worse today

## 2018-07-16 ENCOUNTER — Inpatient Hospital Stay (HOSPITAL_COMMUNITY)
Admission: EM | Admit: 2018-07-16 | Discharge: 2018-07-20 | DRG: 871 | Disposition: A | Discharge status: Home / Self Care | Payer: Medicaid Other | Attending: Family Medicine | Admitting: Family Medicine

## 2018-07-16 ENCOUNTER — Encounter (HOSPITAL_COMMUNITY): Payer: Self-pay | Admitting: Internal Medicine

## 2018-07-16 ENCOUNTER — Other Ambulatory Visit: Payer: Self-pay

## 2018-07-16 DIAGNOSIS — T17800A Unspecified foreign body in other parts of respiratory tract causing asphyxiation, initial encounter: Secondary | ICD-10-CM

## 2018-07-16 DIAGNOSIS — I959 Hypotension, unspecified: Secondary | ICD-10-CM | POA: Diagnosis not present

## 2018-07-16 DIAGNOSIS — M546 Pain in thoracic spine: Secondary | ICD-10-CM | POA: Diagnosis present

## 2018-07-16 DIAGNOSIS — F319 Bipolar disorder, unspecified: Secondary | ICD-10-CM | POA: Diagnosis present

## 2018-07-16 DIAGNOSIS — F429 Obsessive-compulsive disorder, unspecified: Secondary | ICD-10-CM | POA: Diagnosis present

## 2018-07-16 DIAGNOSIS — F17210 Nicotine dependence, cigarettes, uncomplicated: Secondary | ICD-10-CM | POA: Diagnosis present

## 2018-07-16 DIAGNOSIS — A419 Sepsis, unspecified organism: Secondary | ICD-10-CM | POA: Diagnosis present

## 2018-07-16 DIAGNOSIS — D649 Anemia, unspecified: Secondary | ICD-10-CM

## 2018-07-16 DIAGNOSIS — M069 Rheumatoid arthritis, unspecified: Secondary | ICD-10-CM

## 2018-07-16 DIAGNOSIS — F1410 Cocaine abuse, uncomplicated: Secondary | ICD-10-CM | POA: Diagnosis present

## 2018-07-16 DIAGNOSIS — F1490 Cocaine use, unspecified, uncomplicated: Secondary | ICD-10-CM | POA: Diagnosis not present

## 2018-07-16 DIAGNOSIS — K219 Gastro-esophageal reflux disease without esophagitis: Secondary | ICD-10-CM | POA: Diagnosis present

## 2018-07-16 DIAGNOSIS — J181 Lobar pneumonia, unspecified organism: Secondary | ICD-10-CM | POA: Diagnosis present

## 2018-07-16 DIAGNOSIS — F4310 Post-traumatic stress disorder, unspecified: Secondary | ICD-10-CM | POA: Diagnosis present

## 2018-07-16 DIAGNOSIS — J45909 Unspecified asthma, uncomplicated: Secondary | ICD-10-CM | POA: Diagnosis present

## 2018-07-16 DIAGNOSIS — J189 Pneumonia, unspecified organism: Secondary | ICD-10-CM | POA: Diagnosis not present

## 2018-07-16 DIAGNOSIS — R6520 Severe sepsis without septic shock: Secondary | ICD-10-CM | POA: Diagnosis not present

## 2018-07-16 LAB — EXPECTORATED SPUTUM ASSESSMENT W GRAM STAIN, RFLX TO RESP C

## 2018-07-16 LAB — RAPID URINE DRUG SCREEN, HOSP PERFORMED
AMPHETAMINES: NOT DETECTED
Barbiturates: NOT DETECTED
Benzodiazepines: NOT DETECTED
Cocaine: NOT DETECTED
OPIATES: NOT DETECTED
Tetrahydrocannabinol: NOT DETECTED

## 2018-07-16 LAB — URINALYSIS, ROUTINE W REFLEX MICROSCOPIC
BILIRUBIN URINE: NEGATIVE
Glucose, UA: NEGATIVE mg/dL
Hgb urine dipstick: NEGATIVE
Ketones, ur: NEGATIVE mg/dL
Leukocytes, UA: NEGATIVE
Nitrite: NEGATIVE
PH: 6 (ref 5.0–8.0)
Protein, ur: NEGATIVE mg/dL
SPECIFIC GRAVITY, URINE: 1.008 (ref 1.005–1.030)

## 2018-07-16 LAB — EXPECTORATED SPUTUM ASSESSMENT W REFEX TO RESP CULTURE

## 2018-07-16 LAB — STREP PNEUMONIAE URINARY ANTIGEN: Strep Pneumo Urinary Antigen: NEGATIVE

## 2018-07-16 LAB — HEPATIC FUNCTION PANEL
ALT: 38 U/L (ref 0–44)
AST: 41 U/L (ref 15–41)
Albumin: 2.2 g/dL — ABNORMAL LOW (ref 3.5–5.0)
Alkaline Phosphatase: 48 U/L (ref 38–126)
Bilirubin, Direct: <0.1 mg/dL (ref 0.0–0.2)
Total Bilirubin: 0.3 mg/dL (ref 0.3–1.2)
Total Protein: 7.2 g/dL (ref 6.5–8.1)

## 2018-07-16 LAB — CBC WITH DIFFERENTIAL/PLATELET
Abs Immature Granulocytes: 0.15 10*3/uL — ABNORMAL HIGH (ref 0.00–0.07)
Basophils Absolute: 0 10*3/uL (ref 0.0–0.1)
Basophils Relative: 0 %
EOS PCT: 0 %
Eosinophils Absolute: 0 10*3/uL (ref 0.0–0.5)
HEMATOCRIT: 39.5 % (ref 36.0–46.0)
HEMOGLOBIN: 12.5 g/dL (ref 12.0–15.0)
Immature Granulocytes: 1 %
LYMPHS ABS: 2.8 10*3/uL (ref 0.7–4.0)
Lymphocytes Relative: 16 %
MCH: 27.9 pg (ref 26.0–34.0)
MCHC: 31.6 g/dL (ref 30.0–36.0)
MCV: 88.2 fL (ref 80.0–100.0)
MONO ABS: 1.1 10*3/uL — AB (ref 0.1–1.0)
MONOS PCT: 6 %
NRBC: 0 % (ref 0.0–0.2)
Neutro Abs: 14.1 10*3/uL — ABNORMAL HIGH (ref 1.7–7.7)
Neutrophils Relative %: 77 %
Platelets: 289 10*3/uL (ref 150–400)
RBC: 4.48 MIL/uL (ref 3.87–5.11)
RDW: 14.6 % (ref 11.5–15.5)
WBC: 18.2 10*3/uL — ABNORMAL HIGH (ref 4.0–10.5)

## 2018-07-16 LAB — BASIC METABOLIC PANEL
Anion gap: 9 (ref 5–15)
BUN: 13 mg/dL (ref 6–20)
CHLORIDE: 99 mmol/L (ref 98–111)
CO2: 25 mmol/L (ref 22–32)
CREATININE: 0.88 mg/dL (ref 0.44–1.00)
Calcium: 8.4 mg/dL — ABNORMAL LOW (ref 8.9–10.3)
GFR calc Af Amer: >60 mL/min (ref >60)
GFR calc non Af Amer: >60 mL/min (ref >60)
GLUCOSE: 97 mg/dL (ref 70–99)
Potassium: 3.7 mmol/L (ref 3.5–5.1)
Sodium: 133 mmol/L — ABNORMAL LOW (ref 135–145)

## 2018-07-16 LAB — HIV ANTIBODY (ROUTINE TESTING W REFLEX): HIV Screen 4th Generation wRfx: NONREACTIVE

## 2018-07-16 LAB — LACTIC ACID, PLASMA
Lactic Acid, Venous: 0.8 mmol/L (ref 0.5–1.9)
Lactic Acid, Venous: 0.9 mmol/L (ref 0.5–1.9)
Lactic Acid, Venous: 1.9 mmol/L (ref 0.5–1.9)

## 2018-07-16 LAB — MRSA PCR SCREENING: MRSA BY PCR: NEGATIVE

## 2018-07-16 LAB — INFLUENZA PANEL BY PCR (TYPE A & B)
INFLAPCR: NEGATIVE
Influenza B By PCR: NEGATIVE

## 2018-07-16 LAB — PREGNANCY, URINE: Preg Test, Ur: NEGATIVE

## 2018-07-16 LAB — TROPONIN I

## 2018-07-16 MED ORDER — SODIUM CHLORIDE 0.9 % IV BOLUS
1000.0000 mL | Freq: Once | INTRAVENOUS | Status: AC
  Administered 2018-07-16: 1000 mL via INTRAVENOUS

## 2018-07-16 MED ORDER — PANTOPRAZOLE SODIUM 40 MG PO TBEC
40.0000 mg | DELAYED_RELEASE_TABLET | Freq: Every day | ORAL | Status: DC
  Administered 2018-07-16 – 2018-07-19 (×4): 40 mg via ORAL
  Filled 2018-07-16 (×4): qty 1

## 2018-07-16 MED ORDER — SODIUM CHLORIDE 0.9 % IV SOLN
INTRAVENOUS | Status: DC
  Administered 2018-07-16 (×2): via INTRAVENOUS

## 2018-07-16 MED ORDER — KETOROLAC TROMETHAMINE 15 MG/ML IJ SOLN
15.0000 mg | Freq: Four times a day (QID) | INTRAMUSCULAR | Status: AC
  Administered 2018-07-16 – 2018-07-18 (×7): 15 mg via INTRAVENOUS
  Filled 2018-07-16 (×8): qty 1

## 2018-07-16 MED ORDER — SODIUM CHLORIDE 0.9 % IV SOLN
500.0000 mg | INTRAVENOUS | Status: DC
  Administered 2018-07-16 – 2018-07-17 (×2): 500 mg via INTRAVENOUS
  Filled 2018-07-16 (×2): qty 500

## 2018-07-16 MED ORDER — SODIUM CHLORIDE 0.9 % IV SOLN: 500.0000 mg | INTRAVENOUS | Status: DC

## 2018-07-16 MED ORDER — ONDANSETRON HCL 4 MG/2ML IJ SOLN
4.0000 mg | Freq: Four times a day (QID) | INTRAMUSCULAR | Status: DC | PRN
  Administered 2018-07-16: 4 mg via INTRAVENOUS
  Filled 2018-07-16: qty 2

## 2018-07-16 MED ORDER — ENOXAPARIN SODIUM 40 MG/0.4ML ~~LOC~~ SOLN
40.0000 mg | Freq: Every day | SUBCUTANEOUS | Status: DC
  Filled 2018-07-16: qty 0.4

## 2018-07-16 MED ORDER — SODIUM CHLORIDE 0.9 % IV SOLN
1.0000 g | Freq: Once | INTRAVENOUS | Status: AC
  Administered 2018-07-16: 1 g via INTRAVENOUS
  Filled 2018-07-16: qty 10

## 2018-07-16 MED ORDER — ACETAMINOPHEN 325 MG PO TABS
650.0000 mg | ORAL_TABLET | Freq: Four times a day (QID) | ORAL | Status: DC | PRN
  Administered 2018-07-16 – 2018-07-20 (×11): 650 mg via ORAL
  Filled 2018-07-16 (×11): qty 2

## 2018-07-16 MED ORDER — IPRATROPIUM BROMIDE 0.02 % IN SOLN
0.5000 mg | Freq: Once | RESPIRATORY_TRACT | Status: AC
  Administered 2018-07-16: 0.5 mg via RESPIRATORY_TRACT
  Filled 2018-07-16: qty 2.5

## 2018-07-16 MED ORDER — ALBUTEROL SULFATE (2.5 MG/3ML) 0.083% IN NEBU
5.0000 mg | INHALATION_SOLUTION | Freq: Once | RESPIRATORY_TRACT | Status: AC
  Administered 2018-07-16: 5 mg via RESPIRATORY_TRACT
  Filled 2018-07-16: qty 6

## 2018-07-16 MED ORDER — GUAIFENESIN-DM 100-10 MG/5ML PO SYRP
5.0000 mL | ORAL_SOLUTION | ORAL | Status: DC | PRN
  Administered 2018-07-16 – 2018-07-18 (×8): 5 mL via ORAL
  Filled 2018-07-16 (×8): qty 10

## 2018-07-16 MED ORDER — IBUPROFEN 800 MG PO TABS
800.0000 mg | ORAL_TABLET | Freq: Once | ORAL | Status: AC
  Administered 2018-07-16: 800 mg via ORAL
  Filled 2018-07-16: qty 1

## 2018-07-16 MED ORDER — ACETAMINOPHEN 650 MG RE SUPP: 650.0000 mg | Freq: Four times a day (QID) | RECTAL | Status: DC | PRN

## 2018-07-16 MED ORDER — SODIUM CHLORIDE 0.9 % IV SOLN
1.0000 g | INTRAVENOUS | Status: DC
  Filled 2018-07-16: qty 10

## 2018-07-16 MED ORDER — ONDANSETRON HCL 4 MG PO TABS: 4.0000 mg | ORAL_TABLET | Freq: Four times a day (QID) | ORAL | Status: DC | PRN

## 2018-07-16 MED ORDER — SODIUM CHLORIDE 0.9 % IV SOLN
500.0000 mg | Freq: Once | INTRAVENOUS | Status: AC
  Administered 2018-07-16: 500 mg via INTRAVENOUS
  Filled 2018-07-16: qty 500

## 2018-07-16 MED ORDER — IPRATROPIUM-ALBUTEROL 0.5-2.5 (3) MG/3ML IN SOLN
3.0000 mL | RESPIRATORY_TRACT | Status: DC | PRN
  Administered 2018-07-16 – 2018-07-20 (×9): 3 mL via RESPIRATORY_TRACT
  Filled 2018-07-16 (×7): qty 3
  Filled 2018-07-16: qty 30
  Filled 2018-07-16: qty 3

## 2018-07-16 NOTE — ED Notes (Signed)
EKG given to EDP Rancour for review due to Hospitalist not being in the ED at the time of EKG being exported.

## 2018-07-16 NOTE — ED Notes (Signed)
ED TO INPATIENT HANDOFF REPORT  Name/Age/Gender Sheri Hall 47 y.o. female  Code Status Code Status History    Date Active Date Inactive Code Status Order ID Comments User Context   09/16/2017 1700 09/21/2017 1508 Full Code [id number]  Talmage Nap, NP Inpatient   09/16/2017 0418 09/16/2017 1535 Full Code [id number]  Gilda Crease, MD ED      Home/SNF/Other Home  Chief Complaint Back Pain;Cough  Level of Care/Admitting Diagnosis ED Disposition    ED Disposition Condition Comment   Admit  Hospital Area: [facility] [[id number]]  Level of Care: Stepdown [14]  Admit to SDU based on following criteria: Severe physiological/psychological symptoms:  Any diagnosis requiring assessment & intervention at least every 4 hours on an ongoing basis to obtain desired patient outcomes including stability and rehabilitation  Diagnosis: Sepsis [facility]) [[id number]]  Admitting Physician: Eduard Clos [phone]  Attending Physician: Eduard Clos [phone]  Estimated length of stay: past midnight tomorrow  Certification:: I certify this patient will need inpatient services for at least 2 midnights  PT Class (Do Not Modify): Inpatient [101]  PT Acc Code (Do Not Modify): Private [1]       Medical History Past Medical History:  Diagnosis Date  . Anxiety   . Arthritis    rheumatoid   . Asthma    seasonal  . Bipolar disorder (HCC)   . Depression   . GERD (gastroesophageal reflux disease)   . OCD (obsessive compulsive disorder)   . PTSD (post-traumatic stress disorder)     Allergies No Known Allergies  IV Location/Drains/Wounds Patient Lines/Drains/Airways Status   Active Line/Drains/Airways    Name:   Placement date:   Placement time:   Site:   Days:   Peripheral IV 07/16/18 Right Antecubital   07/16/18    0130    Antecubital   less than 1   Incision (Closed) 10/15/15 Arm Left   10/15/15    0827     1005          Labs/Imaging Results for orders  placed or performed during the hospital encounter of 07/16/18 (from the past 48 hour(s))  Lactic acid, plasma     Status: None   Collection Time: 07/16/18  1:01 AM  Result Value Ref Range   Lactic Acid, Venous 0.8 0.5 - 1.9 mmol/L    Comment: Performed at [facility], [address], [place], [place] [address]  CBC with Differential     Status: Abnormal   Collection Time: 07/16/18  1:26 AM  Result Value Ref Range   WBC 18.2 (H) 4.0 - 10.5 K/uL    Comment: WHITE COUNT CONFIRMED ON SMEAR   RBC 4.48 3.87 - 5.11 MIL/uL   Hemoglobin 12.5 12.0 - 15.0 g/dL   HCT 91.6 38.4 - 66.5 %   MCV 88.2 80.0 - 100.0 fL   MCH 27.9 26.0 - 34.0 pg   MCHC 31.6 30.0 - 36.0 g/dL   RDW 99.3 57.0 - 17.7 %   Platelets 289 150 - 400 K/uL   nRBC 0.0 0.0 - 0.2 %   Neutrophils Relative % 77 %   Neutro Abs 14.1 (H) 1.7 - 7.7 K/uL   Lymphocytes Relative 16 %   Lymphs Abs 2.8 0.7 - 4.0 K/uL   Monocytes Relative 6 %   Monocytes Absolute 1.1 (H) 0.1 - 1.0 K/uL   Eosinophils Relative 0 %   Eosinophils Absolute 0.0 0.0 - 0.5 K/uL   Basophils Relative 0 %  Basophils Absolute 0.0 0.0 - 0.1 K/uL   Immature Granulocytes 1 %   Abs Immature Granulocytes 0.15 (H) 0.00 - 0.07 K/uL    Comment: Performed at [facility], [address], [place], [place] [address]  Basic metabolic panel     Status: Abnormal   Collection Time: 07/16/18  1:26 AM  Result Value Ref Range   Sodium 133 (L) 135 - 145 mmol/L   Potassium 3.7 3.5 - 5.1 mmol/L   Chloride 99 98 - 111 mmol/L   CO2 25 22 - 32 mmol/L   Glucose, Bld 97 70 - 99 mg/dL   BUN 13 6 - 20 mg/dL   Creatinine, Ser 8.41 0.44 - 1.00 mg/dL   Calcium 8.4 (L) 8.9 - 10.3 mg/dL   GFR calc non Af Amer >60 >60 mL/min   GFR calc Af Amer >60 >60 mL/min   Anion gap 9 5 - 15    Comment: Performed at [facility], [address], [place], [place] [address]   Dg Chest 2 View  Result Date: 07/15/2018 CLINICAL DATA:  Initial  evaluation for acute back pain and cough. EXAM: CHEST - 2 VIEW COMPARISON:  Prior CT from 01/11/2017. FINDINGS: Cardiac and mediastinal silhouettes are within normal limits. Lungs are mildly hypoinflated. Parenchymal infiltrate within the right upper lobe, concerning for pneumonia. Mild scattered bibasilar atelectatic changes. No pulmonary edema or pleural effusion. No pneumothorax. No acute osseous abnormality. IMPRESSION: Hazy right upper lobe infiltrate, concerning for pneumonia. Electronically Signed   By: Rise Mu M.D.   On: 07/15/2018 23:37    Pending Labs Unresulted Labs (From admission, onward)    Start     Ordered   07/16/18 0259  Influenza panel by PCR (type A & B)  (Influenza PCR Panel)  Once,   R     07/16/18 0258   07/16/18 0101  Blood culture (routine x 2)  BLOOD CULTURE X 2,   STAT     07/16/18 0101   07/16/18 0101  Urinalysis, Routine w reflex microscopic  Once,   R     07/16/18 0101   07/16/18 0101  Urine culture  ONCE - STAT,   STAT     07/16/18 0101   07/16/18 0101  Pregnancy, urine  ONCE - STAT,   STAT     07/16/18 0101          Vitals/Pain Today's Vitals   07/16/18 0130 07/16/18 0200 07/16/18 0230 07/16/18 0300  BP: 98/67 90/79 (!) 94/52 96/62  Pulse: 100 94 (!) 115 (!) 114  Resp: 17 18 17 17   Temp:      TempSrc:      SpO2: 100% 99% 100% 100%  PainSc:        Isolation Precautions Droplet precaution  Medications Medications  sodium chloride 0.9 % bolus 1,000 mL (1,000 mLs Intravenous New Bag/Given 07/16/18 0323)  sodium chloride 0.9 % bolus 1,000 mL (0 mLs Intravenous Stopped 07/16/18 0200)  albuterol (PROVENTIL) (2.5 MG/3ML) 0.083% nebulizer solution 5 mg (5 mg Nebulization Given 07/16/18 0153)  ipratropium (ATROVENT) nebulizer solution 0.5 mg (0.5 mg Nebulization Given 07/16/18 0153)  cefTRIAXone (ROCEPHIN) 1 g in sodium chloride 0.9 % 100 mL IVPB (0 g Intravenous Stopped 07/16/18 0218)  azithromycin (ZITHROMAX) 500 mg in sodium chloride  0.9 % 250 mL IVPB (500 mg Intravenous New Bag/Given 07/16/18 0224)  ibuprofen (ADVIL,MOTRIN) tablet 800 mg (800 mg Oral Given 07/16/18 0231)    Mobility walks

## 2018-07-16 NOTE — ED Provider Notes (Signed)
[facility]-EMERGENCY DEPT Provider Note   CSN: [id number] Arrival date & time: 07/15/18  2206     History   Chief Complaint Chief Complaint  Patient presents with  . Cough  . Back Pain    HPI Sheri Hall is a 47 y.o. female.  The history is provided by the patient and medical records.  Cough   Back Pain      47 y.o. F with hx of anxiety, RA, asthma, bipolar disorder, depression, GERD, PTSD, presenting to the ED for cough and right upper back pain.  States this initially started about 4-5 days ago but has been worsening.  Cough is deep, wet, productive of thick mucous.  Deep, aching pain in her right upper back near her shoulder blade.  She denies known fevers.  States overall she feels very poorly and weak.  She has not been able to eat or drink today-- trying to drink water but not tolerating it well.  She reports since arriving to the ED she has been having some SOB.  She has not had any medications at home for her symptoms.  Past Medical History:  Diagnosis Date  . Anxiety   . Arthritis    rheumatoid   . Asthma    seasonal  . Bipolar disorder (HCC)   . Depression   . GERD (gastroesophageal reflux disease)   . OCD (obsessive compulsive disorder)   . PTSD (post-traumatic stress disorder)     Patient Active Problem List   Diagnosis Date Noted  . PTSD (post-traumatic stress disorder) 09/17/2017  . Substance use disorder 09/17/2017  . Grief 09/17/2017  . MDD (major depressive disorder), severe (HCC) 09/16/2017    Past Surgical History:  Procedure Laterality Date  . CESAREAN SECTION    . OPEN REDUCTION INTERNAL FIXATION (ORIF) DISTAL RADIAL FRACTURE Left 10/15/2015   Procedure: OPEN TREATMENT OF LEFT DISTAL RADIUS FRACTURE;  Surgeon: Mack Hook, MD;  Location: [facility];  Service: Orthopedics;  Laterality: Left;     OB History   No obstetric history on file.      Home Medications    Prior to Admission  medications   Medication Sig Start Date End Date Taking? Authorizing Provider  ARIPiprazole (ABILIFY) 10 MG tablet Take 1 tablet (10 mg total) by mouth daily. For mood control 09/22/17   Money, Feliz Beam B, FNP  hydrOXYzine (ATARAX/VISTARIL) 50 MG tablet Take 1 tablet (50 mg total) by mouth every 6 (six) hours as needed for anxiety. 09/21/17   Money, Gerlene Burdock, FNP  prazosin (MINIPRESS) 2 MG capsule Take 1 capsule (2 mg total) by mouth at bedtime. For nightmares 09/21/17   Money, Gerlene Burdock, FNP  traZODone (DESYREL) 50 MG tablet Take 1 tablet (50 mg total) by mouth at bedtime as needed for sleep. 09/21/17   Money, Gerlene Burdock, FNP  venlafaxine XR (EFFEXOR-XR) 75 MG 24 hr capsule Take 1 capsule (75 mg total) by mouth daily. For mood control 09/22/17   Money, Gerlene Burdock, FNP    Family History History reviewed. No pertinent family history.  Social History Social History   Tobacco Use  . Smoking status: Current Every Day Smoker    Packs/day: 0.50    Types: Cigarettes  . Smokeless tobacco: Never Used  Substance Use Topics  . Alcohol use: Yes    Comment: daily  . Drug use: Yes    Types: Cocaine    Comment: wednesday March 22nd 2017     Allergies   Patient  has no known allergies.   Review of Systems Review of Systems  Respiratory: Positive for cough.   Musculoskeletal: Positive for back pain.  All other systems reviewed and are negative.    Physical Exam Updated Vital Signs BP (!) 87/56 Comment: RN aware  Pulse (!) 115   Temp 98.7 F (37.1 C) (Oral)   Resp 18   LMP 07/04/2018   SpO2 98%   Physical Exam Vitals signs and nursing note reviewed.  Constitutional:      Appearance: She is well-developed. She is ill-appearing.     Comments: Appears unwell, weak  HENT:     Head: Normocephalic and atraumatic.     Right Ear: Tympanic membrane and ear canal normal.     Left Ear: Tympanic membrane and ear canal normal.     Nose: Nose normal.     Mouth/Throat:     Lips: Pink.     Pharynx: No  pharyngeal swelling, oropharyngeal exudate or posterior oropharyngeal erythema.  Eyes:     Conjunctiva/sclera: Conjunctivae normal.     Pupils: Pupils are equal, round, and reactive to light.  Neck:     Musculoskeletal: Normal range of motion.  Cardiovascular:     Rate and Rhythm: Regular rhythm. Tachycardia present.     Heart sounds: Normal heart sounds.  Pulmonary:     Effort: Pulmonary effort is normal.     Breath sounds: No stridor. Decreased breath sounds present. No rhonchi.     Comments: Diminished breath sounds, no distress, able to speak in sentences Abdominal:     General: Bowel sounds are normal.     Palpations: Abdomen is soft. There is no mass.     Hernia: No hernia is present.  Musculoskeletal: Normal range of motion.     Comments: Right upper back without signs of trauma but endorses pain in this area; no deformities  Skin:    General: Skin is warm and dry.  Neurological:     Mental Status: She is alert and oriented to person, place, and time.      ED Treatments / Results  Labs (all labs ordered are listed, but only abnormal results are displayed) Labs Reviewed  CBC WITH DIFFERENTIAL/PLATELET - Abnormal; Notable for the following components:      Result Value   WBC 18.2 (*)    Neutro Abs 14.1 (*)    Monocytes Absolute 1.1 (*)    Abs Immature Granulocytes 0.15 (*)    All other components within normal limits  BASIC METABOLIC PANEL - Abnormal; Notable for the following components:   Sodium 133 (*)    Calcium 8.4 (*)    All other components within normal limits  CULTURE, BLOOD (ROUTINE X 2)  CULTURE, BLOOD (ROUTINE X 2)  URINE CULTURE  LACTIC ACID, PLASMA  URINALYSIS, ROUTINE W REFLEX MICROSCOPIC  PREGNANCY, URINE  INFLUENZA PANEL BY PCR (TYPE A & B)    EKG None  Radiology Dg Chest 2 View  Result Date: 07/15/2018 CLINICAL DATA:  Initial evaluation for acute back pain and cough. EXAM: CHEST - 2 VIEW COMPARISON:  Prior CT from 01/11/2017. FINDINGS:  Cardiac and mediastinal silhouettes are within normal limits. Lungs are mildly hypoinflated. Parenchymal infiltrate within the right upper lobe, concerning for pneumonia. Mild scattered bibasilar atelectatic changes. No pulmonary edema or pleural effusion. No pneumothorax. No acute osseous abnormality. IMPRESSION: Hazy right upper lobe infiltrate, concerning for pneumonia. Electronically Signed   By: Rise Mu M.D.   On: 07/15/2018 23:37  Procedures Procedures (including critical care time)  CRITICAL CARE Performed by: Garlon Hatchet   Total critical care time: 35 minutes  Critical care time was exclusive of separately billable procedures and treating other patients.  Critical care was necessary to treat or prevent imminent or life-threatening deterioration.  Critical care was time spent personally by me on the following activities: development of treatment plan with patient and/or surrogate as well as nursing, discussions with consultants, evaluation of patient's response to treatment, examination of patient, obtaining history from patient or surrogate, ordering and performing treatments and interventions, ordering and review of laboratory studies, ordering and review of radiographic studies, pulse oximetry and re-evaluation of patient's condition.   Medications Ordered in ED Medications  azithromycin (ZITHROMAX) 500 mg in sodium chloride 0.9 % 250 mL IVPB (500 mg Intravenous New Bag/Given 07/16/18 0224)  sodium chloride 0.9 % bolus 1,000 mL (has no administration in time range)  sodium chloride 0.9 % bolus 1,000 mL (0 mLs Intravenous Stopped 07/16/18 0200)  albuterol (PROVENTIL) (2.5 MG/3ML) 0.083% nebulizer solution 5 mg (5 mg Nebulization Given 07/16/18 0153)  ipratropium (ATROVENT) nebulizer solution 0.5 mg (0.5 mg Nebulization Given 07/16/18 0153)  cefTRIAXone (ROCEPHIN) 1 g in sodium chloride 0.9 % 100 mL IVPB (0 g Intravenous Stopped 07/16/18 0218)  ibuprofen  (ADVIL,MOTRIN) tablet 800 mg (800 mg Oral Given 07/16/18 0231)     Initial Impression / Assessment and Plan / ED Course  I have reviewed the triage vital signs and the nursing notes.  Pertinent labs & imaging results that were available during my care of the patient were reviewed by me and considered in my medical decision making (see chart for details).  47 year old female here with cough and right upper back pain.  Patient was unfortunately in the waiting room for 3 hours prior to my evaluation with low blood pressure into the 80s systolic and tachycardic to 115.  Upon being placed into room I immediately evaluated her and ordered labs including blood cultures, lactate.  Her chest x-ray is concerning for right upper lobe infiltrate which correlates clinically with her symptoms.  She will be started on Rocephin and azithromycin along with IV fluids.  2:49 AM After liter of fluids, BP still only up to 94/42, HR still 115.  Her normal BP seems to be around 110-130 systolic based on chart review.  Labs with WBC count 18K, otherwise reassuring.  Normal lactate.  Has IV abx infusing.  Given her persistent hypotension, she will require admission.  Blood cultures have been sent.  Discussed with Dr. Toniann Fail-- he will admit for ongoing care.  Requested flu panel which was ordered.  Final Clinical Impressions(s) / ED Diagnoses   Final diagnoses:  Community acquired pneumonia of right upper lobe of lung [facility])    ED Discharge Orders    None       Garlon Hatchet, PA-C 07/16/18 4401    Glynn Octave, MD 07/16/18 2032

## 2018-07-16 NOTE — ED Notes (Signed)
Patient made aware that urine sample is needed. States that she cannot use the bathroom because she is "in so much pain that she cannot move"

## 2018-07-16 NOTE — ED Notes (Signed)
Sputum sent to lab

## 2018-07-16 NOTE — Progress Notes (Signed)
47 year old female with history of cocaine use, rheumatoid arthritis and depression came to the hospital complains of upper back pain.  She was found to have a right upper lobe infiltrates concerns with pneumonia.  She also had elevated WBC count.  Started on azithromycin and Rocephin.  Influenza panel was negative.  Patient denies any risk of aspiration but she does tell me that she eats lots of junk food while laying down and not sure if she could have aspirated.  Feels a little better this morning.  This morning her temperature was 100.8, slightly tachycardic and borderline hypotension with systolic of 90s.  Saturating 98% on room air  Due to concerns of possible aspiration leading to pneumonia/pneumonitis-we will treated with IV antibiotics.  Patient has leukocytosis.  Will stop Rocephin and switch her to Unasyn for anaerobic coverage.  Continue azithromycin for atypical coverage as well. -Add incentive spirometry and flutter valve -Nebulizer as needed  We will continue to monitor the patient in the stepdown unit given borderline low blood pressure.  Holding off on any antihypertensives.    Call with further questions as needed.  Stephania Fragmin MD

## 2018-07-16 NOTE — H&P (Signed)
History and Physical    Sheri Hall YTK:[id number] DOB: 02-Sep-1970 DOA: 07/16/2018  PCP: Patient, No Pcp Per  Patient coming from: Home.  Chief Complaint: Right upper back pain.  HPI: Sheri Hall is a 47 y.o. female with history of cocaine abuse, rheumatoid arthritis depression presents to the ER with possible complaint of upper back pain.  Patient has benign pleuritic type of upper back pain for the last 2 days with productive cough with discolored sputum greenish in color.  Subjective feeling of fever and chills.  Admits to taking cocaine 3 days ago.  Denies any IV drug abuse.  ED Course: In the ER patient was tachycardic hypotensive and lab work showed leukocytosis of 18,000.  Chest x-ray shows right upper lobe infiltrates.  Patient was given fluid bolus for sepsis and admitted for sepsis secondary to pneumonia.  Review of Systems: As per HPI, rest all negative.   Past Medical History:  Diagnosis Date  . Anxiety   . Arthritis    rheumatoid   . Asthma    seasonal  . Bipolar disorder (HCC)   . Depression   . GERD (gastroesophageal reflux disease)   . OCD (obsessive compulsive disorder)   . PTSD (post-traumatic stress disorder)     Past Surgical History:  Procedure Laterality Date  . CESAREAN SECTION    . OPEN REDUCTION INTERNAL FIXATION (ORIF) DISTAL RADIAL FRACTURE Left 10/15/2015   Procedure: OPEN TREATMENT OF LEFT DISTAL RADIUS FRACTURE;  Surgeon: Mack Hook, MD;  Location: [facility];  Service: Orthopedics;  Laterality: Left;     reports that she has been smoking cigarettes. She has been smoking about 0.50 packs per day. She has never used smokeless tobacco. She reports current alcohol use. She reports current drug use. Drug: Cocaine.  No Known Allergies  Family History  Family history unknown: Yes    Prior to Admission medications   Medication Sig Start Date End Date Taking? Authorizing Provider  ARIPiprazole (ABILIFY) 10 MG tablet  Take 1 tablet (10 mg total) by mouth daily. For mood control Patient not taking: Reported on 07/16/2018 09/22/17   Money, Gerlene Burdock, FNP  hydrOXYzine (ATARAX/VISTARIL) 50 MG tablet Take 1 tablet (50 mg total) by mouth every 6 (six) hours as needed for anxiety. Patient not taking: Reported on 07/16/2018 09/21/17   Money, Gerlene Burdock, FNP  prazosin (MINIPRESS) 2 MG capsule Take 1 capsule (2 mg total) by mouth at bedtime. For nightmares Patient not taking: Reported on 07/16/2018 09/21/17   Money, Gerlene Burdock, FNP  traZODone (DESYREL) 50 MG tablet Take 1 tablet (50 mg total) by mouth at bedtime as needed for sleep. Patient not taking: Reported on 07/16/2018 09/21/17   Money, Gerlene Burdock, FNP  venlafaxine XR (EFFEXOR-XR) 75 MG 24 hr capsule Take 1 capsule (75 mg total) by mouth daily. For mood control Patient not taking: Reported on 07/16/2018 09/22/17   Maryfrances Bunnell, FNP    Physical Exam: Vitals:   07/16/18 0230 07/16/18 0300 07/16/18 0330 07/16/18 0400  BP: (!) 94/52 96/62 (!) 87/49 (!) 89/48  Pulse: (!) 115 (!) 114 (!) 116 (!) 114  Resp: 17 17 (!) 21 (!) 22  Temp:      TempSrc:      SpO2: 100% 100% 98% 97%      Constitutional: Moderately built and nourished. Vitals:   07/16/18 0230 07/16/18 0300 07/16/18 0330 07/16/18 0400  BP: (!) 94/52 96/62 (!) 87/49 (!) 89/48  Pulse: (!) 115 (!) 114 (!) 116 Marland Kitchen)  114  Resp: 17 17 (!) 21 (!) 22  Temp:      TempSrc:      SpO2: 100% 100% 98% 97%   Eyes: Anicteric no pallor. ENMT: No discharge from the ears eyes nose or mouth. Neck: No mass felt.  No neck rigidity.  No JVD appreciated. Respiratory: No rhonchi or crepitations. Cardiovascular: S1-S2 heard tachycardic. Abdomen: Soft nontender bowel sounds present. Musculoskeletal: Disfigured joints of the hands. Skin: No rash. Neurologic: Alert awake oriented to time place and person.  Moves all extremities. Psychiatric: Appears normal.   Labs on Admission: I have personally reviewed following labs and imaging  studies  CBC: Recent Labs  Lab 07/16/18 0126  WBC 18.2*  NEUTROABS 14.1*  HGB 12.5  HCT 39.5  MCV 88.2  PLT 289   Basic Metabolic Panel: Recent Labs  Lab 07/16/18 0126  NA 133*  K 3.7  CL 99  CO2 25  GLUCOSE 97  BUN 13  CREATININE 0.88  CALCIUM 8.4*   GFR: CrCl cannot be calculated (Unknown ideal weight.). Liver Function Tests: No results for input(s): AST, ALT, ALKPHOS, BILITOT, PROT, ALBUMIN in the last 168 hours. No results for input(s): LIPASE, AMYLASE in the last 168 hours. No results for input(s): AMMONIA in the last 168 hours. Coagulation Profile: No results for input(s): INR, PROTIME in the last 168 hours. Cardiac Enzymes: No results for input(s): CKTOTAL, CKMB, CKMBINDEX, TROPONINI in the last 168 hours. BNP (last 3 results) No results for input(s): PROBNP in the last 8760 hours. HbA1C: No results for input(s): HGBA1C in the last 72 hours. CBG: No results for input(s): GLUCAP in the last 168 hours. Lipid Profile: No results for input(s): CHOL, HDL, LDLCALC, TRIG, CHOLHDL, LDLDIRECT in the last 72 hours. Thyroid Function Tests: No results for input(s): TSH, T4TOTAL, FREET4, T3FREE, THYROIDAB in the last 72 hours. Anemia Panel: No results for input(s): VITAMINB12, FOLATE, FERRITIN, TIBC, IRON, RETICCTPCT in the last 72 hours. Urine analysis: No results found for: COLORURINE, APPEARANCEUR, LABSPEC, PHURINE, GLUCOSEU, HGBUR, BILIRUBINUR, KETONESUR, PROTEINUR, UROBILINOGEN, NITRITE, LEUKOCYTESUR Sepsis Labs: @LABRCNTIP (procalcitonin:4,lacticidven:4) )No results found for this or any previous visit (from the past 240 hour(s)).   Radiological Exams on Admission: Dg Chest 2 View  Result Date: 07/15/2018 CLINICAL DATA:  Initial evaluation for acute back pain and cough. EXAM: CHEST - 2 VIEW COMPARISON:  Prior CT from 01/11/2017. FINDINGS: Cardiac and mediastinal silhouettes are within normal limits. Lungs are mildly hypoinflated. Parenchymal infiltrate within  the right upper lobe, concerning for pneumonia. Mild scattered bibasilar atelectatic changes. No pulmonary edema or pleural effusion. No pneumothorax. No acute osseous abnormality. IMPRESSION: Hazy right upper lobe infiltrate, concerning for pneumonia. Electronically Signed   By: Rise Mu M.D.   On: 07/15/2018 23:37      Assessment/Plan Principal Problem:   Sepsis (HCC) Active Problems:   CAP (community acquired pneumonia)    1. Sepsis secondary to pneumonia for which patient has been placed on ceftriaxone and Zithromax.  Follow blood cultures sputum cultures hydrate follow urine for Legionella strep antigen.  Influenza PCR was negative.  If the right upper back pain persist will need repeat x-ray or chest CT chest to rule out any empyema.  Present chest x-ray does not show any pleural effusion. 2. Drug abuse had used cocaine 3 days ago for which I have counseled and advised to quit. 3. History of rheumatoid arthritis with disfigured hand joints has not been to a rheumatologist for many years and has last used methotrexate more than  3 years ago.  Will need outpatient referral. 4. History of depression presently off medications denies any suicidal ideation.  EKG LFTs urine drug screen are pending.   DVT prophylaxis: Lovenox. Code Status: Full code. Family Communication: Discussed with patient. Disposition Plan: Home. Consults called: None. Admission status: Inpatient.   Eduard Clos MD [facility] Pager [phone].  If 7PM-7AM, please contact night-coverage [email or web address] Password [email or web address]  07/16/2018, 4:44 AM

## 2018-07-17 DIAGNOSIS — M069 Rheumatoid arthritis, unspecified: Secondary | ICD-10-CM

## 2018-07-17 DIAGNOSIS — J181 Lobar pneumonia, unspecified organism: Secondary | ICD-10-CM

## 2018-07-17 DIAGNOSIS — F1490 Cocaine use, unspecified, uncomplicated: Secondary | ICD-10-CM

## 2018-07-17 DIAGNOSIS — D649 Anemia, unspecified: Secondary | ICD-10-CM

## 2018-07-17 LAB — CBC WITH DIFFERENTIAL/PLATELET
Abs Immature Granulocytes: 0.07 10*3/uL (ref 0.00–0.07)
Basophils Absolute: 0 10*3/uL (ref 0.0–0.1)
Basophils Relative: 0 %
EOS PCT: 0 %
Eosinophils Absolute: 0 10*3/uL (ref 0.0–0.5)
HCT: 30.3 % — ABNORMAL LOW (ref 36.0–46.0)
Hemoglobin: 9.2 g/dL — ABNORMAL LOW (ref 12.0–15.0)
Immature Granulocytes: 1 %
Lymphocytes Relative: 24 %
Lymphs Abs: 2.9 10*3/uL (ref 0.7–4.0)
MCH: 27.2 pg (ref 26.0–34.0)
MCHC: 30.4 g/dL (ref 30.0–36.0)
MCV: 89.6 fL (ref 80.0–100.0)
Monocytes Absolute: 0.9 10*3/uL (ref 0.1–1.0)
Monocytes Relative: 8 %
Neutro Abs: 7.9 10*3/uL — ABNORMAL HIGH (ref 1.7–7.7)
Neutrophils Relative %: 67 %
Platelets: 222 10*3/uL (ref 150–400)
RBC: 3.38 MIL/uL — ABNORMAL LOW (ref 3.87–5.11)
RDW: 15 % (ref 11.5–15.5)
WBC: 11.8 10*3/uL — ABNORMAL HIGH (ref 4.0–10.5)
nRBC: 0 % (ref 0.0–0.2)

## 2018-07-17 LAB — URINE CULTURE: Culture: <10000 — AB

## 2018-07-17 LAB — BASIC METABOLIC PANEL
Anion gap: 5 (ref 5–15)
BUN: 10 mg/dL (ref 6–20)
CO2: 21 mmol/L — AB (ref 22–32)
Calcium: 7.3 mg/dL — ABNORMAL LOW (ref 8.9–10.3)
Chloride: 111 mmol/L (ref 98–111)
Creatinine, Ser: 0.68 mg/dL (ref 0.44–1.00)
GFR calc non Af Amer: >60 mL/min (ref >60)
Glucose, Bld: 116 mg/dL — ABNORMAL HIGH (ref 70–99)
Potassium: 3.6 mmol/L (ref 3.5–5.1)
Sodium: 137 mmol/L (ref 135–145)

## 2018-07-17 LAB — MAGNESIUM: Magnesium: 1.9 mg/dL (ref 1.7–2.4)

## 2018-07-17 MED ORDER — SENNA 8.6 MG PO TABS
1.0000 | ORAL_TABLET | Freq: Every day | ORAL | Status: DC
  Filled 2018-07-17 (×2): qty 1

## 2018-07-17 MED ORDER — ZOLPIDEM TARTRATE 5 MG PO TABS
5.0000 mg | ORAL_TABLET | Freq: Once | ORAL | Status: DC
  Filled 2018-07-17 (×2): qty 1

## 2018-07-17 MED ORDER — TRAMADOL HCL 50 MG PO TABS
50.0000 mg | ORAL_TABLET | Freq: Two times a day (BID) | ORAL | Status: DC | PRN
  Administered 2018-07-17 – 2018-07-19 (×5): 50 mg via ORAL
  Filled 2018-07-17 (×5): qty 1

## 2018-07-17 MED ORDER — POLYETHYLENE GLYCOL 3350 17 G PO PACK
17.0000 g | PACK | Freq: Two times a day (BID) | ORAL | Status: DC
  Administered 2018-07-17 – 2018-07-18 (×2): 17 g via ORAL
  Filled 2018-07-17 (×5): qty 1

## 2018-07-17 MED ORDER — ALUM & MAG HYDROXIDE-SIMETH 200-200-20 MG/5ML PO SUSP
30.0000 mL | Freq: Four times a day (QID) | ORAL | Status: DC | PRN
  Administered 2018-07-17: 30 mL via ORAL
  Filled 2018-07-17: qty 30

## 2018-07-17 NOTE — Progress Notes (Signed)
  PROGRESS NOTE  Sheri Hall [id number]:[id number] DOB: [birth year] DOA: 07/16/2018 PCP: Norm Salt, PA  Brief Narrative: 47 year old woman PMH cocaine abuse, rheumatoid arthritis presented with right upper back pain, productive cough, recent cocaine use.  Tachycardic, hypotensive with leukocytosis, chest x-ray showed right upper lobe infiltrate.  Admitted for sepsis secondary to pneumonia.  Assessment/Plan Sepsis secondary to right upper lobe pneumonia.  Influenza negative.  Chest x-ray hazy right upper lobe infiltrate.  Some concern for aspiration was noted and therefore patient was changed to Unasyn.  Has been monitored in stepdown for borderline low blood pressures. --Blood pressure borderline but stable.  No tachycardia, telemetry unremarkable.  Suspect near baseline.  Leukocytosis trending down, however still short of breath. --Continue empiric antibiotics, follow-up culture data, may be able to transfer out of stepdown unit later today  Normocytic anemia, may be below baseline --No evidence of bleeding, suspect related to menses --Trend CBC, check anemia panel in a.m.  Cocaine use, drug abuse --Will recommend cessation.  Rheumatoid arthritis, not followed currently by rheumatology, has not used methotrexate for more than 3 years --Will need outpatient follow-up with PCP and suggest referral to rheumatology  Depression, anxiety, not on any outpatient meds --Will need outpatient follow-up  DVT prophylaxis: enoxaparin Code Status: Full Family Communication:  Disposition Plan: home    Brendia Sacks, MD  [facility] Direct contact: [phone] --Via amion app OR  --[email or web address]; password [email or web address]  7PM-7AM contact night coverage as above 07/17/2018, 8:32 AM  LOS: 1 day   Consultants:    Procedures:    Antimicrobials:  Unasyn  Zithromax  Interval history/Subjective: Still has productive cough, gets short of breath with exertion, moving around, speaking.   Tolerating diet but has some nausea.  No bowel movement since admission  Objective: Vitals:  Vitals:   07/17/18 0405 07/17/18 0740  BP: 99/65   Pulse: 71   Resp: 16   Temp:  98 F (36.7 C)  SpO2: 100%     Exam:  Constitutional:  . Appears calm, weak, uncomfortable but not toxic. Eyes:  . pupils and irises appear normal ENMT:  . grossly normal hearing  . Lips appear normal Respiratory:  . Poor air movement, no frank wheezes, rales or rhonchi. Marland Kitchen Respiratory effort moderately increased.  Speaks with a soft voice, appears somewhat short of breath. Cardiovascular:  . RRR, no m/r/g . No LE extremity edema . Telemetry sinus rhythm Abdomen:  . Soft, mild generalized tenderness Skin:  . No rashes, lesions, ulcers noted Psychiatric:  . Mental status o Mood, affect appropriate  I have personally reviewed the following:   Data: . BMP unremarkable, magnesium within normal limits . WBC trending down, 11.8.  Hemoglobin 9.2  Scheduled Meds: . ketorolac  15 mg Intravenous Q6H  . pantoprazole  40 mg Oral Daily  . polyethylene glycol  17 g Oral BID  . senna  1 tablet Oral QHS   Continuous Infusions: . sodium chloride 125 mL/hr at 07/16/18 2309  . azithromycin (ZITHROMAX) 500 MG IVPB (Vial-Mate [employer]) Stopped (07/17/18 0009)    Principal Problem:   Sepsis (HCC) Active Problems:   Lobar pneumonia (HCC)   Normocytic anemia   Cocaine use   Rheumatoid arthritis (HCC)   LOS: 1 day

## 2018-07-18 ENCOUNTER — Encounter (HOSPITAL_COMMUNITY): Payer: Self-pay

## 2018-07-18 ENCOUNTER — Inpatient Hospital Stay (HOSPITAL_COMMUNITY): Payer: Medicaid Other

## 2018-07-18 LAB — RETICULOCYTES
Immature Retic Fract: 12 % (ref 2.3–15.9)
RBC.: 3.47 MIL/uL — ABNORMAL LOW (ref 3.87–5.11)
Retic Count, Absolute: 25 10*3/uL (ref 19.0–186.0)
Retic Ct Pct: 0.7 % (ref 0.4–3.1)

## 2018-07-18 LAB — CBC
HCT: 31.5 % — ABNORMAL LOW (ref 36.0–46.0)
Hemoglobin: 9.6 g/dL — ABNORMAL LOW (ref 12.0–15.0)
MCH: 27.7 pg (ref 26.0–34.0)
MCHC: 30.5 g/dL (ref 30.0–36.0)
MCV: 90.8 fL (ref 80.0–100.0)
Platelets: 236 10*3/uL (ref 150–400)
RBC: 3.47 MIL/uL — AB (ref 3.87–5.11)
RDW: 15.3 % (ref 11.5–15.5)
WBC: 9.2 10*3/uL (ref 4.0–10.5)
nRBC: 0 % (ref 0.0–0.2)

## 2018-07-18 LAB — IRON AND TIBC
IRON: 36 ug/dL (ref 28–170)
Saturation Ratios: 12 % (ref 10.4–31.8)
TIBC: 293 ug/dL (ref 250–450)
UIBC: 257 ug/dL

## 2018-07-18 LAB — LEGIONELLA PNEUMOPHILA SEROGP 1 UR AG: L. pneumophila Serogp 1 Ur Ag: NEGATIVE

## 2018-07-18 LAB — CULTURE, RESPIRATORY W GRAM STAIN: Culture: NORMAL

## 2018-07-18 LAB — FERRITIN: Ferritin: 71 ng/mL (ref 11–307)

## 2018-07-18 LAB — VITAMIN B12: Vitamin B-12: 176 pg/mL — ABNORMAL LOW (ref 180–914)

## 2018-07-18 LAB — CULTURE, RESPIRATORY

## 2018-07-18 LAB — FOLATE: Folate: 9.1 ng/mL (ref >5.9)

## 2018-07-18 MED ORDER — SODIUM CHLORIDE 0.9 % IV SOLN
3.0000 g | Freq: Four times a day (QID) | INTRAVENOUS | Status: DC
  Administered 2018-07-18 – 2018-07-20 (×8): 3 g via INTRAVENOUS
  Filled 2018-07-18 (×9): qty 3

## 2018-07-18 MED ORDER — SODIUM CHLORIDE (PF) 0.9 % IJ SOLN
INTRAMUSCULAR | Status: AC
  Filled 2018-07-18: qty 50

## 2018-07-18 MED ORDER — AZITHROMYCIN 250 MG PO TABS
500.0000 mg | ORAL_TABLET | Freq: Every day | ORAL | Status: DC
  Administered 2018-07-18 – 2018-07-19 (×2): 500 mg via ORAL
  Filled 2018-07-18 (×2): qty 2

## 2018-07-18 MED ORDER — IOPAMIDOL (ISOVUE-370) INJECTION 76%
100.0000 mL | Freq: Once | INTRAVENOUS | Status: AC | PRN
  Administered 2018-07-18: 100 mL via INTRAVENOUS

## 2018-07-18 MED ORDER — IOPAMIDOL (ISOVUE-370) INJECTION 76%
INTRAVENOUS | Status: AC
  Filled 2018-07-18: qty 100

## 2018-07-18 NOTE — Progress Notes (Addendum)
PROGRESS NOTE  Sheri Hall [id number]:[id number] DOB: [birth year] DOA: 07/16/2018 PCP: Norm Salt, PA  Brief Narrative: 47 year old woman PMH cocaine abuse, rheumatoid arthritis presented with right upper back pain, productive cough, recent cocaine use.  Tachycardic, hypotensive with leukocytosis, chest x-ray showed right upper lobe infiltrate.  Admitted for sepsis secondary to pneumonia.  Assessment/Plan Sepsis secondary to right upper lobe pneumonia.  Influenza negative.  Chest x-ray hazy right upper lobe infiltrate.  Some concern for aspiration was noted and therefore patient was changed to Unasyn.  Has been monitored in stepdown for borderline low blood pressures. --Sepsis has resolved.  Blood pressure has now normalized.  No hypoxia but still significantly short of breath and easily fatigued.  Leukocytosis has resolved. --Continue to follow culture data. --Given ongoing right upper back pain, ongoing shortness of breath, and previous abnormal CT June 2018 which at that time showed multiple small pulmonary nodules and presumed left upper lobe pneumonia with recommendation for follow-up CT--I am concerned about PE or complicating feature of pneumonia such as empyema. --Check CTA chest.  Normocytic anemia, may be below baseline --Presumably related to menses.  Hemoglobin stable.  Follow-up as an outpatient.  Cocaine use, drug abuse --Will recommend cessation.  Rheumatoid arthritis, not followed currently by rheumatology, has not used methotrexate for more than 3 years --Follow-up with rheumatology as an outpatient.  Depression, anxiety, not on any outpatient meds --Will need outpatient follow-up   Given stable hemodynamics, can transfer out of stepdown to telemetry today.  DVT prophylaxis: enoxaparin Code Status: Full Family Communication:  Disposition Plan: home    Brendia Sacks, MD  [facility] Direct contact: [phone] --Via amion app OR  --[email or web address];  password [email or web address]  7PM-7AM contact night coverage as above 07/18/2018, 9:01 AM  LOS: 2 days   Consultants:    Procedures:    Antimicrobials:  Unasyn 12/29 >  Ceftriaxone 12/27  Zithromax 12/27 >  Interval history/Subjective: Slept well for the first time.  Eating well.  Still quite short of breath, easily fatigued when talking or eating.  Still has significant right upper back pain exacerbated by movement and deep breath.  No recent travel, surgery or prolonged immobility.  No leg pain.  Objective: Vitals:  Vitals:   07/18/18 0800 07/18/18 0820  BP: 139/81   Pulse: 75 74  Resp: 20 (!) 28  Temp: 98.2 F (36.8 C)   SpO2: 100% 99%    Exam:  Constitutional:   . Appears calm, mildly uncomfortable, short of breath Eyes:  . pupils and irises appear normal ENMT:  . grossly normal hearing  . Lips appear normal Respiratory:  . CTA bilaterally, no w/r/r.  Diminished breath sounds posteriorly. Marland Kitchen Respiratory effort moderately increased, easily fatigues when speaking or eating.  Speaks in short sentences. Cardiovascular:  . RRR, no m/r/g . No LE extremity edema   . Telemetry sinus rhythm Skin:  . No rashes, lesions, ulcers noted Psychiatric:  . Mental status o Mood, affect appropriate . judgment and insight appear intact    I have personally reviewed the following:   Data: . Iron studies noted.  Hemoglobin stable at 9.6.  Leukocytosis resolved, 9.2.  Scheduled Meds: . pantoprazole  40 mg Oral Daily  . polyethylene glycol  17 g Oral BID  . senna  1 tablet Oral QHS  . zolpidem  5 mg Oral Once   Continuous Infusions: . azithromycin (ZITHROMAX) 500 MG IVPB (Vial-Mate [employer]) Stopped (07/17/18 2244)    Principal Problem:   Sepsis (HCC) Active Problems:  Lobar pneumonia (HCC)   Normocytic anemia   Cocaine use   Rheumatoid arthritis (HCC)   LOS: 2 days

## 2018-07-18 NOTE — Progress Notes (Signed)
Pharmacy Antibiotic Note  Sheri Hall is a 47 y.o. female admitted on 07/16/2018 with pneumonia.  Pharmacy has been consulted for Unasyn dosing.  Plan: Unasyn 3gm IV q6h Change Zithromax to PO No further dose adjustments anticipated- pharmacy to sign off.  Please re-consult if needed.   Height: 5\' 1"  (154.9 cm) Weight: 121 lb 11.1 oz (55.2 kg) IBW/kg (Calculated) : 47.8  Temp (24hrs), Avg:97.9 F (36.6 C), Min:97.5 F (36.4 C), Max:98.4 F (36.9 C)  Recent Labs  Lab 07/16/18 0101 07/16/18 0126 07/16/18 0734 07/16/18 1054 07/17/18 0321 07/18/18 0343  WBC  --  18.2*  --   --  11.8* 9.2  CREATININE  --  0.88  --   --  0.68  --   LATICACIDVEN 0.8  --  0.9 1.9  --   --     Estimated Creatinine Clearance: 65.6 mL/min (by C-G formula based on SCr of 0.68 mg/dL).    No Known Allergies  Thank you for allowing pharmacy to be a part of this patient's care.  07/20/18 07/18/2018 9:05 AM

## 2018-07-19 DIAGNOSIS — M069 Rheumatoid arthritis, unspecified: Secondary | ICD-10-CM

## 2018-07-19 LAB — BASIC METABOLIC PANEL
Anion gap: 5 (ref 5–15)
BUN: 10 mg/dL (ref 6–20)
CO2: 24 mmol/L (ref 22–32)
Calcium: 8 mg/dL — ABNORMAL LOW (ref 8.9–10.3)
Chloride: 109 mmol/L (ref 98–111)
Creatinine, Ser: 0.74 mg/dL (ref 0.44–1.00)
GFR calc Af Amer: >60 mL/min (ref >60)
GFR calc non Af Amer: >60 mL/min (ref >60)
Glucose, Bld: 84 mg/dL (ref 70–99)
POTASSIUM: 4.5 mmol/L (ref 3.5–5.1)
Sodium: 138 mmol/L (ref 135–145)

## 2018-07-19 MED ORDER — GUAIFENESIN ER 600 MG PO TB12
600.0000 mg | ORAL_TABLET | Freq: Two times a day (BID) | ORAL | Status: DC
  Administered 2018-07-19 (×2): 600 mg via ORAL
  Filled 2018-07-19 (×2): qty 1

## 2018-07-19 NOTE — Progress Notes (Signed)
  PROGRESS NOTE  Sheri Hall [id number]:[id number] DOB: 1970-08-28 DOA: 07/16/2018 PCP: Norm Salt, PA  Brief Narrative: 47 year old woman PMH cocaine abuse, rheumatoid arthritis presented with right upper back pain, productive cough, recent cocaine use.  Tachycardic, hypotensive with leukocytosis, chest x-ray showed right upper lobe infiltrate.  Admitted for sepsis secondary to pneumonia.  Sepsis gradually resolved, hospitalization prolonged by significant increased respiratory effort, follow-up CT demonstrated severe pneumonia, as well as bilateral pleural effusions and pulmonary nodules.  Now starting to improve, anticipate discharge next 48 hours on oral antibiotics.  Assessment/Plan Sepsis secondary to right upper lobe pneumonia.  Influenza negative.  Chest x-ray hazy right upper lobe infiltrate.  Some concern for aspiration was noted and therefore patient was changed to Unasyn.  Has been monitored in stepdown for borderline low blood pressures. --Sepsis resolved.  No hypoxia. --Respiratory effort has decreased. --Respiratory culture normal flora.  Blood cultures no growth to date. --CT chest negative for PE, did show bilateral pleural effusions right greater than left and dense consolidation right upper lobe and right lower lobe consistent with infectious process.  Small pulmonary nodules again seen, suspect related to rheumatoid arthritis or granulomatous disease.  Noncontrast CT is recommended 3-6 months.  Normocytic anemia, may be below baseline --Hemoglobin stable.  Follow-up as an outpatient.  Likely secondary to menses.  Cocaine use, drug abuse --Recommend cessation.  Rheumatoid arthritis, not followed currently by rheumatology, has not used methotrexate for more than 3 years --Follow-up with rheumatology as an outpatient.  Depression, anxiety, not on any outpatient meds --Will need outpatient follow-up   Improving.  Transfer him to medical floor.  If continues to improve  with antibiotics, likely home 12/31.  DVT prophylaxis: enoxaparin Code Status: Full Family Communication:  Disposition Plan: home    Brendia Sacks, MD  [facility] Direct contact: [phone] --Via amion app OR  --[email or web address]; password [email or web address]  7PM-7AM contact night coverage as above 07/19/2018, 1:23 PM  LOS: 3 days   Consultants:    Procedures:    Antimicrobials:  Unasyn 12/29 >  Ceftriaxone 12/27  Zithromax 12/27 >  Interval history/Subjective: Feels a lot better today.  Breathing better.  Objective: Vitals:  Vitals:   07/19/18 1100 07/19/18 1200  BP:    Pulse: 78   Resp: (!) 23   Temp:  98.3 F (36.8 C)  SpO2: 97%     Exam:  Constitutional:   . Appears calm and comfortable Respiratory:  . CTA bilaterally, no w/r/r.  . Respiratory effort mildly increased. Cardiovascular:  . RRR, no m/r/g . No LE extremity edema   Psychiatric:  . Mental status o Mood, affect appropriate  I have personally reviewed the following:   Data: . CT noted . BMP unremarkable  Scheduled Meds: . azithromycin  500 mg Oral QHS  . guaiFENesin  600 mg Oral BID  . pantoprazole  40 mg Oral Daily  . polyethylene glycol  17 g Oral BID  . senna  1 tablet Oral QHS  . zolpidem  5 mg Oral Once   Continuous Infusions: . ampicillin-sulbactam (UNASYN) IV Stopped (07/19/18 1023)    Principal Problem:   Sepsis (HCC) Active Problems:   Lobar pneumonia (HCC)   Normocytic anemia   Cocaine use   Rheumatoid arthritis (HCC)   LOS: 3 days

## 2018-07-20 DIAGNOSIS — A419 Sepsis, unspecified organism: Principal | ICD-10-CM

## 2018-07-20 DIAGNOSIS — R6520 Severe sepsis without septic shock: Secondary | ICD-10-CM

## 2018-07-20 MED ORDER — AMOXICILLIN-POT CLAVULANATE 875-125 MG PO TABS: 1.0000 | ORAL_TABLET | Freq: Two times a day (BID) | ORAL | 0 refills | Status: AC

## 2018-07-20 MED ORDER — ACETAMINOPHEN 325 MG PO TABS: 650.0000 mg | ORAL_TABLET | Freq: Four times a day (QID) | ORAL | Status: DC | PRN

## 2018-07-20 MED ORDER — GUAIFENESIN ER 600 MG PO TB12: 600.0000 mg | ORAL_TABLET | Freq: Two times a day (BID) | ORAL | 0 refills | Status: DC

## 2018-07-20 NOTE — Progress Notes (Signed)
RN reviewed discharge instructions with patient.   Patient informed that medications had been called into Pharmacy, and gave a schedule for when to take the rest of her antibiotics.   All other questions answered.   NT rolled patient down with all belongings to family car.

## 2018-07-20 NOTE — Discharge Summary (Signed)
Physician Discharge Summary  Sheri Hall ELF:[id number] DOB: 07-19-1971 DOA: 07/16/2018  PCP: Norm Salt, PA  Admit date: 07/16/2018 Discharge date: 07/20/2018  Time spent: 25 minutes  Recommendations for Outpatient Follow-up:  1. Needs to complete 2 more days of Augmentin 2. Needs outpatient CT scan of chest 3. Recommend PCP follow-up and dose adjust various medications that she is not taking including multiple antipsychotics and antidepressants  Discharge Diagnoses:  Principal Problem:   Sepsis (HCC) Active Problems:   Lobar pneumonia (HCC)   Normocytic anemia   Cocaine use   Rheumatoid arthritis (HCC)   Discharge Condition: Improved  Diet recommendation: Regular  Filed Weights   07/16/18 0910  Weight: 55.2 kg    History of present illness:  47 year old female with cocaine use up to a month ago Rheumatoid arthritis with upper back pain and found to have right upper lobe infiltrate was admitted for sepsis and she resolved nicely CT scan demonstrated severe pneumonia and effusions   Hospital Course:  Sepsis secondary to right upper lobe pneumonia-medications were adjusted and changed from azithromycin to Unasyn and subsequently Augmentin on discharge respiratory cultures were negative influenza was negative-  Pulmonary nodules needs outpatient noncontrast CT scan  Anemia normocytic without acute component-stable likely physiologic  Cocaine abuse congratulated on cessation for over a month ago Needs outpatient follow-up  Rheumatoid arthritis needs outpatient rheumatology follow-up  Bipolar-not on any meds as an outpatient will need follow-up in the outpatient setting and dosage of the same     Discharge Exam: Vitals:   07/20/18 0416 07/20/18 0506  BP:  (!) 146/90  Pulse:  74  Resp:  16  Temp:  98.5 F (36.9 C)  SpO2: 94% 98%    General: Awake alert pleasant no distress EOMI NCAT neck soft supple Cardiovascular: S1-S2 no murmur rub or  gallop Respiratory: Clinically clear no rales no rhonchi Abdomen soft nontender no rebound No lower extremity edema Range of motion intact  Discharge Instructions   Discharge Instructions    Diet - low sodium heart healthy   Complete by:  As directed    Discharge instructions   Complete by:  As directed    It is recommended that you get an outpatient follow-up with your regular doctor and get labs in about 1 to 2 weeks Please complete the remainder of antibiotics Augmentin for 2 more days You will need a CT scan of the chest probably in about 6 to 8 weeks to confirm clearing of the lung nodules given your smoking history Congratulations on cessation and continue the good efforts Happy new year   Increase activity slowly   Complete by:  As directed      Allergies as of 07/20/2018   No Known Allergies     Medication List    STOP taking these medications   ARIPiprazole 10 MG tablet Commonly known as:  ABILIFY   hydrOXYzine 50 MG tablet Commonly known as:  ATARAX/VISTARIL   prazosin 2 MG capsule Commonly known as:  MINIPRESS   traZODone 50 MG tablet Commonly known as:  DESYREL   venlafaxine XR 75 MG 24 hr capsule Commonly known as:  EFFEXOR-XR     TAKE these medications   acetaminophen 325 MG tablet Commonly known as:  TYLENOL Take 2 tablets (650 mg total) by mouth every 6 (six) hours as needed for mild pain (or Fever >/= 101).   amoxicillin-clavulanate 875-125 MG tablet Commonly known as:  AUGMENTIN Take 1 tablet by mouth every 12 (twelve) hours  for 4 doses.   guaiFENesin 600 MG 12 hr tablet Commonly known as:  MUCINEX Take 1 tablet (600 mg total) by mouth 2 (two) times daily.      No Known Allergies    The results of significant diagnostics from this hospitalization (including imaging, microbiology, ancillary and laboratory) are listed below for reference.    Significant Diagnostic Studies: Dg Chest 2 View  Result Date: 07/15/2018 CLINICAL DATA:   Initial evaluation for acute back pain and cough. EXAM: CHEST - 2 VIEW COMPARISON:  Prior CT from 01/11/2017. FINDINGS: Cardiac and mediastinal silhouettes are within normal limits. Lungs are mildly hypoinflated. Parenchymal infiltrate within the right upper lobe, concerning for pneumonia. Mild scattered bibasilar atelectatic changes. No pulmonary edema or pleural effusion. No pneumothorax. No acute osseous abnormality. IMPRESSION: Hazy right upper lobe infiltrate, concerning for pneumonia. Electronically Signed   By: Rise Mu M.D.   On: 07/15/2018 23:37   Ct Angio Chest Pe W Or Wo Contrast  Addendum Date: 07/18/2018   ADDENDUM REPORT: 07/18/2018 12:14 ADDENDUM: Per discussion with Dr. Irene Limbo, the patient has no symptoms referable to the abdomen or bowel. The appearance of small bowel loops in the LEFT UPPER QUADRANT may be related to early contrast bolus timing over true pathology. Electronically Signed   By: Norva Pavlov M.D.   On: 07/18/2018 12:14   Result Date: 07/18/2018 CLINICAL DATA:  46 year old woman PMH cocaine abuse, rheumatoid arthritis presented with right upper back pain, productive cough, recent cocaine use. Tachycardic, hypotensive with leukocytosis, chest x-ray showed right upper lobe infiltrate. Tachycardia, hypertension with leukocytosis. RIGHT UPPER lobe infiltrate on chest x-ray. Admitted for sepsis. EXAM: CT ANGIOGRAPHY CHEST WITH CONTRAST TECHNIQUE: Multidetector CT imaging of the chest was performed using the standard protocol during bolus administration of intravenous contrast. Multiplanar CT image reconstructions and MIPs were obtained to evaluate the vascular anatomy. CONTRAST:  ISOVUE-370 IOPAMIDOL (ISOVUE-370) INJECTION 76% COMPARISON:  07/15/2018, 01/11/2017 FINDINGS: Cardiovascular: Satisfactory opacification of the pulmonary arteries to the segmental level. No evidence of pulmonary embolism. Normal heart size. No pericardial effusion. Heart size is  normal. No significant atherosclerotic calcification of the coronary arteries. No pericardial effusion. Thoracic aorta is unremarkable. Mediastinum/Nodes: The visualized portion of the thyroid gland has a normal appearance. Prevascular lymph node is 1.3 centimeters. RIGHT paratracheal lymph node is 1.3 centimeters. AP window lymph node is 1.3 centimeters. Esophagus is normal in appearance. Lungs/Pleura: There are bilateral pleural effusions, RIGHT greater than LEFT. There is dense consolidation within the RIGHT UPPER lobe and RIGHT LOWER lobe, consistent with infectious process. Reticulonodular opacity is identified within the RIGHT middle lobe, consistent with inflammatory/infectious etiology. Within the RIGHT LOWER lobe there is a 6 millimeter nodule on image 70/stents. A 3 millimeter nodule slightly LOWER in the RIGHT LOWER lobe is best seen on image 73/stents. There is a 6 millimeter nodule in the LEFT LOWER lobe on image 90 sat/6. Geographic, mosaic appearance of the lungs is consistent with small airways disease and air trapping. Upper Abdomen: Suspect thickening of the LEFT UPPER QUADRANT small bowel loops, only partially imaged. Musculoskeletal: Remote superior endplate fracture of T3 vertebral body. No acute or suspicious osseous lesions. Review of the MIP images confirms the above findings. IMPRESSION: 1. Technically adequate exam showing no acute pulmonary embolus. 2. Bilateral pleural effusions, RIGHT greater than LEFT. 3. Dense consolidation within the RIGHT UPPER lobe and RIGHT LOWER lobe, consistent with infectious process. 4. Reticulonodular opacities within the RIGHT middle lobe, consistent with inflammatory/infectious process. 5. Small  pulmonary nodules measuring up to 6 millimeters. Considerations include emboli, metastatic disease, granulomatous disease, or small rheumatoid nodules. Non-contrast chest CT at 3-6 months is recommended. If the nodules are stable at time of repeat CT, then future CT  at 18-24 months (from today's scan) is considered optional for low-risk patients, but is recommended for high-risk patients. This recommendation follows the consensus statement: Guidelines for Management of Incidental Pulmonary Nodules Detected on CT Images: From the Fleischner Society 2017; Radiology 2017; 284:228-243. 6. Mediastinal adenopathy, likely reactive. 7. Suspect thickening of the LEFT UPPER QUADRANT small bowel loops, only partially imaged. Consider CT of the abdomen and pelvis with intravenous and oral contrast. 8. Remote superior endplate fracture of T3. Electronically Signed: By: Norva Pavlov M.D. On: 07/18/2018 11:29    Microbiology: Recent Results (from the past 240 hour(s))  Blood culture (routine x 2)     Status: None (Preliminary result)   Collection Time: 07/16/18  1:26 AM  Result Value Ref Range Status   Specimen Description   Final    BLOOD RIGHT ANTECUBITAL Performed at [facility], [address], [place], [place] [address]    Special Requests   Final    BOTTLES DRAWN AEROBIC AND ANAEROBIC Blood Culture results may not be optimal due to an excessive volume of blood received in culture bottles Performed at [facility], [address], [place], [place] [address]    Culture   Final    NO GROWTH 4 DAYS Performed at [facility], [address], [place], [place] [address]    Report Status PENDING  Incomplete  Blood culture (routine x 2)     Status: None (Preliminary result)   Collection Time: 07/16/18  1:28 AM  Result Value Ref Range Status   Specimen Description   Final    BLOOD LEFT ANTECUBITAL Performed at [facility], [address] [place], [place] [address]    Special Requests   Final    BOTTLES DRAWN AEROBIC AND ANAEROBIC Blood Culture adequate volume Performed at [facility], [address], [place], [place] [address]    Culture   Final    NO GROWTH 4 DAYS Performed  at [facility], [address] [place], [place] [address]    Report Status PENDING  Incomplete  Urine culture     Status: Abnormal   Collection Time: 07/16/18  4:23 AM  Result Value Ref Range Status   Specimen Description   Final    URINE, CLEAN CATCH Performed at [facility], [address], [place], [place] [address]    Special Requests   Final    NONE Performed at [facility], [address] [place], [place] [address]    Culture (A)  Final    <10,000 COLONIES/mL INSIGNIFICANT GROWTH Performed at [facility], [address], [place], [place] [address]    Report Status 07/17/2018 FINAL  Final  Culture, sputum-assessment     Status: None   Collection Time: 07/16/18  5:58 AM  Result Value Ref Range Status   Specimen Description SPUTUM  Final   Special Requests NONE  Final   Sputum evaluation   Final    THIS SPECIMEN IS ACCEPTABLE FOR SPUTUM CULTURE Performed at [facility], [address], [place], [place] [address]    Report Status 07/16/2018 FINAL  Final  Culture, respiratory     Status: None   Collection Time:  07/16/18  5:58 AM  Result Value Ref Range Status   Specimen Description   Final    SPUTUM Performed at [facility], [address], [place], [place] [address]    Special Requests   Final    NONE Reflexed from [id number] Performed at [facility], [address] [place], [place] [address]    Gram Stain   Final    MODERATE WBC PRESENT, PREDOMINANTLY PMN RARE SQUAMOUS EPITHELIAL CELLS PRESENT FEW GRAM POSITIVE COCCI RARE GRAM NEGATIVE RODS    Culture   Final    FEW Consistent with normal respiratory flora. Performed at [facility], [address] [place], [place] [address]    Report Status 07/18/2018 FINAL  Final  MRSA PCR Screening     Status: None   Collection Time: 07/16/18 10:19 AM  Result Value Ref Range Status   MRSA by PCR NEGATIVE  NEGATIVE Final    Comment:        The GeneXpert MRSA Assay (FDA approved for NASAL specimens only), is one component of a comprehensive MRSA colonization surveillance program. It is not intended to diagnose MRSA infection nor to guide or monitor treatment for MRSA infections. Performed at [facility], [address] [place], [place] [address]      Labs: Basic Metabolic Panel: Recent Labs  Lab 07/16/18 0126 07/17/18 0321 07/19/18 0330  NA 133* 137 138  K 3.7 3.6 4.5  CL 99 111 109  CO2 25 21* 24  GLUCOSE 97 116* 84  BUN 13 10 10   CREATININE 0.88 0.68 0.74  CALCIUM 8.4* 7.3* 8.0*  MG  --  1.9  --    Liver Function Tests: Recent Labs  Lab 07/16/18 0734  AST 41  ALT 38  ALKPHOS 48  BILITOT 0.3  PROT 7.2  ALBUMIN 2.2*   No results for input(s): LIPASE, AMYLASE in the last 168 hours. No results for input(s): AMMONIA in the last 168 hours. CBC: Recent Labs  Lab 07/16/18 0126 07/17/18 0321 07/18/18 0343  WBC 18.2* 11.8* 9.2  NEUTROABS 14.1* 7.9*  --   HGB 12.5 9.2* 9.6*  HCT 39.5 30.3* 31.5*  MCV 88.2 89.6 90.8  PLT 289 222 236   Cardiac Enzymes: Recent Labs  Lab 07/16/18 0734  TROPONINI <0.03   BNP: BNP (last 3 results) No results for input(s): BNP in the last 8760 hours.  ProBNP (last 3 results) No results for input(s): PROBNP in the last 8760 hours.  CBG: No results for input(s): GLUCAP in the last 168 hours.     Signed:  Rhetta Mura MD   [facility] 07/20/2018, 8:55 AM

## 2018-07-21 LAB — CULTURE, BLOOD (ROUTINE X 2)
CULTURE: NO GROWTH
Culture: NO GROWTH
SPECIAL REQUESTS: ADEQUATE

## 2019-12-27 ENCOUNTER — Emergency Department (HOSPITAL_COMMUNITY)
Admission: EM | Admit: 2019-12-27 | Discharge: 2019-12-28 | Disposition: A | Discharge status: Other Acute Inpatient Hospital | Payer: Medicaid Other | Attending: Emergency Medicine | Admitting: Emergency Medicine

## 2019-12-27 ENCOUNTER — Encounter (HOSPITAL_COMMUNITY): Payer: Self-pay | Admitting: Emergency Medicine

## 2019-12-27 ENCOUNTER — Other Ambulatory Visit: Payer: Self-pay

## 2019-12-27 DIAGNOSIS — F17210 Nicotine dependence, cigarettes, uncomplicated: Secondary | ICD-10-CM | POA: Insufficient documentation

## 2019-12-27 DIAGNOSIS — J45909 Unspecified asthma, uncomplicated: Secondary | ICD-10-CM | POA: Insufficient documentation

## 2019-12-27 DIAGNOSIS — Z20822 Contact with and (suspected) exposure to covid-19: Secondary | ICD-10-CM | POA: Insufficient documentation

## 2019-12-27 DIAGNOSIS — R45851 Suicidal ideations: Secondary | ICD-10-CM | POA: Insufficient documentation

## 2019-12-27 DIAGNOSIS — F1494 Cocaine use, unspecified with cocaine-induced mood disorder: Secondary | ICD-10-CM | POA: Insufficient documentation

## 2019-12-27 LAB — CBC WITH DIFFERENTIAL/PLATELET
Abs Immature Granulocytes: 0.01 10*3/uL (ref 0.00–0.07)
Basophils Absolute: 0 10*3/uL (ref 0.0–0.1)
Basophils Relative: 0 %
Eosinophils Absolute: 0.2 10*3/uL (ref 0.0–0.5)
Eosinophils Relative: 3 %
HCT: 35 % — ABNORMAL LOW (ref 36.0–46.0)
Hemoglobin: 11 g/dL — ABNORMAL LOW (ref 12.0–15.0)
Immature Granulocytes: 0 %
Lymphocytes Relative: 64 %
Lymphs Abs: 3.2 10*3/uL (ref 0.7–4.0)
MCH: 28.2 pg (ref 26.0–34.0)
MCHC: 31.4 g/dL (ref 30.0–36.0)
MCV: 89.7 fL (ref 80.0–100.0)
Monocytes Absolute: 0.3 10*3/uL (ref 0.1–1.0)
Monocytes Relative: 6 %
Neutro Abs: 1.3 10*3/uL — ABNORMAL LOW (ref 1.7–7.7)
Neutrophils Relative %: 27 %
Platelets: 300 10*3/uL (ref 150–400)
RBC: 3.9 MIL/uL (ref 3.87–5.11)
RDW: 16.9 % — ABNORMAL HIGH (ref 11.5–15.5)
WBC: 5 10*3/uL (ref 4.0–10.5)
nRBC: 0 % (ref 0.0–0.2)

## 2019-12-27 LAB — COMPREHENSIVE METABOLIC PANEL
ALT: 23 U/L (ref 0–44)
AST: 37 U/L (ref 15–41)
Albumin: 2.3 g/dL — ABNORMAL LOW (ref 3.5–5.0)
Alkaline Phosphatase: 60 U/L (ref 38–126)
Anion gap: 5 (ref 5–15)
BUN: 14 mg/dL (ref 6–20)
CO2: 27 mmol/L (ref 22–32)
Calcium: 8 mg/dL — ABNORMAL LOW (ref 8.9–10.3)
Chloride: 109 mmol/L (ref 98–111)
Creatinine, Ser: 0.79 mg/dL (ref 0.44–1.00)
GFR calc Af Amer: >60 mL/min (ref >60)
GFR calc non Af Amer: >60 mL/min (ref >60)
Glucose, Bld: 84 mg/dL (ref 70–99)
Potassium: 3.6 mmol/L (ref 3.5–5.1)
Sodium: 141 mmol/L (ref 135–145)
Total Bilirubin: 0.4 mg/dL (ref 0.3–1.2)
Total Protein: 8.8 g/dL — ABNORMAL HIGH (ref 6.5–8.1)

## 2019-12-27 LAB — SARS CORONAVIRUS 2 BY RT PCR (HOSPITAL ORDER, PERFORMED IN ~~LOC~~ HOSPITAL LAB): SARS Coronavirus 2: NEGATIVE

## 2019-12-27 LAB — I-STAT BETA HCG BLOOD, ED (MC, WL, AP ONLY): I-stat hCG, quantitative: <5 m[IU]/mL (ref <5)

## 2019-12-27 LAB — ETHANOL: Alcohol, Ethyl (B): <10 mg/dL (ref <10)

## 2019-12-27 MED ORDER — HYDROXYZINE HCL 10 MG PO TABS
25.0000 mg | ORAL_TABLET | Freq: Once | ORAL | Status: AC
  Administered 2019-12-27: 25 mg via ORAL
  Filled 2019-12-27: qty 3

## 2019-12-27 MED ORDER — LORAZEPAM 1 MG PO TABS
1.0000 mg | ORAL_TABLET | Freq: Once | ORAL | Status: AC
  Administered 2019-12-27: 1 mg via ORAL
  Filled 2019-12-27: qty 1

## 2019-12-27 NOTE — ED Notes (Signed)
Pt come out to triage desk asking how long it will be before the ativan will help calm her down. Pt states that it is not working. Patient requesting something else. Message Bero EDO making aware. Awaiting new orders.

## 2019-12-27 NOTE — ED Triage Notes (Signed)
Pt having SI without a plan. Left leg pains for about about a week.
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
## 2019-12-27 NOTE — ED Notes (Signed)
Pt has one patient belonging bag and blue with gold strap bag in triage at nurses station

## 2019-12-27 NOTE — ED Provider Notes (Signed)
[facility]ENCY DEPT [facility] Emergency Department Provider Note MRN:  [id number]  Arrival date & time: 12/27/19     Chief Complaint   Suicidal and Leg Pain   History of Present Illness   Sheri Hall is a 49 y.o. year-old female with a history of bipolar disorder, crack cocaine use disorder presenting to the ED with chief complaint of suicidal ideation and leg pain.  Patient explains that she had been clean and off drugs for 2 years but relapsed a few days ago.  Used crack.  Only smokes, has never used IV drugs.  Feeling very hopeless and ashamed, explaining that she needs help to get clean again.  Had been considering suicide but without a specific plan in mind.  Denies headache or vision change, no chest pain or shortness of breath, no abdominal pain, no numbness or weakness.  Has been having some left thigh discomfort over the past week, denies trauma, describes it as just a discomfort and sometimes her leg "gives out" when she is walking.  Review of Systems  A complete 10 system review of systems was obtained and all systems are negative except as noted in the HPI and PMH.   Patient's Health History    Past Medical History:  Diagnosis Date  . Anxiety   . Arthritis    rheumatoid   . Asthma    seasonal  . Bipolar disorder (HCC)   . Depression   . GERD (gastroesophageal reflux disease)   . OCD (obsessive compulsive disorder)   . PTSD (post-traumatic stress disorder)     Past Surgical History:  Procedure Laterality Date  . CESAREAN SECTION    . OPEN REDUCTION INTERNAL FIXATION (ORIF) DISTAL RADIAL FRACTURE Left 10/15/2015   Procedure: OPEN TREATMENT OF LEFT DISTAL RADIUS FRACTURE;  Surgeon: Mack Hook, MD;  Location: [facility];  Service: Orthopedics;  Laterality: Left;    Family History  Family history unknown: Yes    Social History   Socioeconomic History  . Marital status: Single    Spouse name: Not on file  . Number of children: Not on  file  . Years of education: Not on file  . Highest education level: Not on file  Occupational History  . Not on file  Tobacco Use  . Smoking status: Current Every Day Smoker    Packs/day: 0.50    Types: Cigarettes  . Smokeless tobacco: Never Used  Substance and Sexual Activity  . Alcohol use: Yes    Comment: daily  . Drug use: Yes    Types: Cocaine    Comment: wednesday March 22nd 2017  . Sexual activity: Yes    Birth control/protection: I.U.D.    Comment: Mirena  Other Topics Concern  . Not on file  Social History Narrative  . Not on file   Social Determinants of Health   Financial Resource Strain:   . Difficulty of Paying Living Expenses:   Food Insecurity:   . Worried About Programme researcher, broadcasting/film/video in the Last Year:   . Barista in the Last Year:   Transportation Needs:   . Freight forwarder (Medical):   Marland Kitchen Lack of Transportation (Non-Medical):   Physical Activity:   . Days of Exercise per Week:   . Minutes of Exercise per Session:   Stress:   . Feeling of Stress :   Social Connections:   . Frequency of Communication with Friends and Family:   . Frequency of Social Gatherings with Friends and  Family:   . Attends Religious Services:   . Active Member of Clubs or Organizations:   . Attends Banker Meetings:   Marland Kitchen Marital Status:   Intimate Partner Violence:   . Fear of Current or Ex-Partner:   . Emotionally Abused:   Marland Kitchen Physically Abused:   . Sexually Abused:      Physical Exam   Vitals:   12/27/19 1616  BP: (!) 149/108  Pulse: (!) 102  Resp: 18  Temp: 98.9 F (37.2 C)  SpO2: 99%    CONSTITUTIONAL: Well-appearing, NAD NEURO:  Alert and oriented x 3, no focal deficits EYES:  eyes equal and reactive ENT/NECK:  no LAD, no JVD CARDIO: Regular rate, well-perfused, normal S1 and S2 PULM:  CTAB no wheezing or rhonchi GI/GU:  normal bowel sounds, non-distended, non-tender MSK/SPINE:  No gross deformities, no edema SKIN:  no rash,  atraumatic PSYCH: Depressed and slightly anxious speech and behavior, tearful  *Additional and/or pertinent findings included in MDM below  Diagnostic and Interventional Summary    EKG Interpretation  Date/Time:  Tuesday December 27 2019 17:17:36 EDT Ventricular Rate:  79 PR Interval:    QRS Duration: 83 QT Interval:  410 QTC Calculation: 470 R Axis:   53 Text Interpretation: Sinus rhythm Abnormal T, consider ischemia, diffuse leads 12 Lead; Mason-Likar Confirmed by Kennis Carina [phone]) on 12/27/2019 9:50:32 PM      Labs Reviewed  COMPREHENSIVE METABOLIC PANEL - Abnormal; Notable for the following components:      Result Value   Calcium 8.0 (*)    Total Protein 8.8 (*)    Albumin 2.3 (*)    All other components within normal limits  CBC WITH DIFFERENTIAL/PLATELET - Abnormal; Notable for the following components:   Hemoglobin 11.0 (*)    HCT 35.0 (*)    RDW 16.9 (*)    Neutro Abs 1.3 (*)    All other components within normal limits  SARS CORONAVIRUS 2 BY RT PCR (HOSPITAL ORDER, PERFORMED IN [facility])  ETHANOL  RAPID URINE DRUG SCREEN, HOSP PERFORMED  I-STAT BETA HCG BLOOD, ED ([facility], [facility], [facility] ONLY)    No orders to display    Medications  LORazepam (ATIVAN) tablet 1 mg (1 mg Oral Given 12/27/19 1712)  hydrOXYzine (ATARAX/VISTARIL) tablet 25 mg (25 mg Oral Given 12/27/19 2022)     Procedures  /  Critical Care Procedures  ED Course and Medical Decision Making  I have reviewed the triage vital signs, the nursing notes, and pertinent available records from the EMR.  Listed above are laboratory and imaging tests that I personally ordered, reviewed, and interpreted and then considered in my medical decision making (see below for details).      There is no asymmetry to the legs, no erythema, no edema, no tenderness to palpation, neurovascularly intact, nothing to suggest DVT or infection.  Suspect musculoskeletal etiology of patient's pain, without recent trauma there  is no indication for imaging or further testing today.  Patient would benefit from TTS evaluation given her suicidality in the setting of crack use disorder.  Work-up is overall reassuring, EKG with nonspecific changes compared to multiple years ago.  No chest pain.  Patient is medically cleared, psychiatry recommending inpatient instrument.  Signed out to default provider.  Elmer Sow. Pilar Plate, MD [facility] Emergency Medicine [facility] [email or web address]  Final Clinical Impressions(s) / ED Diagnoses     ICD-10-CM   1. Suicidal ideation  R45.851  ED Discharge Orders    None       Discharge Instructions Discussed with and Provided to Patient:   Discharge Instructions   None       Maudie Flakes, MD 12/27/19 2241

## 2019-12-28 ENCOUNTER — Inpatient Hospital Stay (HOSPITAL_COMMUNITY)
Admission: AD | Admit: 2019-12-28 | Discharge: 2020-01-04 | DRG: 885 | Disposition: A | Discharge status: Home / Self Care | Payer: Medicaid Other | Source: Intra-hospital | Attending: Psychiatry | Admitting: Psychiatry

## 2019-12-28 ENCOUNTER — Encounter (HOSPITAL_COMMUNITY): Payer: Self-pay | Admitting: Psychiatry

## 2019-12-28 ENCOUNTER — Encounter (HOSPITAL_COMMUNITY): Payer: Self-pay | Admitting: Registered Nurse

## 2019-12-28 DIAGNOSIS — K219 Gastro-esophageal reflux disease without esophagitis: Secondary | ICD-10-CM | POA: Diagnosis present

## 2019-12-28 DIAGNOSIS — F339 Major depressive disorder, recurrent, unspecified: Secondary | ICD-10-CM | POA: Diagnosis present

## 2019-12-28 DIAGNOSIS — F429 Obsessive-compulsive disorder, unspecified: Secondary | ICD-10-CM | POA: Diagnosis present

## 2019-12-28 DIAGNOSIS — R45851 Suicidal ideations: Secondary | ICD-10-CM | POA: Diagnosis present

## 2019-12-28 DIAGNOSIS — F4310 Post-traumatic stress disorder, unspecified: Secondary | ICD-10-CM | POA: Diagnosis present

## 2019-12-28 DIAGNOSIS — M069 Rheumatoid arthritis, unspecified: Secondary | ICD-10-CM | POA: Diagnosis present

## 2019-12-28 DIAGNOSIS — Z62810 Personal history of physical and sexual abuse in childhood: Secondary | ICD-10-CM | POA: Diagnosis present

## 2019-12-28 DIAGNOSIS — F3160 Bipolar disorder, current episode mixed, unspecified: Secondary | ICD-10-CM | POA: Diagnosis present

## 2019-12-28 LAB — RAPID URINE DRUG SCREEN, HOSP PERFORMED
Amphetamines: NOT DETECTED
Barbiturates: NOT DETECTED
Benzodiazepines: NOT DETECTED
Cocaine: POSITIVE — AB
Opiates: NOT DETECTED
Tetrahydrocannabinol: NOT DETECTED

## 2019-12-28 MED ORDER — TRAZODONE HCL 100 MG PO TABS: 100.0000 mg | ORAL_TABLET | Freq: Every evening | ORAL | Status: DC | PRN

## 2019-12-28 MED ORDER — ALUM & MAG HYDROXIDE-SIMETH 200-200-20 MG/5ML PO SUSP: 30.0000 mL | ORAL | Status: DC | PRN

## 2019-12-28 MED ORDER — NICOTINE 21 MG/24HR TD PT24
21.0000 mg | MEDICATED_PATCH | Freq: Every day | TRANSDERMAL | Status: DC
  Filled 2019-12-28 (×3): qty 1

## 2019-12-28 MED ORDER — IBUPROFEN 600 MG PO TABS
600.0000 mg | ORAL_TABLET | Freq: Once | ORAL | Status: DC
  Filled 2019-12-28 (×2): qty 1

## 2019-12-28 MED ORDER — ACETAMINOPHEN 325 MG PO TABS
650.0000 mg | ORAL_TABLET | Freq: Four times a day (QID) | ORAL | Status: DC | PRN
  Administered 2019-12-28: 650 mg via ORAL
  Filled 2019-12-28: qty 2

## 2019-12-28 MED ORDER — HYDROXYZINE HCL 25 MG PO TABS
25.0000 mg | ORAL_TABLET | Freq: Four times a day (QID) | ORAL | Status: DC | PRN
  Administered 2019-12-29: 25 mg via ORAL
  Filled 2019-12-28 (×4): qty 1

## 2019-12-28 MED ORDER — MAGNESIUM HYDROXIDE 400 MG/5ML PO SUSP: 30.0000 mL | Freq: Every day | ORAL | Status: DC | PRN

## 2019-12-28 MED ORDER — ACETAMINOPHEN 325 MG PO TABS
650.0000 mg | ORAL_TABLET | Freq: Once | ORAL | Status: AC
  Administered 2019-12-28: 650 mg via ORAL
  Filled 2019-12-28: qty 2

## 2019-12-28 NOTE — ED Notes (Signed)
This writer attempted to update this patients vital signs. This patient refused vital signs. This writer attempted to put blood pressure cuff on patients off and patient jerked her arm back. Pt began saying over and over " I need to get myself together, I need to get myself together. Leave me alone"       Riki Rusk, RN notified

## 2019-12-28 NOTE — Consult Note (Signed)
°  Spoke to [place], Charity fundraiser (Press photographer)  Patient transported to [facility] prior to discharge orders from [facility] and no admission orders at [facility].  Informed to have EDP or whoever provider that discharged to put in orders.

## 2019-12-28 NOTE — BH Assessment (Signed)
[facility] Assessment Progress Note  Per Shuvon Rankin, NP, this pt requires psychiatric hospitalization at this time.  Percell Boston, RN as assigned pt to [facility] Rm 300-2.  Pt has signed Voluntary Admission and Consent for Treatment, as well as Consent to Release Information to no one, and signed forms have been faxed to [facility].  Pt's nurse, Crystal, has been notified, and agrees to send original paperwork along with pt via [facility], and to call report to [phone].  Doylene Canning, [place] [facility] Coordinator [phone]

## 2019-12-28 NOTE — Tx Team (Signed)
Initial Treatment Plan 12/28/2019 3:35 PM Sheri Hall [id number]    PATIENT STRESSORS: Financial difficulties Loss of two children   PATIENT STRENGTHS: Capable of independent living Wellsite geologist fund of knowledge Motivation for treatment/growth Physical Health Supportive family/friends   PATIENT IDENTIFIED PROBLEMS: "anxiety"  "depression"  "suicide thoughts"  "drug use"               DISCHARGE CRITERIA:  Ability to meet basic life and health needs Adequate post-discharge living arrangements Improved stabilization in mood, thinking, and/or behavior Medical problems require only outpatient monitoring Motivation to continue treatment in a less acute level of care Need for constant or close observation no longer present Reduction of life-threatening or endangering symptoms to within safe limits Safe-care adequate arrangements made Verbal commitment to aftercare and medication compliance Withdrawal symptoms are absent or subacute and managed without 24-hour nursing intervention  PRELIMINARY DISCHARGE PLAN: Attend aftercare/continuing care group Attend PHP/IOP Attend 12-step recovery group Outpatient therapy Return to previous living arrangement  PATIENT/FAMILY INVOLVEMENT: This treatment plan has been presented to and reviewed with the patient, Sheri Hall.  The patient and family have been given the opportunity to ask questions and make suggestions.  Quintella Reichert [place], RN 12/28/2019, 3:35 PM

## 2019-12-28 NOTE — ED Notes (Signed)
Safe transport arrived to transport patient. One labeled patient belongings bag and one labeled blue bag transported with patient.
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
## 2019-12-28 NOTE — ED Notes (Signed)
Pt is requesting tylenol for leg pain . Zammit MD aware

## 2019-12-28 NOTE — Progress Notes (Signed)
Psychoeducational Group Note  Date:  12/28/2019 Time:  2118  Group Topic/Focus:  Wrap-Up Group:   The focus of this group is to help patients review their daily goal of treatment and discuss progress on daily workbooks.  Participation Level: Did Not Attend  Participation Quality:  Not Applicable  Affect:  Not Applicable  Cognitive:  Not Applicable  Insight:  Not Applicable  Engagement in Group: Not Applicable  Additional Comments:  The patient did not attend group this evening.   Hazle Coca S 12/28/2019, 9:18 PM

## 2019-12-28 NOTE — [facility] Group Notes (Signed)
LCSW Group Therapy Note  Type of Therapy/Topic: Group Therapy: Six Dimensions of Wellness  Participation Level: Active  Description of Group: This group will address the concept of wellness and the six concepts of wellness: occupational, physical, social, intellectual, spiritual, and emotional. Patients will be encouraged to process areas in their lives that are out of balance and identify reasons for remaining unbalanced. Patients will be encouraged to explore ways to practice healthy habits on a daily basis to attain better physical and mental health outcomes.  Therapeutic Goals: 1. Identify aspects of wellness that they are doing well. 2. Identify aspects of wellness that they would like to improve upon. 3. Identify one action they can take to improve an aspect of wellness in their lives.    Summary of Patient Progress: Sheri Hall was recently admitted to the unit, she joined the second half of group and was active in the discussion. Sheri Hall shared that she feels that she is satisfied with her social wellness, but she wants to focus on her emotional wellness. Patient also shared that the spiritual aspects of wellness are important to her well being.  Therapeutic Modalities: Cognitive Behavioral Therapy Solution-Focused Therapy Relapse Prevention

## 2019-12-28 NOTE — ED Notes (Addendum)
Safe transport called to transport patient. 

## 2019-12-29 DIAGNOSIS — F3160 Bipolar disorder, current episode mixed, unspecified: Principal | ICD-10-CM

## 2019-12-29 MED ORDER — ARIPIPRAZOLE 5 MG PO TABS
5.0000 mg | ORAL_TABLET | Freq: Every day | ORAL | Status: DC
  Administered 2019-12-29 – 2019-12-31 (×3): 5 mg via ORAL
  Filled 2019-12-29 (×6): qty 1

## 2019-12-29 MED ORDER — TRAZODONE HCL 50 MG PO TABS
50.0000 mg | ORAL_TABLET | Freq: Every evening | ORAL | Status: DC | PRN
  Administered 2020-01-03: 50 mg via ORAL
  Filled 2019-12-29 (×3): qty 1

## 2019-12-29 MED ORDER — IBUPROFEN 400 MG PO TABS
400.0000 mg | ORAL_TABLET | Freq: Four times a day (QID) | ORAL | Status: DC | PRN
  Administered 2019-12-29 – 2020-01-03 (×6): 400 mg via ORAL
  Filled 2019-12-29 (×6): qty 1

## 2019-12-29 MED ORDER — VENLAFAXINE HCL ER 37.5 MG PO CP24
37.5000 mg | ORAL_CAPSULE | Freq: Every day | ORAL | Status: DC
  Administered 2019-12-30 – 2019-12-31 (×2): 37.5 mg via ORAL
  Filled 2019-12-29 (×5): qty 1

## 2019-12-29 MED ORDER — PANTOPRAZOLE SODIUM 40 MG PO TBEC
40.0000 mg | DELAYED_RELEASE_TABLET | Freq: Every day | ORAL | Status: DC
  Administered 2019-12-29 – 2020-01-04 (×7): 40 mg via ORAL
  Filled 2019-12-29 (×10): qty 1

## 2019-12-29 NOTE — Plan of Care (Signed)
Patient stayed in bed and came to the dayroom at snack time. Irritable and agitated, guarded and avoiding to discuss about her situation. Patient went to the dayroom, had a snack and requested Ibuprofen: patient fell asleep before Ibuprofen available. Currently in bed sleeping soundly. Safety monitored as expected.
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
## 2019-12-29 NOTE — Plan of Care (Signed)
Progress Note  D: pt found in bed; noncompliant with medication administration. "Why do I have a nicotine patch ordered?! I don't need that!" Pt promptly left the medication window afterward. Pt continues to be angry and agitated on approach. Pt has been reclusive to their room and shows little interest in programming. Pt continues to lack appropriate hygiene and shows little insight into their self neglect. Pt denies si/hi/ah/vh and verbally agrees to approach staff if these become apparent or before harming themself/others while at [facility].  A: Pt provided support and encouragement. Pt given medication per protocol and standing orders. Q5m safety checks implemented and continued.  R: Pt safe on the unit. Will continue to monitor.  Pt progressing in the following metrics  Problem: Education: Goal: Knowledge of [facility] Education information/materials will improve Outcome: Progressing   Problem: Activity: Goal: Sleeping patterns will improve Outcome: Progressing   Problem: Physical Regulation: Goal: Ability to maintain clinical measurements within normal limits will improve Outcome: Progressing

## 2019-12-29 NOTE — Progress Notes (Signed)
Psychoeducational Group Note  Date:  12/29/2019 Time:  2030 Group Topic/Focus:  wrap up group  Participation Level: Did Not Attend  Participation Quality:  Not Applicable  Affect:  Not Applicable  Cognitive:  Not Applicable  Insight:  Not Applicable  Engagement in Group: Not Applicable  Additional Comments:  Pt was notified that group was beginning but remained in room.   Marcille Buffy 12/29/2019, 9:42 PM

## 2019-12-29 NOTE — BHH Counselor (Signed)
CSW attempted to meet with patient to complete PSA and discuss follow up. Patient appeared to be sleeping and did not respond to CSW.  CSW will follow up.  Estefania Kamiya, MSW, LCSW-A Clinical Social Worker [facility] Adult Unit  
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
## 2019-12-29 NOTE — Plan of Care (Signed)
Patient has remained guarded, irritable and rude. Frequently using the phone. Patient get agitated upon approach, arrogant. Pacing, restless. No physical aggressive behaviors. Has not been able to participate in activities with peers. Mild confusion noted.  Has not express any concerns.

## 2019-12-30 MED ORDER — ZIPRASIDONE MESYLATE 20 MG IM SOLR
20.0000 mg | Freq: Once | INTRAMUSCULAR | Status: DC
  Filled 2019-12-30: qty 20

## 2019-12-30 MED ORDER — LORAZEPAM 1 MG PO TABS
2.0000 mg | ORAL_TABLET | Freq: Once | ORAL | Status: AC
  Administered 2019-12-30: 2 mg via ORAL
  Filled 2019-12-30: qty 2

## 2019-12-30 MED ORDER — LORAZEPAM 1 MG PO TABS
ORAL_TABLET | ORAL | Status: AC
  Filled 2019-12-30: qty 2

## 2019-12-30 MED ORDER — HYDROXYZINE HCL 50 MG PO TABS
ORAL_TABLET | ORAL | Status: AC
  Filled 2019-12-30: qty 1

## 2019-12-30 MED ORDER — HYDROXYZINE HCL 50 MG PO TABS
50.0000 mg | ORAL_TABLET | Freq: Once | ORAL | Status: DC
  Filled 2019-12-30: qty 1

## 2019-12-30 NOTE — Progress Notes (Signed)
RN met with pt.  Pt said "I feel like I'm gonna explode."  RN encouraged pt to take prescribed medications.  Pt agreed and took medications. RN will continue to assess and provide support as needed.

## 2019-12-30 NOTE — Progress Notes (Signed)
   12/30/19 2046  COVID-19 Daily Checkoff  Have you had a fever (temp > 37.80C/100F)  in the past 24 hours?  No  If you have had runny nose, nasal congestion, sneezing in the past 24 hours, has it worsened? No  COVID-19 EXPOSURE  Have you traveled outside the state in the past 14 days? No  Have you been in contact with someone with a confirmed diagnosis of COVID-19 or PUI in the past 14 days without wearing appropriate PPE? No  Have you been living in the same home as a person with confirmed diagnosis of COVID-19 or a PUI (household contact)? No  Have you been diagnosed with COVID-19? No

## 2019-12-30 NOTE — Progress Notes (Signed)
D: Patient is agitated and paranoid.  Denies SI and HI.   A: Attempted to establish rapport with pt.  Made empathic responses and validated pt's feelings.  Provided education on medications ordered. R: Pt would not take medications.  Tearful.  No interacting with other patients in day room.  RN will attempt to talk to pt later this evening to assess for needs/concerns and provide support.

## 2019-12-30 NOTE — BHH Counselor (Signed)
CSW attempted to meet with patient to complete PSA. Patient was extremely agitated and became irate due to EVS staff asking her about her belongings during their cleaning rounds.   Patient is requesting to discharge and is very upset. CSW will attempt to complete assessment at a later time.      Baldo Daub, MSW, LCSW Clinical Social Worker [facility]  Phone: [phone]

## 2019-12-30 NOTE — Progress Notes (Signed)
Pt did not attend the AA meeting.Pt was asleep in her room.

## 2019-12-30 NOTE — Tx Team (Cosign Needed)
Interdisciplinary Treatment and Diagnostic Plan Update  12/30/2019 Time of Session:  Sheri Hall MRN: [id number]  Principal Diagnosis: Bipolar I disorder, most recent episode mixed ([name])  Secondary Diagnoses: Principal Problem:   Bipolar I disorder, most recent episode mixed ([name]) Active Problems:   PTSD (post-traumatic stress disorder)   Major depressive disorder, recurrent episode ([name])   Current Medications:  Current Facility-Administered Medications  Medication Dose Route Frequency Provider Last Rate Last Admin  . acetaminophen (TYLENOL) tablet 650 mg  650 mg Oral Q6H PRN Lindell Spar I, NP   650 mg at 12/28/19 1847  . ARIPiprazole (ABILIFY) tablet 5 mg  5 mg Oral Daily Cobos, Myer Peer, MD   5 mg at 12/29/19 1702  . hydrOXYzine (ATARAX/VISTARIL) tablet 25 mg  25 mg Oral Q6H PRN Lindell Spar I, NP   25 mg at 12/29/19 1158  . ibuprofen (ADVIL) tablet 400 mg  400 mg Oral Q6H PRN Cobos, Myer Peer, MD   400 mg at 12/29/19 1305  . pantoprazole (PROTONIX) EC tablet 40 mg  40 mg Oral Daily Cobos, Myer Peer, MD   40 mg at 12/29/19 1702  . traZODone (DESYREL) tablet 50 mg  50 mg Oral QHS PRN Cobos, Myer Peer, MD      . venlafaxine XR (EFFEXOR-XR) 24 hr capsule 37.5 mg  37.5 mg Oral Q breakfast Cobos, Myer Peer, MD       PTA Medications: Medications Prior to Admission  Medication Sig Dispense Refill Last Dose  . acetaminophen (TYLENOL) 325 MG tablet Take 2 tablets (650 mg total) by mouth every 6 (six) hours as needed for mild pain (or Fever >/= 101). (Patient not taking: Reported on 12/27/2019)     . guaiFENesin (MUCINEX) 600 MG 12 hr tablet Take 1 tablet (600 mg total) by mouth 2 (two) times daily. (Patient not taking: Reported on 12/27/2019) 20 tablet 0     Patient Stressors: Financial difficulties Loss of two children  Patient Strengths: Capable of independent living Curator fund of knowledge Motivation for treatment/growth Physical Health Supportive  family/friends  Treatment Modalities: Medication Management, Group therapy, Case management,  1 to 1 session with clinician, Psychoeducation, Recreational therapy.   Physician Treatment Plan for Primary Diagnosis: Bipolar I disorder, most recent episode mixed ([name]) Long Term Goal(s): Improvement in symptoms so as ready for discharge Improvement in symptoms so as ready for discharge   Short Term Goals: Ability to identify changes in lifestyle to reduce recurrence of condition will improve Ability to verbalize feelings will improve Ability to disclose and discuss suicidal ideas Ability to identify and develop effective coping behaviors will improve Compliance with prescribed medications will improve Ability to identify triggers associated with substance abuse/mental health issues will improve  Medication Management: Evaluate patient's response, side effects, and tolerance of medication regimen.  Therapeutic Interventions: 1 to 1 sessions, Unit Group sessions and Medication administration.  Evaluation of Outcomes: Not Met  Physician Treatment Plan for Secondary Diagnosis: Principal Problem:   Bipolar I disorder, most recent episode mixed ([name]) Active Problems:   PTSD (post-traumatic stress disorder)   Major depressive disorder, recurrent episode ([name])  Long Term Goal(s): Improvement in symptoms so as ready for discharge Improvement in symptoms so as ready for discharge   Short Term Goals: Ability to identify changes in lifestyle to reduce recurrence of condition will improve Ability to verbalize feelings will improve Ability to disclose and discuss suicidal ideas Ability to identify and develop effective coping behaviors will improve Compliance with prescribed medications  will improve Ability to identify triggers associated with substance abuse/mental health issues will improve     Medication Management: Evaluate patient's response, side effects, and tolerance of medication  regimen.  Therapeutic Interventions: 1 to 1 sessions, Unit Group sessions and Medication administration.  Evaluation of Outcomes: Not Met   RN Treatment Plan for Primary Diagnosis: Bipolar I disorder, most recent episode mixed ([name]) Long Term Goal(s): Knowledge of disease and therapeutic regimen to maintain health will improve  Short Term Goals: Ability to verbalize frustration and anger appropriately will improve, Ability to participate in decision making will improve, Ability to verbalize feelings will improve, Ability to disclose and discuss suicidal ideas, Ability to identify and develop effective coping behaviors will improve and Compliance with prescribed medications will improve  Medication Management: RN will administer medications as ordered by provider, will assess and evaluate patient's response and provide education to patient for prescribed medication. RN will report any adverse and/or side effects to prescribing provider.  Therapeutic Interventions: 1 on 1 counseling sessions, Psychoeducation, Medication administration, Evaluate responses to treatment, Monitor vital signs and CBGs as ordered, Perform/monitor CIWA, COWS, AIMS and Fall Risk screenings as ordered, Perform wound care treatments as ordered.  Evaluation of Outcomes: Not Met   LCSW Treatment Plan for Primary Diagnosis: Bipolar I disorder, most recent episode mixed ([name]) Long Term Goal(s): Safe transition to appropriate next level of care at discharge, Engage patient in therapeutic group addressing interpersonal concerns.  Short Term Goals: Engage patient in aftercare planning with referrals and resources  Therapeutic Interventions: Assess for all discharge needs, 1 to 1 time with Social worker, Explore available resources and support systems, Assess for adequacy in community support network, Educate family and significant other(s) on suicide prevention, Complete Psychosocial Assessment, Interpersonal group  therapy.  Evaluation of Outcomes: Not Met   Progress in Treatment: Attending groups: Yes. Participating in groups: Yes. Taking medication as prescribed: Yes. Toleration medication: Yes. Family/Significant other contact made: No, will contact:  if patient consents to collateral contacts Patient understands diagnosis: Yes. Discussing patient identified problems/goals with staff: Yes. Medical problems stabilized or resolved: Yes. Denies suicidal/homicidal ideation: Yes. Issues/concerns per patient self-inventory: No. Other:   New problem(s) identified: None   New Short Term/Long Term Goal(s): Detox, medication stabilization, elimination of SI thoughts, development of comprehensive mental wellness plan.    Patient Goals:    Discharge Plan or Barriers: Patient recently admitted. CSW will continue to follow and assess for appropriate referrals and possible discharge planning.    Reason for Continuation of Hospitalization: Anxiety Depression Medication stabilization Suicidal ideation Other; describe Substance abuse    Estimated Length of Stay: 3-5 days   Attendees: Patient: 12/30/2019 10:55 AM  Physician:  12/30/2019 10:55 AM  Nursing:  12/30/2019 10:55 AM  RN Care Manager: 12/30/2019 10:55 AM  Social Worker: Radonna Ricker, LCSW 12/30/2019 10:55 AM  Recreational Therapist:  12/30/2019 10:55 AM  Other:  12/30/2019 10:55 AM  Other:  12/30/2019 10:55 AM  Other: 12/30/2019 10:55 AM    Scribe for Treatment Team: Marylee Floras, [place] 12/30/2019 10:55 AM
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
## 2019-12-30 NOTE — Progress Notes (Signed)
Recreation Therapy Notes  Date: 6.11.21 Time: 0930 Location: [address] Group Room  Group Topic: Stress Management  Goal Area(s) Addresses:  Patient will identify positive stress management techniques. Patient will identify benefits of using stress management post d/c.  Intervention: Stress Management  Activity:  Meditation.  LRT played a meditation that focused on letting go of the past and focusing on the present.  Patients were to listen and follow along as meditation played to engage in activity.   Education:  Stress Management, Discharge Planning.   Education Outcome: Acknowledges Education  Clinical Observations/Feedback: Pt did not attend group session.    Caroll Rancher, LRT/CTRS         Caroll Rancher A 12/30/2019 10:27 AM

## 2019-12-30 NOTE — Progress Notes (Signed)
   12/30/19 2048  Psych Admission Type (Psych Patients Only)  Admission Status Voluntary  Psychosocial Assessment  Patient Complaints Anxiety;Suspiciousness;Irritability  Eye Contact Brief  Facial Expression Flat  Affect Labile  Speech Logical/coherent  Interaction Minimal  Motor Activity Other (Comment) (WDL)  Appearance/Hygiene Disheveled  Behavior Characteristics Appropriate to situation  Mood Depressed  Thought Process  Coherency WDL  Content WDL  Delusions None reported or observed  Perception WDL  Hallucination None reported or observed  Judgment Poor  Confusion None  Danger to Self  Current suicidal ideation? Denies  Danger to Others  Danger to Others None reported or observed

## 2019-12-31 LAB — LIPID PANEL
Cholesterol: 160 mg/dL (ref 0–200)
HDL: 48 mg/dL (ref >40)
LDL Cholesterol: 88 mg/dL (ref 0–99)
Total CHOL/HDL Ratio: 3.3 RATIO
Triglycerides: 122 mg/dL (ref <150)
VLDL: 24 mg/dL (ref 0–40)

## 2019-12-31 LAB — CBC WITH DIFFERENTIAL/PLATELET
Abs Immature Granulocytes: 0.01 10*3/uL (ref 0.00–0.07)
Basophils Absolute: 0 10*3/uL (ref 0.0–0.1)
Basophils Relative: 0 %
Eosinophils Absolute: 0.1 10*3/uL (ref 0.0–0.5)
Eosinophils Relative: 2 %
HCT: 34.8 % — ABNORMAL LOW (ref 36.0–46.0)
Hemoglobin: 10.9 g/dL — ABNORMAL LOW (ref 12.0–15.0)
Immature Granulocytes: 0 %
Lymphocytes Relative: 49 %
Lymphs Abs: 2.8 10*3/uL (ref 0.7–4.0)
MCH: 28.2 pg (ref 26.0–34.0)
MCHC: 31.3 g/dL (ref 30.0–36.0)
MCV: 89.9 fL (ref 80.0–100.0)
Monocytes Absolute: 0.5 10*3/uL (ref 0.1–1.0)
Monocytes Relative: 8 %
Neutro Abs: 2.4 10*3/uL (ref 1.7–7.7)
Neutrophils Relative %: 41 %
Platelets: 302 10*3/uL (ref 150–400)
RBC: 3.87 MIL/uL (ref 3.87–5.11)
RDW: 16.7 % — ABNORMAL HIGH (ref 11.5–15.5)
WBC: 5.8 10*3/uL (ref 4.0–10.5)
nRBC: 0 % (ref 0.0–0.2)

## 2019-12-31 LAB — HEMOGLOBIN A1C
Hgb A1c MFr Bld: 6.4 % — ABNORMAL HIGH (ref 4.8–5.6)
Mean Plasma Glucose: 136.98 mg/dL

## 2019-12-31 LAB — TSH: TSH: 0.605 u[IU]/mL (ref 0.350–4.500)

## 2019-12-31 MED ORDER — ENSURE ENLIVE PO LIQD
237.0000 mL | Freq: Two times a day (BID) | ORAL | Status: DC
  Administered 2019-12-31 – 2020-01-04 (×8): 237 mL via ORAL

## 2019-12-31 MED ORDER — VENLAFAXINE HCL ER 75 MG PO CP24
75.0000 mg | ORAL_CAPSULE | Freq: Every day | ORAL | Status: DC
  Administered 2020-01-01 – 2020-01-04 (×4): 75 mg via ORAL
  Filled 2019-12-31 (×5): qty 1

## 2019-12-31 MED ORDER — WHITE PETROLATUM EX OINT
TOPICAL_OINTMENT | CUTANEOUS | Status: AC
  Filled 2019-12-31: qty 5

## 2019-12-31 MED ORDER — ARIPIPRAZOLE 10 MG PO TABS
10.0000 mg | ORAL_TABLET | Freq: Every day | ORAL | Status: DC
  Administered 2020-01-01: 10 mg via ORAL
  Filled 2019-12-31 (×2): qty 1

## 2019-12-31 NOTE — [facility] Counselor (Signed)
Clinical Social Work Note  CSW attempted to meet with patient to do Psychosocial Assessment, but she was irritable and said she needs to leave "this place" before she will feel better.  When told that one thing we need to talk about is the aftercare she wants so that we can go ahead and work on that in order to prepare her to leave, she said angrily that she could not talk right now.  CSW team will continue attempts.  Ambrose Mantle, LCSW 12/31/2019, 4:20 PM

## 2019-12-31 NOTE — Progress Notes (Signed)
   12/31/19 2003  COVID-19 Daily Checkoff  Have you had a fever (temp > 37.80C/100F)  in the past 24 hours?  No  If you have had runny nose, nasal congestion, sneezing in the past 24 hours, has it worsened? No  COVID-19 EXPOSURE  Have you traveled outside the state in the past 14 days? No  Have you been in contact with someone with a confirmed diagnosis of COVID-19 or PUI in the past 14 days without wearing appropriate PPE? No  Have you been living in the same home as a person with confirmed diagnosis of COVID-19 or a PUI (household contact)? No  Have you been diagnosed with COVID-19? No

## 2019-12-31 NOTE — Progress Notes (Signed)
   12/31/19 2005  Psych Admission Type (Psych Patients Only)  Admission Status Voluntary  Psychosocial Assessment  Patient Complaints Anxiety  Eye Contact Brief  Facial Expression Flat  Affect Labile  Speech Logical/coherent  Interaction Minimal  Motor Activity Other (Comment) (WDL)  Appearance/Hygiene Unremarkable;In scrubs  Behavior Characteristics Appropriate to situation  Mood Labile  Thought Process  Coherency WDL  Content WDL  Delusions None reported or observed  Perception WDL  Hallucination None reported or observed  Judgment Poor  Confusion None  Danger to Self  Current suicidal ideation? Denies  Danger to Others  Danger to Others None reported or observed

## 2019-12-31 NOTE — Progress Notes (Signed)
Pt was assessed this morning and disclosed minimal information. She did deny having SI and was able to contract for safety.   Orders reviewed. V/s assessed. Verbal support provided. 15 minute checks performed for safety.   No concerns verbalized by the pt.

## 2019-12-31 NOTE — [facility] Group Notes (Signed)
LCSW Group Therapy Note  12/31/2019   10:00-11:00am   Type of Therapy and Topic:  Group Therapy: Anger Cues and Responses  Participation Level:  Active   Description of Group:   In this group, patients learned how to recognize the physical, cognitive, emotional, and behavioral responses they have to anger-provoking situations.  They identified a recent time they became angry and how they reacted.  They analyzed how their reaction was possibly beneficial and how it was possibly unhelpful.  The group discussed a variety of healthier coping skills that could help with such a situation in the future.  Focus was placed on how helpful it is to recognize the underlying emotions to our anger, because working on those can lead to a more permanent solution as well as our ability to focus on the important rather than the urgent.  Therapeutic Goals: 1. Patients will remember their last incident of anger and how they felt emotionally and physically, what their thoughts were at the time, and how they behaved. 2. Patients will identify how their behavior at that time worked for them, as well as how it worked against them. 3. Patients will explore possible new behaviors to use in future anger situations. 4. Patients will learn that anger itself is normal and cannot be eliminated, and that healthier reactions can assist with resolving conflict rather than worsening situations.  Summary of Patient Progress:  The patient shared that her most recent time of anger was yesterday throughout the day and said her anger was initiated by various staff actions.  She stated that she is normally the "go to" person for everyone in her life.  She is "mom" to 7 sons, 1 daughter, and 5 grandchildren and has taken in a bunch of people in the neighborhood.  She stated she does not normally ask for help, is the strong woman, helps others and does not expect for them to help her.  She talked about 2 of her sons being murdered.   Yesterday, she states that she was "ready to leave, wanted people to leave me alone," was cursing and calling people names, refused all her medications, and cried a great deal.  She said she does not want to be like that but even at home she is "a ticking time bomb."  She stuffs her problems down inside, saying she has nobody to talk to, but also refusing for other people to support her in the way she supports them.  She stated that she has felt overwhelmed that she cannot tell anyone how she feels and she is tired.  Other patients were very supportive of her, but when one of them told her "You are a strong woman" she became upset and said that is what she is tired of hearing people say, because it does not help when she is not and does not want to be a strong woman right now.  Therapeutic Modalities:   Cognitive Behavioral Therapy  Lynnell Chad

## 2019-12-31 NOTE — [facility] Group Notes (Signed)
Adult Psychoeducational Group Note  Date:  12/31/2019 Time:  2:42 PM  Group Topic/Focus:  Identifying Needs:   The focus of this group is to help patients identify their personal needs that have been historically problematic and identify healthy behaviors to address their needs.  Participation Level:  Did Not Attend  Dione Housekeeper 12/31/2019, 2:42 PM

## 2019-12-31 NOTE — Progress Notes (Signed)
Patient did not attend wrap-up group because she was asleep.  

## 2019-12-31 NOTE — [facility] Group Notes (Signed)
Adult Psychoeducational Group Note  Date:  12/31/2019 Time:  12:42 PM  Group Topic/Focus:  Goals Group:   The focus of this group is to help patients establish daily goals to achieve during treatment and discuss how the patient can incorporate goal setting into their daily lives to aide in recovery.  Participation Level:  Did Not Attend   Dione Housekeeper 12/31/2019, 12:42 PM

## 2019-12-31 NOTE — Progress Notes (Signed)
Patient ID: Linder Prajapati, female   DOB: 11-Jun-1971, 49 y.o.   MRN: [id number] [facility] MD Progress Note  12/31/2019 3:44 PM Britni Driscoll  MRN:  [id number]  Subjective: she reports still feeling depressed, easily irritated . Denies suicidal ideations at this time. Denies medication side effects.  Objective: I have reviewed chart notes and have met with patient. 25 y old female, presented for worsening depression, affective lability, suicidal thoughts of " running out into the street", neuro-vegetative symptoms including anhedonia, decreased appetite and decreased energy level, intermittent visual hallucinations , of seeing figures/shadows. She had relapsed on cocaine several months ago, and was using this substance 3-4 x week. Also reported ongoing grief related to the deaths of two of her children ( 2013, 2017) . Past history of Bipolar Disorder diagnosis, and endorses episodes of mood instability,  irritability, anger.  Today patient remains depressed, dysphoric,but calm/cooperative on approach at this time, without psychomotor agitation .  (Yesterday had angry outburst at which  was loud, irritable, angry. Gradually improved with staff intervention and support ) Group participation has been limited .  Currently denies suicidal ideations and contracts for safety She reports hallucinations have improved and at this time does not appear internally preoccupied . Currently on Abilify/Effexor XR . Tolerating these well thus far .  Principal Problem: Bipolar I disorder, most recent episode mixed ([place])  Diagnosis:   Patient Active Problem List   Diagnosis Date Noted  . Bipolar I disorder, most recent episode mixed ([place]) [F31.60] 12/29/2019  . Major depressive disorder, recurrent episode ([place]) [F33.9] 12/28/2019  . Lobar pneumonia ([place]) [J18.1] 07/17/2018  . Normocytic anemia [D64.9] 07/17/2018  . Cocaine use [F14.90] 07/17/2018  . Rheumatoid arthritis ([place]) [M06.9] 07/17/2018  . Sepsis ([place])  [A41.9] 07/16/2018  . PTSD (post-traumatic stress disorder) [F43.10] 09/17/2017  . Substance use disorder [F19.90] 09/17/2017  . Grief [F43.21] 09/17/2017  . MDD (major depressive disorder), severe ([place]) [F32.2] 09/16/2017   Total Time spent with patient: 25 minutes  Past Psychiatric History: See H&P  Past Medical History:  Past Medical History:  Diagnosis Date  . Anxiety   . Arthritis    rheumatoid   . Asthma    seasonal  . Bipolar disorder ([place])   . Depression   . GERD (gastroesophageal reflux disease)   . OCD (obsessive compulsive disorder)   . PTSD (post-traumatic stress disorder)     Past Surgical History:  Procedure Laterality Date  . CESAREAN SECTION    . OPEN REDUCTION INTERNAL FIXATION (ORIF) DISTAL RADIAL FRACTURE Left 10/15/2015   Procedure: OPEN TREATMENT OF LEFT DISTAL RADIUS FRACTURE;  Surgeon: Milly Jakob, MD;  Location: [place];  Service: Orthopedics;  Laterality: Left;   Family History:  Family History  Family history unknown: Yes   Family Psychiatric  History: See H&P Social History:  Social History   Substance and Sexual Activity  Alcohol Use Yes   Comment: occ; liquor     Social History   Substance and Sexual Activity  Drug Use Yes  . Types: Cocaine, "Crack" cocaine   Comment: wednesday March 22nd 2017    Social History   Socioeconomic History  . Marital status: Single    Spouse name: Not on file  . Number of children: Not on file  . Years of education: Not on file  . Highest education level: Not on file  Occupational History  . Not on file  Tobacco Use  . Smoking status: Current Every Day Smoker    Packs/day:  0.50    Types: Cigarettes  . Smokeless tobacco: Never Used  Vaping Use  . Vaping Use: Never used  Substance and Sexual Activity  . Alcohol use: Yes    Comment: occ; liquor  . Drug use: Yes    Types: Cocaine, "Crack" cocaine    Comment: wednesday March 22nd 2017  . Sexual activity: Yes    Birth  control/protection: I.U.D.    Comment: Mirena  Other Topics Concern  . Not on file  Social History Narrative  . Not on file   Social Determinants of Health   Financial Resource Strain:   . Difficulty of Paying Living Expenses:   Food Insecurity:   . Worried About Charity fundraiser in the Last Year:   . Arboriculturist in the Last Year:   Transportation Needs:   . Film/video editor (Medical):   Marland Kitchen Lack of Transportation (Non-Medical):   Physical Activity:   . Days of Exercise per Week:   . Minutes of Exercise per Session:   Stress:   . Feeling of Stress :   Social Connections:   . Frequency of Communication with Friends and Family:   . Frequency of Social Gatherings with Friends and Family:   . Attends Religious Services:   . Active Member of Clubs or Organizations:   . Attends Archivist Meetings:   Marland Kitchen Marital Status:    Additional Social History:   Sleep: Fair  Appetite:  Fair  Current Medications: Current Facility-Administered Medications  Medication Dose Route Frequency Provider Last Rate Last Admin  . acetaminophen (TYLENOL) tablet 650 mg  650 mg Oral Q6H PRN Lindell Spar I, NP   650 mg at 12/28/19 1847  . [START ON 01/01/2020] ARIPiprazole (ABILIFY) tablet 10 mg  10 mg Oral Daily Keyshawna Prouse A, MD      . feeding supplement (ENSURE ENLIVE) (ENSURE ENLIVE) liquid 237 mL  237 mL Oral BID BM Maycen Degregory, Myer Peer, MD   237 mL at 12/31/19 0957  . hydrOXYzine (ATARAX/VISTARIL) tablet 25 mg  25 mg Oral Q6H PRN Lindell Spar I, NP   25 mg at 12/29/19 1158  . hydrOXYzine (ATARAX/VISTARIL) tablet 50 mg  50 mg Oral Once Lindell Spar I, NP      . ibuprofen (ADVIL) tablet 400 mg  400 mg Oral Q6H PRN Sevana Grandinetti, Myer Peer, MD   400 mg at 12/30/19 1618  . pantoprazole (PROTONIX) EC tablet 40 mg  40 mg Oral Daily Magaret Justo, Myer Peer, MD   40 mg at 12/31/19 0956  . traZODone (DESYREL) tablet 50 mg  50 mg Oral QHS PRN Meri Pelot, Myer Peer, MD      . Derrill Memo ON 01/01/2020]  venlafaxine XR (EFFEXOR-XR) 24 hr capsule 75 mg  75 mg Oral Q breakfast Coady Train A, MD      . ziprasidone (GEODON) injection 20 mg  20 mg Intramuscular Once Lindell Spar I, NP       Lab Results: No results found for this or any previous visit (from the past 77 hour(s)).  Blood Alcohol level:  Lab Results  Component Value Date   ETH <10 12/27/2019   ETH <10 91/47/8295   Metabolic Disorder Labs: No results found for: HGBA1C, MPG No results found for: PROLACTIN No results found for: CHOL, TRIG, HDL, CHOLHDL, VLDL, LDLCALC  Physical Findings: AIMS: Facial and Oral Movements Muscles of Facial Expression: None, normal Lips and Perioral Area: None, normal Jaw: None, normal Tongue: None, normal,Extremity Movements Upper (arms,  wrists, hands, fingers): None, normal Lower (legs, knees, ankles, toes): None, normal, Trunk Movements Neck, shoulders, hips: None, normal, Overall Severity Severity of abnormal movements (highest score from questions above): None, normal Incapacitation due to abnormal movements: None, normal Patient's awareness of abnormal movements (rate only patient's report): No Awareness, Dental Status Current problems with teeth and/or dentures?: No Does patient usually wear dentures?: No  CIWA:    COWS:     Musculoskeletal: Strength & Muscle Tone: within normal limits Gait & Station: normal Patient leans: N/A  Psychiatric Specialty Exam: Physical Exam  Constitutional: She is oriented to person, place, and time. She appears well-developed.  HENT:  Head: Normocephalic and atraumatic.  Mouth/Throat: Oropharynx is clear.  Eyes: Pupils are equal, round, and reactive to light.  Cardiovascular: Normal rate.  Respiratory: Effort normal. No respiratory distress. She has no wheezes. She exhibits no tenderness.  Genitourinary:    Genitourinary Comments: Deferred   Musculoskeletal:        General: Normal range of motion.     Cervical back: Normal range of motion.   Neurological: She is alert and oriented to person, place, and time.  Skin: Skin is warm and dry.  Psychiatric:  As above     Review of Systems  Constitutional: Negative for chills, diaphoresis and fever.  HENT: Negative for congestion and sore throat.   Eyes: Negative for blurred vision.  Respiratory: Negative for cough, shortness of breath and wheezing.   Cardiovascular: Negative for chest pain and palpitations.  Gastrointestinal: Negative for diarrhea, heartburn, nausea and vomiting.  Genitourinary: Negative for dysuria.  Musculoskeletal: Negative for myalgias.  Skin: Negative for itching and rash.  Neurological: Negative for dizziness, tremors, seizures, weakness and headaches.  Endo/Heme/Allergies: Negative for environmental allergies. Does not bruise/bleed easily.       Allergies: NKDA  Psychiatric/Behavioral: Positive for depression and substance abuse (Hx. Cocaine use disorder). Negative for hallucinations, memory loss and suicidal ideas. The patient has insomnia. The patient is not nervous/anxious (Stable).   no chest pain, no shortness of breath, no vomiting   Blood pressure (!) 127/98, pulse 90, temperature 97.7 F (36.5 C), temperature source Oral, resp. rate 16, height '5\' 2"'  (1.575 m), weight 57.6 kg, SpO2 97 %.Body mass index is 23.23 kg/m.  General Appearance: improving grooming   Eye Contact:  Fair  Speech:  normal  Volume:  Normal  Mood:  Depressed, dysphoric   Affect: congruent   Thought Process:  linear and Descriptions of Associations: Intact  Orientation:  fully alert and attentive  Thought Content:  not currently internally preoccupied/no halls, no delusions expressed at this time  Suicidal Thoughts: at this time denies SI or self injurious ideations and contracts for safety  Homicidal Thoughts:  Denies  Memory: recent and remote grossly intact   Judgement:  Fair/ improving   Insight:  fair   Psychomotor Activity: no psychomotor agitation  Concentration:   Concentration: improving and Attention Span: improving   Recall:  good   Fund of Knowledge:  good   Language:  Good  Akathisia:  Negative  Handed:  Right  AIMS (if indicated):     Assets:  Communication Skills Desire for Improvement Physical Health  ADL's:  Intact  Cognition:  WNL    Sleep:  Number of Hours: 6.75    Assessment: 26 y old female, presented for worsening depression, affective lability, suicidal thoughts of " running out into the street", neuro-vegetative symptoms including anhedonia, decreased appetite and decreased energy level, intermittent visual hallucinations ,  of seeing figures/shadows. She had relapsed on cocaine several months ago, and was using this substance 3-4 x week. Also reported ongoing grief related to the deaths of two of her children ( 2013, 2017) . Past history of Bipolar Disorder diagnosis, and endorses episodes of mood instability,  irritability, anger.  Today patient remains depressed, with constricted/vaguely irritable/dysphoric affect. Denies SI and contracts for safety on unit . No hallucinations noted at this time, no delusions expressed, and thought process linear/organized . She slept better last night. Currently on Abilify/Effexor XR , which she is tolerating well.    Treatment Plan/Recommendations:  Treatment Plan reviewed as below today 6/12 Encourage group and milieu participation Encourage efforts to work on sobriety , relapse prevention Treatment team working on disposition planning options Increase Abilify to 10 mgrs QDAY for mood disorder Increase Effexor XR to 75 mgrs QDAY for depression, anxiety Continue Vistaril 25 mgrs Q 6 hours PRN for anxiety Continue Trazodone 50 mgrs QHS PRN for insomnia Check TSH, Lipid Panel, HgbA1C   Jenne Campus, MD 12/31/2019, 3:44 PM   Patient ID: Walker Shadow, female   DOB: 08/24/1970, 49 y.o.   MRN: [id number]

## 2020-01-01 MED ORDER — ARIPIPRAZOLE 15 MG PO TABS
15.0000 mg | ORAL_TABLET | Freq: Every day | ORAL | Status: DC
  Administered 2020-01-02 – 2020-01-04 (×3): 15 mg via ORAL
  Filled 2020-01-01 (×4): qty 1

## 2020-01-01 NOTE — Progress Notes (Signed)
   01/01/20 0800  Psych Admission Type (Psych Patients Only)  Admission Status Voluntary  Psychosocial Assessment  Patient Complaints Anxiety;Depression  Eye Contact Brief  Facial Expression Flat  Affect Labile  Speech Logical/coherent  Interaction Minimal  Motor Activity Slow  Appearance/Hygiene Unremarkable  Behavior Characteristics Irritable  Mood Depressed;Anxious;Labile  Aggressive Behavior  Effect No apparent injury  Thought Process  Coherency WDL  Content WDL  Delusions None reported or observed  Perception WDL  Hallucination None reported or observed  Judgment Poor  Confusion None  Danger to Self  Current suicidal ideation? Denies  Danger to Others  Danger to Others None reported or observed

## 2020-01-01 NOTE — Progress Notes (Signed)
   01/01/20 2030  COVID-19 Daily Checkoff  Have you had a fever (temp > 37.80C/100F)  in the past 24 hours?  No  COVID-19 EXPOSURE  Have you traveled outside the state in the past 14 days? No  Have you been in contact with someone with a confirmed diagnosis of COVID-19 or PUI in the past 14 days without wearing appropriate PPE? No  Have you been living in the same home as a person with confirmed diagnosis of COVID-19 or a PUI (household contact)? No  Have you been diagnosed with COVID-19? No

## 2020-01-01 NOTE — [facility] Suicide Risk Assessment (Signed)
[facility] INPATIENT:  Family/Significant Other Suicide Prevention Education  Suicide Prevention Education:  Patient Refusal for Family/Significant Other Suicide Prevention Education: The patient Sheri Hall has refused to provide written consent for family/significant other to be provided Family/Significant Other Suicide Prevention Education during admission and/or prior to discharge.  Physician notified.  Carloyn Jaeger Grossman-Orr 01/01/2020, 9:51 AM

## 2020-01-01 NOTE — Progress Notes (Signed)
Patient did not attend wrap-up group because she was asleep.  

## 2020-01-01 NOTE — [facility] Group Notes (Signed)
[facility] LCSW Group Therapy Note  01/01/2020    Type of Therapy and Topic:  Group Therapy:  Adding Supports Including Yourself  Participation Level:  Did Not Attend   Description of Group:   Patients in this group were introduced to the concept that additional supports including self-support are an essential part of recovery.  Patients listed what supports they believe they need to add to their lives to achieve their goals at discharge, and they listed such things as therapist, family, doctor, support groups, 12-step groups and service animals.   A song entitled "My Own Hero" was played and a group discussion ensued in which patients stated they could relate to the song and it inspired them to realize they have be willing to help themselves in order to succeed, because other people cannot achieve sobriety or stability for them.  "Fight For It" was played, then "I Am Enough" to encourage patients.  They discussed the impact on them and how they must remain convinced that their lives are worth the effort it takes to become sober and/or stable.  Therapeutic Goals: 1)  demonstrate the importance of being a key part of one's own support system 2)  discuss various available supports 3)  encourage patient to use music as part of their self-support and focus on goals 4)  elicit ideas from patients about supports that need to be added   Summary of Patient Progress:  did not attend  Therapeutic Modalities:   Motivational Interviewing Activity  Carloyn Jaeger Grossman-Orr  2:55 PM

## 2020-01-01 NOTE — Progress Notes (Signed)
   01/01/20 2030  Psych Admission Type (Psych Patients Only)  Admission Status Voluntary  Psychosocial Assessment  Patient Complaints Anxiety;Depression;Isolation;Worrying  Eye Contact Brief  Facial Expression Flat;Sad  Affect Depressed;Sad;Labile  Speech Logical/coherent;Soft  Interaction Minimal  Motor Activity Other (Comment) (WNL)  Appearance/Hygiene Unremarkable  Behavior Characteristics Appropriate to situation;Calm;Cooperative  Mood Depressed;Anxious  Thought Process  Coherency WDL  Content WDL  Delusions None reported or observed  Perception WDL  Hallucination None reported or observed  Judgment Poor  Confusion None  Danger to Self  Current suicidal ideation? Denies  Danger to Others  Danger to Others None reported or observed

## 2020-01-01 NOTE — [facility] Group Notes (Signed)
Adult Psychoeducational Group Note  Date:  01/01/2020  Time:  9:00am-9:45am  Group Topic/Focus: PROGRESSIVE RELAXATION. A group where deep breathing is taught and tensing and relaxation muscle groups is used. Imagery is used as well.  Pts are asked to imagine 3 pillars that hold them up when they are not able to hold themselves up.  Participation Level:  Active  Participation Quality:  Appropriate  Affect:  Flat  Cognitive:  Appropriate  Insight: Improving  Engagement in Group:  Engaged  Modes of Intervention:  Activity, Discussion, Education, and Support  Additional Comments:  Pt rates her energy level a 5/10. States the pillars that hold her up are her granddaughter, her goals and her family. She has learned in her lifetime to go with her gut.  Vira Blanco A 12:13 PM

## 2020-01-01 NOTE — [facility] Group Notes (Signed)
Adult Psychoeducational Group Note  Date:  01/01/2020 Time:  2:47 PM  Group Topic/Focus:  Making Healthy Choices:   The focus of this group is to help patients identify negative/unhealthy choices they were using prior to admission and identify positive/healthier coping strategies to replace them upon discharge.  Participation Level:  Active  Participation Quality:  Appropriate  Affect:  Appropriate  Cognitive:  Oriented  Insight: Improving  Engagement in Group:  Engaged  Modes of Intervention:  Discussion, Education and Support  Additional Comments:  Pt rates her energy level at a 3, however was active in the group.  Dione Housekeeper 01/01/2020, 2:47 PM
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
## 2020-01-01 NOTE — Progress Notes (Signed)
Patient ID: Sheri Hall, female   DOB: August 08, 1970, 49 y.o.   MRN: [id number] [facility] MD Progress Note  01/01/2020 2:22 PM Sheri Hall  MRN:  [id number]  Subjective: she states she still feels depressed. Denies suicidal ideations at this time. Denies medication side effects .  Objective: I have reviewed chart notes and have met with patient. 49 y old female, presented for worsening depression, affective lability, suicidal thoughts of " running out into the street", neuro-vegetative symptoms including anhedonia, decreased appetite and decreased energy level, intermittent visual hallucinations , of seeing figures/shadows. She had relapsed on cocaine several months ago, and was using this substance 3-4 x week. Also reported ongoing grief related to the deaths of two of her children ( 2013, 2017) . Past history of Bipolar Disorder diagnosis, and endorses episodes of mood instability,  irritability, anger.  Patient presents alert, attentive , calm, in no acute distress. She reports some improvement compared to admission but describes ongoing depression, and presents with a relatively constricted /vaguely irritable affect . She denies any hallucinations at this time and does not appear internally preoccupied. She does report feeling guarded and " paranoid", although no delusions are expressed . She describes as a vague feeling of being " watched". Staff has also reported that patient has presented guarded /cautious on approach or when taking her medications.  She denies medication side effects ( Abilify, Effexor XR), has tolerated well thus far . No psychomotor agitation or akathisia currently noted . Labs reviewed- Lipid panel unremarkable, CBC WBC 5.8, Hgb 10.9, differential normal, HgbA1C 6.4 , Serum glucose 136 ( non fasting) , TSH 0.605   Principal Problem: Bipolar I disorder, most recent episode mixed ([place])  Diagnosis:   Patient Active Problem List   Diagnosis Date Noted  . Bipolar I disorder,  most recent episode mixed ([place]) [F31.60] 12/29/2019  . Major depressive disorder, recurrent episode ([place]) [F33.9] 12/28/2019  . Lobar pneumonia ([place]) [J18.1] 07/17/2018  . Normocytic anemia [D64.9] 07/17/2018  . Cocaine use [F14.90] 07/17/2018  . Rheumatoid arthritis ([place]) [M06.9] 07/17/2018  . Sepsis ([place]) [A41.9] 07/16/2018  . PTSD (post-traumatic stress disorder) [F43.10] 09/17/2017  . Substance use disorder [F19.90] 09/17/2017  . Grief [F43.21] 09/17/2017  . MDD (major depressive disorder), severe ([place]) [F32.2] 09/16/2017   Total Time spent with patient: 25 minutes  Past Psychiatric History: See H&P  Past Medical History:  Past Medical History:  Diagnosis Date  . Anxiety   . Arthritis    rheumatoid   . Asthma    seasonal  . Bipolar disorder ([place])   . Depression   . GERD (gastroesophageal reflux disease)   . OCD (obsessive compulsive disorder)   . PTSD (post-traumatic stress disorder)     Past Surgical History:  Procedure Laterality Date  . CESAREAN SECTION    . OPEN REDUCTION INTERNAL FIXATION (ORIF) DISTAL RADIAL FRACTURE Left 10/15/2015   Procedure: OPEN TREATMENT OF LEFT DISTAL RADIUS FRACTURE;  Surgeon: Milly Jakob, MD;  Location: [facility];  Service: Orthopedics;  Laterality: Left;   Family History:  Family History  Family history unknown: Yes   Family Psychiatric  History: See H&P Social History:  Social History   Substance and Sexual Activity  Alcohol Use Yes   Comment: occ; liquor     Social History   Substance and Sexual Activity  Drug Use Yes  . Types: Cocaine, "Crack" cocaine   Comment: wednesday March 22nd 2017    Social History   Socioeconomic History  . Marital  status: Single    Spouse name: Not on file  . Number of children: Not on file  . Years of education: Not on file  . Highest education level: Not on file  Occupational History  . Not on file  Tobacco Use  . Smoking status: Current Every Day Smoker     Packs/day: 0.50    Types: Cigarettes  . Smokeless tobacco: Never Used  Vaping Use  . Vaping Use: Never used  Substance and Sexual Activity  . Alcohol use: Yes    Comment: occ; liquor  . Drug use: Yes    Types: Cocaine, "Crack" cocaine    Comment: wednesday March 22nd 2017  . Sexual activity: Yes    Birth control/protection: I.U.D.    Comment: Mirena  Other Topics Concern  . Not on file  Social History Narrative  . Not on file   Social Determinants of Health   Financial Resource Strain:   . Difficulty of Paying Living Expenses:   Food Insecurity:   . Worried About Charity fundraiser in the Last Year:   . Arboriculturist in the Last Year:   Transportation Needs:   . Film/video editor (Medical):   Marland Kitchen Lack of Transportation (Non-Medical):   Physical Activity:   . Days of Exercise per Week:   . Minutes of Exercise per Session:   Stress:   . Feeling of Stress :   Social Connections:   . Frequency of Communication with Friends and Family:   . Frequency of Social Gatherings with Friends and Family:   . Attends Religious Services:   . Active Member of Clubs or Organizations:   . Attends Archivist Meetings:   Marland Kitchen Marital Status:    Additional Social History:   Sleep: Fair/ improving   Appetite:  Fair  Current Medications: Current Facility-Administered Medications  Medication Dose Route Frequency Provider Last Rate Last Admin  . acetaminophen (TYLENOL) tablet 650 mg  650 mg Oral Q6H PRN Lindell Spar I, NP   650 mg at 12/28/19 1847  . [START ON 01/02/2020] ARIPiprazole (ABILIFY) tablet 15 mg  15 mg Oral Daily Dayyan Krist A, MD      . feeding supplement (ENSURE ENLIVE) (ENSURE ENLIVE) liquid 237 mL  237 mL Oral BID BM [place] Lebeau, Myer Peer, MD   237 mL at 01/01/20 0938  . hydrOXYzine (ATARAX/VISTARIL) tablet 25 mg  25 mg Oral Q6H PRN Lindell Spar I, NP   25 mg at 12/29/19 1158  . hydrOXYzine (ATARAX/VISTARIL) tablet 50 mg  50 mg Oral Once Lindell Spar I, NP       . ibuprofen (ADVIL) tablet 400 mg  400 mg Oral Q6H PRN Antonette Hendricks, Myer Peer, MD   400 mg at 01/01/20 0743  . pantoprazole (PROTONIX) EC tablet 40 mg  40 mg Oral Daily Yanel Dombrosky, Myer Peer, MD   40 mg at 01/01/20 0742  . traZODone (DESYREL) tablet 50 mg  50 mg Oral QHS PRN Aubrina Nieman, Myer Peer, MD      . venlafaxine XR (EFFEXOR-XR) 24 hr capsule 75 mg  75 mg Oral Q breakfast Dartanyan Deasis, Myer Peer, MD   75 mg at 01/01/20 0742  . ziprasidone (GEODON) injection 20 mg  20 mg Intramuscular Once Lindell Spar I, NP       Lab Results:  Results for orders placed or performed during the hospital encounter of 12/28/19 (from the past 48 hour(s))  TSH     Status: None   Collection Time: 12/31/19  6:12 PM  Result Value Ref Range   TSH 0.605 0.350 - 4.500 uIU/mL    Comment: Performed by a 3rd Generation assay with a functional sensitivity of <=0.01 uIU/mL. Performed at [facility], [place] [address], [place], [place] [address]   Lipid panel     Status: None   Collection Time: 12/31/19  6:12 PM  Result Value Ref Range   Cholesterol 160 0 - 200 mg/dL   Triglycerides 122 <150 mg/dL   HDL 48 >40 mg/dL   Total CHOL/HDL Ratio 3.3 RATIO   VLDL 24 0 - 40 mg/dL   LDL Cholesterol 88 0 - 99 mg/dL    Comment:        Total Cholesterol/HDL:CHD Risk Coronary Heart Disease Risk Table                     Men   Women  1/2 Average Risk   3.4   3.3  Average Risk       5.0   4.4  2 X Average Risk   9.6   7.1  3 X Average Risk  23.4   11.0        Use the calculated Patient Ratio above and the CHD Risk Table to determine the patient's CHD Risk.        ATP III CLASSIFICATION (LDL):  <100     mg/dL   Optimal  100-129  mg/dL   Near or Above                    Optimal  130-159  mg/dL   Borderline  160-189  mg/dL   High  >190     mg/dL   Very High Performed at [facility] [address], [place], [place] [address]   Hemoglobin A1c     Status: Abnormal   Collection Time: 12/31/19  6:12  PM  Result Value Ref Range   Hgb A1c MFr Bld 6.4 (H) 4.8 - 5.6 %    Comment: (NOTE) Pre diabetes:          5.7%-6.4%  Diabetes:              >6.4%  Glycemic control for   <7.0% adults with diabetes    Mean Plasma Glucose 136.98 mg/dL    Comment: Performed at [facility] [address], [place], [place] [address]  CBC with Differential/Platelet     Status: Abnormal   Collection Time: 12/31/19  6:12 PM  Result Value Ref Range   WBC 5.8 4.0 - 10.5 K/uL   RBC 3.87 3.87 - 5.11 MIL/uL   Hemoglobin 10.9 (L) 12.0 - 15.0 g/dL   HCT 34.8 (L) 36 - 46 %   MCV 89.9 80.0 - 100.0 fL   MCH 28.2 26.0 - 34.0 pg   MCHC 31.3 30.0 - 36.0 g/dL   RDW 16.7 (H) 11.5 - 15.5 %   Platelets 302 150 - 400 K/uL   nRBC 0.0 0.0 - 0.2 %   Neutrophils Relative % 41 %   Neutro Abs 2.4 1.7 - 7.7 K/uL   Lymphocytes Relative 49 %   Lymphs Abs 2.8 0.7 - 4.0 K/uL   Monocytes Relative 8 %   Monocytes Absolute 0.5 0 - 1 K/uL   Eosinophils Relative 2 %   Eosinophils Absolute 0.1 0 - 0 K/uL   Basophils Relative 0 %   Basophils Absolute 0.0 0 - 0 K/uL   Immature  Granulocytes 0 %   Abs Immature Granulocytes 0.01 0.00 - 0.07 K/uL    Comment: Performed at [facility], [place] [address], [place], [place] [address]    Blood Alcohol level:  Lab Results  Component Value Date   ETH <10 12/27/2019   ETH <10 37/90/2409   Metabolic Disorder Labs: Lab Results  Component Value Date   HGBA1C 6.4 (H) 12/31/2019   MPG 136.98 12/31/2019   No results found for: PROLACTIN Lab Results  Component Value Date   CHOL 160 12/31/2019   TRIG 122 12/31/2019   HDL 48 12/31/2019   CHOLHDL 3.3 12/31/2019   VLDL 24 12/31/2019   LDLCALC 88 12/31/2019    Physical Findings: AIMS: Facial and Oral Movements Muscles of Facial Expression: None, normal Lips and Perioral Area: None, normal Jaw: None, normal Tongue: None, normal,Extremity Movements Upper (arms, wrists, hands, fingers): None, normal Lower  (legs, knees, ankles, toes): None, normal, Trunk Movements Neck, shoulders, hips: None, normal, Overall Severity Severity of abnormal movements (highest score from questions above): None, normal Incapacitation due to abnormal movements: None, normal Patient's awareness of abnormal movements (rate only patient's report): No Awareness, Dental Status Current problems with teeth and/or dentures?: No Does patient usually wear dentures?: No  CIWA:    COWS:     Musculoskeletal: Strength & Muscle Tone: within normal limits Gait & Station: normal Patient leans: N/A  Psychiatric Specialty Exam: Physical Exam  Constitutional: She is oriented to person, place, and time. She appears well-developed.  HENT:  Head: Normocephalic and atraumatic.  Mouth/Throat: Oropharynx is clear.  Eyes: Pupils are equal, round, and reactive to light.  Cardiovascular: Normal rate.  Respiratory: Effort normal. No respiratory distress. She has no wheezes. She exhibits no tenderness.  Genitourinary:    Genitourinary Comments: Deferred   Musculoskeletal:        General: Normal range of motion.     Cervical back: Normal range of motion.  Neurological: She is alert and oriented to person, place, and time.  Skin: Skin is warm and dry.  Psychiatric:  As above     Review of Systems  Constitutional: Negative for chills, diaphoresis and fever.  HENT: Negative for congestion and sore throat.   Eyes: Negative for blurred vision.  Respiratory: Negative for cough, shortness of breath and wheezing.   Cardiovascular: Negative for chest pain and palpitations.  Gastrointestinal: Negative for diarrhea, heartburn, nausea and vomiting.  Genitourinary: Negative for dysuria.  Musculoskeletal: Negative for myalgias.  Skin: Negative for itching and rash.  Neurological: Negative for dizziness, tremors, seizures, weakness and headaches.  Endo/Heme/Allergies: Negative for environmental allergies. Does not bruise/bleed easily.        Allergies: NKDA  Psychiatric/Behavioral: Positive for depression and substance abuse (Hx. Cocaine use disorder). Negative for hallucinations, memory loss and suicidal ideas. The patient has insomnia. The patient is not nervous/anxious (Stable).     Blood pressure 132/89, pulse 98, temperature 98.5 F (36.9 C), temperature source Oral, resp. rate 16, height '5\' 2"'  (1.575 m), weight 57.6 kg, SpO2 99 %.Body mass index is 23.23 kg/m.  General Appearance: improving grooming   Eye Contact:  Fair, tends to improve during session, has improved since admission  Speech:  normal  Volume:  Normal  Mood:  remains depressed    Affect: constricted, vaguely irritable, but tends to improve during session  Thought Process:  linear and Descriptions of Associations: Intact  Orientation:  fully alert and attentive  Thought Content:  no hallucinations at this time, no  delusions expressed , reports vague feelings of paranoia, with a subjective feeling she is " being watched", preserved reality testing   Suicidal Thoughts: denies SI ro self injurious ideations at this time  Homicidal Thoughts:  Denies  Memory: recent and remote grossly intact   Judgement:  Fair/ improving   Insight:  fair   Psychomotor Activity: Normal- no psychomotor agitation  Concentration:  Concentration: improving and Attention Span: improving   Recall:  good   Fund of Knowledge:  good   Language:  Good  Akathisia:  Negative  Handed:  Right  AIMS (if indicated):     Assets:  Communication Skills Desire for Improvement Physical Health  ADL's:  Intact  Cognition:  WNL    Sleep:  Number of Hours: 5.75    Assessment: 89 y old female, presented for worsening depression, affective lability, suicidal thoughts of " running out into the street", neuro-vegetative symptoms including anhedonia, decreased appetite and decreased energy level, intermittent visual hallucinations , of seeing figures/shadows. She had relapsed on cocaine several  months ago, and was using this substance 3-4 x week. Also reported ongoing grief related to the deaths of two of her children ( 2013, 2017) . Past history of Bipolar Disorder diagnosis, and endorses episodes of mood instability,  irritability, anger.  Patient remains depressed and vaguely dysphoric. Some vague self referential ideations but no overt psychotic symptoms at this time and denies hallucinations/does not appear internally preoccupied at this time. Denies SI, contracts for safety on unit. Tolerating Abilify/Effexor XR well thus far . HgbA1C 6.4 - recommendation for follow up with PCP for monitoring and diet . Will order dietary consultation for education/advice on dietary changes .   Treatment Plan/Recommendations:  Treatment Plan reviewed as below today 6/13 Encourage group and milieu participation Encourage efforts to work on sobriety , relapse prevention Treatment team working on disposition planning options Increase Abilify to 15 mgrs QDAY for mood disorder Continue  Effexor XR  75 mgrs QDAY for depression, anxiety Continue Vistaril 25 mgrs Q 6 hours PRN for anxiety Continue Trazodone 50 mgrs QHS PRN for insomnia  Jenne Campus, MD 01/01/2020, 2:22 PM   Patient ID: Sheri Hall, female   DOB: [birth year], 49 y.o.   MRN: [id number]

## 2020-01-01 NOTE — BHH Group Notes (Signed)
Adult Psychoeducational Group Note  Date:  01/01/2020 Time:  2:37 PM  Group Topic/Focus:  Making Healthy Choices:   The focus of this group is to help patients identify negative/unhealthy choices they were using prior to admission and identify positive/healthier coping strategies to replace them upon discharge.  Participation Level:  Active  Participation Quality:  Appropriate  Affect:  Appropriate  Cognitive:  Oriented  Insight: Appropriate  Engagement in Group:  Engaged  Modes of Intervention:  Discussion and Support  Additional Comments:  Pt rates her energy as a 3. Pt participated in the group fully.  Dione Housekeeper 01/01/2020, 2:37 PM

## 2020-01-02 MED ORDER — LORAZEPAM 0.5 MG PO TABS
0.5000 mg | ORAL_TABLET | Freq: Three times a day (TID) | ORAL | Status: DC | PRN
  Administered 2020-01-04: 0.5 mg via ORAL
  Filled 2020-01-02: qty 1

## 2020-01-02 MED ORDER — LORAZEPAM 0.5 MG PO TABS
0.5000 mg | ORAL_TABLET | Freq: Four times a day (QID) | ORAL | Status: DC | PRN
  Administered 2020-01-02: 0.5 mg via ORAL
  Filled 2020-01-02: qty 1

## 2020-01-02 NOTE — Progress Notes (Signed)
   01/02/20 1057  Psych Admission Type (Psych Patients Only)  Admission Status Voluntary  Psychosocial Assessment  Patient Complaints Anxiety;Depression  Eye Contact Brief  Facial Expression Flat;Sad  Affect Depressed;Sad;Labile  Speech Logical/coherent;Soft  Interaction Minimal;Guarded;Superficial  Motor Activity Other (Comment) (WNL)  Appearance/Hygiene Unremarkable  Behavior Characteristics Cooperative  Mood Anxious;Depressed  Thought Process  Coherency WDL  Content WDL  Delusions None reported or observed  Perception WDL  Hallucination None reported or observed  Judgment Poor  Confusion None  Danger to Self  Current suicidal ideation? Denies  Danger to Others  Danger to Others None reported or observed

## 2020-01-02 NOTE — Progress Notes (Signed)
Patient ID: Sheri Hall, female   DOB: August 20, 1970, 49 y.o.   MRN: [id number] [facility] MD Progress Note  01/02/2020 12:18 PM Sheri Hall  MRN:  [id number]  Subjective: Patient reports "I am feeling a little better I think".  She does continue to endorse depression subjective irritability.  Denies suicidal ideations.  Describes feeling subjectively overmedicated/sedated on Vistaril as needed  Objective: I have discussed case with treatment team and have met with patient. 49 y old female, presented for worsening depression, affective lability, suicidal thoughts of " running out into the street", neuro-vegetative symptoms including anhedonia, decreased appetite and decreased energy level, intermittent visual hallucinations , of seeing figures/shadows. She had relapsed on cocaine several months ago, and was using this substance 3-4 x week. Also reported ongoing grief related to the deaths of two of her children ( 2013, 2017) . Past history of Bipolar Disorder diagnosis, and endorses episodes of mood instability,  irritability, anger.  Patient presents alert, attentive, calm and in no acute distress, cooperative. No disruptive or agitated behaviors She reports some improvement compared to admission but describes ongoing depression.  She continues to present with a constricted/ dysphoric affect and is vaguely irritable although to a lesser degree than on admission. Currently denies suicidal ideations and contracts for safety on unit. At her request and with her expressed consent I spoke with her mother via phone.  States "my mother has been worried about me".  Mother corroborates the patient seems partially improved compared to her admission presentation. Limited milieu participation.   Principal Problem: Bipolar I disorder, most recent episode mixed ([place])  Diagnosis:   Patient Active Problem List   Diagnosis Date Noted  . Bipolar I disorder, most recent episode mixed ([place]) [F31.60] 12/29/2019  .  Major depressive disorder, recurrent episode ([place]) [F33.9] 12/28/2019  . Lobar pneumonia ([place]) [J18.1] 07/17/2018  . Normocytic anemia [D64.9] 07/17/2018  . Cocaine use [F14.90] 07/17/2018  . Rheumatoid arthritis ([place]) [M06.9] 07/17/2018  . Sepsis ([place]) [A41.9] 07/16/2018  . PTSD (post-traumatic stress disorder) [F43.10] 09/17/2017  . Substance use disorder [F19.90] 09/17/2017  . Grief [F43.21] 09/17/2017  . MDD (major depressive disorder), severe ([place]) [F32.2] 09/16/2017   Total Time spent with patient: 20 minutes  Past Psychiatric History: See H&P  Past Medical History:  Past Medical History:  Diagnosis Date  . Anxiety   . Arthritis    rheumatoid   . Asthma    seasonal  . Bipolar disorder ([place])   . Depression   . GERD (gastroesophageal reflux disease)   . OCD (obsessive compulsive disorder)   . PTSD (post-traumatic stress disorder)     Past Surgical History:  Procedure Laterality Date  . CESAREAN SECTION    . OPEN REDUCTION INTERNAL FIXATION (ORIF) DISTAL RADIAL FRACTURE Left 10/15/2015   Procedure: OPEN TREATMENT OF LEFT DISTAL RADIUS FRACTURE;  Surgeon: Milly Jakob, MD;  Location: [place];  Service: Orthopedics;  Laterality: Left;   Family History:  Family History  Family history unknown: Yes   Family Psychiatric  History: See H&P Social History:  Social History   Substance and Sexual Activity  Alcohol Use Yes   Comment: occ; liquor     Social History   Substance and Sexual Activity  Drug Use Yes  . Types: Cocaine, "Crack" cocaine   Comment: wednesday March 22nd 2017    Social History   Socioeconomic History  . Marital status: Single    Spouse name: Not on file  . Number of children: Not on  file  . Years of education: Not on file  . Highest education level: Not on file  Occupational History  . Not on file  Tobacco Use  . Smoking status: Current Every Day Smoker    Packs/day: 0.50    Types: Cigarettes  . Smokeless tobacco:  Never Used  Vaping Use  . Vaping Use: Never used  Substance and Sexual Activity  . Alcohol use: Yes    Comment: occ; liquor  . Drug use: Yes    Types: Cocaine, "Crack" cocaine    Comment: wednesday March 22nd 2017  . Sexual activity: Yes    Birth control/protection: I.U.D.    Comment: Mirena  Other Topics Concern  . Not on file  Social History Narrative  . Not on file   Social Determinants of Health   Financial Resource Strain:   . Difficulty of Paying Living Expenses:   Food Insecurity:   . Worried About Charity fundraiser in the Last Year:   . Arboriculturist in the Last Year:   Transportation Needs:   . Film/video editor (Medical):   Marland Kitchen Lack of Transportation (Non-Medical):   Physical Activity:   . Days of Exercise per Week:   . Minutes of Exercise per Session:   Stress:   . Feeling of Stress :   Social Connections:   . Frequency of Communication with Friends and Family:   . Frequency of Social Gatherings with Friends and Family:   . Attends Religious Services:   . Active Member of Clubs or Organizations:   . Attends Archivist Meetings:   Marland Kitchen Marital Status:    Additional Social History:   Sleep: Fair/ improving   Appetite:  Fair  Current Medications: Current Facility-Administered Medications  Medication Dose Route Frequency Provider Last Rate Last Admin  . acetaminophen (TYLENOL) tablet 650 mg  650 mg Oral Q6H PRN Lindell Spar I, NP   650 mg at 12/28/19 1847  . ARIPiprazole (ABILIFY) tablet 15 mg  15 mg Oral Daily Shantele Reller, Myer Peer, MD   15 mg at 01/02/20 0815  . feeding supplement (ENSURE ENLIVE) (ENSURE ENLIVE) liquid 237 mL  237 mL Oral BID BM Jazion Atteberry, Myer Peer, MD   237 mL at 01/02/20 0932  . ibuprofen (ADVIL) tablet 400 mg  400 mg Oral Q6H PRN Zakry Caso, Myer Peer, MD   400 mg at 01/02/20 1207  . LORazepam (ATIVAN) tablet 0.5 mg  0.5 mg Oral Q6H PRN Amey Hossain, Myer Peer, MD   0.5 mg at 01/02/20 1207  . pantoprazole (PROTONIX) EC tablet 40 mg  40  mg Oral Daily Marquavius Scaife, Myer Peer, MD   40 mg at 01/02/20 0815  . traZODone (DESYREL) tablet 50 mg  50 mg Oral QHS PRN Daaron Dimarco, Myer Peer, MD      . venlafaxine XR (EFFEXOR-XR) 24 hr capsule 75 mg  75 mg Oral Q breakfast Akiera Allbaugh, Myer Peer, MD   75 mg at 01/02/20 0815  . ziprasidone (GEODON) injection 20 mg  20 mg Intramuscular Once Lindell Spar I, NP       Lab Results:  Results for orders placed or performed during the hospital encounter of 12/28/19 (from the past 48 hour(s))  TSH     Status: None   Collection Time: 12/31/19  6:12 PM  Result Value Ref Range   TSH 0.605 0.350 - 4.500 uIU/mL    Comment: Performed by a 3rd Generation assay with a functional sensitivity of <=0.01 uIU/mL. Performed at [facility], [place] [address], [place], [place] [address]   Lipid panel     Status: None   Collection Time: 12/31/19  6:12 PM  Result Value Ref Range   Cholesterol 160 0 - 200 mg/dL   Triglycerides 122 <150 mg/dL   HDL 48 >40 mg/dL   Total CHOL/HDL Ratio 3.3 RATIO   VLDL 24 0 - 40 mg/dL   LDL Cholesterol 88 0 - 99 mg/dL    Comment:        Total Cholesterol/HDL:CHD Risk Coronary Heart Disease Risk Table                     Men   Women  1/2 Average Risk   3.4   3.3  Average Risk       5.0   4.4  2 X Average Risk   9.6   7.1  3 X Average Risk  23.4   11.0        Use the calculated Patient Ratio above and the CHD Risk Table to determine the patient's CHD Risk.        ATP III CLASSIFICATION (LDL):  <100     mg/dL   Optimal  100-129  mg/dL   Near or Above                    Optimal  130-159  mg/dL   Borderline  160-189  mg/dL   High  >190     mg/dL   Very High Performed at [facility] [address] [place], [place] [address]   Hemoglobin A1c     Status: Abnormal   Collection Time: 12/31/19  6:12 PM  Result Value Ref Range   Hgb A1c MFr Bld 6.4 (H) 4.8 - 5.6 %    Comment: (NOTE) Pre diabetes:          5.7%-6.4%  Diabetes:               >6.4%  Glycemic control for   <7.0% adults with diabetes    Mean Plasma Glucose 136.98 mg/dL    Comment: Performed at [facility] [address], [place], [place] [address]  CBC with Differential/Platelet     Status: Abnormal   Collection Time: 12/31/19  6:12 PM  Result Value Ref Range   WBC 5.8 4.0 - 10.5 K/uL   RBC 3.87 3.87 - 5.11 MIL/uL   Hemoglobin 10.9 (L) 12.0 - 15.0 g/dL   HCT 34.8 (L) 36 - 46 %   MCV 89.9 80.0 - 100.0 fL   MCH 28.2 26.0 - 34.0 pg   MCHC 31.3 30.0 - 36.0 g/dL   RDW 16.7 (H) 11.5 - 15.5 %   Platelets 302 150 - 400 K/uL   nRBC 0.0 0.0 - 0.2 %   Neutrophils Relative % 41 %   Neutro Abs 2.4 1.7 - 7.7 K/uL   Lymphocytes Relative 49 %   Lymphs Abs 2.8 0.7 - 4.0 K/uL   Monocytes Relative 8 %   Monocytes Absolute 0.5 0 - 1 K/uL   Eosinophils Relative 2 %   Eosinophils Absolute 0.1 0 - 0 K/uL   Basophils Relative 0 %   Basophils Absolute 0.0 0 - 0 K/uL   Immature Granulocytes 0 %   Abs Immature Granulocytes 0.01 0.00 - 0.07 K/uL    Comment: Performed at [facility], [place] [address], [place],  [address]    Blood Alcohol level:  Lab Results  Component Value Date   ETH <10 12/27/2019   ETH <10 96/22/2979   Metabolic Disorder Labs: Lab Results  Component Value Date   HGBA1C 6.4 (H) 12/31/2019   MPG 136.98 12/31/2019   No results found for: PROLACTIN Lab Results  Component Value Date   CHOL 160 12/31/2019   TRIG 122 12/31/2019   HDL 48 12/31/2019   CHOLHDL 3.3 12/31/2019   VLDL 24 12/31/2019   LDLCALC 88 12/31/2019    Physical Findings: AIMS: Facial and Oral Movements Muscles of Facial Expression: None, normal Lips and Perioral Area: None, normal Jaw: None, normal Tongue: None, normal,Extremity Movements Upper (arms, wrists, hands, fingers): None, normal Lower (legs, knees, ankles, toes): None, normal, Trunk Movements Neck, shoulders, hips: None, normal, Overall Severity Severity of abnormal movements  (highest score from questions above): None, normal Incapacitation due to abnormal movements: None, normal Patient's awareness of abnormal movements (rate only patient's report): No Awareness, Dental Status Current problems with teeth and/or dentures?: No Does patient usually wear dentures?: No  CIWA:    COWS:     Musculoskeletal: Strength & Muscle Tone: within normal limits Gait & Station: normal Patient leans: N/A  Psychiatric Specialty Exam: Physical Exam  Constitutional: She is oriented to person, place, and time. She appears well-developed.  HENT:  Head: Normocephalic and atraumatic.  Mouth/Throat: Oropharynx is clear.  Eyes: Pupils are equal, round, and reactive to light.  Cardiovascular: Normal rate.  Respiratory: Effort normal. No respiratory distress. She has no wheezes. She exhibits no tenderness.  Genitourinary:    Genitourinary Comments: Deferred   Musculoskeletal:        General: Normal range of motion.     Cervical back: Normal range of motion.  Neurological: She is alert and oriented to person, place, and time.  Skin: Skin is warm and dry.  Psychiatric:  As above     Review of Systems  Constitutional: Negative for chills, diaphoresis and fever.  HENT: Negative for congestion and sore throat.   Eyes: Negative for blurred vision.  Respiratory: Negative for cough, shortness of breath and wheezing.   Cardiovascular: Negative for chest pain and palpitations.  Gastrointestinal: Negative for diarrhea, heartburn, nausea and vomiting.  Genitourinary: Negative for dysuria.  Musculoskeletal: Negative for myalgias.  Skin: Negative for itching and rash.  Neurological: Negative for dizziness, tremors, seizures, weakness and headaches.  Endo/Heme/Allergies: Negative for environmental allergies. Does not bruise/bleed easily.       Allergies: NKDA  Psychiatric/Behavioral: Positive for depression and substance abuse (Hx. Cocaine use disorder). Negative for hallucinations,  memory loss and suicidal ideas. The patient has insomnia. The patient is not nervous/anxious (Stable).   No chest pain, no shortness of breath, no cough, no vomiting  Blood pressure 132/89, pulse 98, temperature 98.5 F (36.9 C), temperature source Oral, resp. rate 16, height '5\' 2"'  (1.575 m), weight 57.6 kg, SpO2 99 %.Body mass index is 23.23 kg/m.  General Appearance: improving grooming   Eye Contact:   Improving  Speech:  normal  Volume:  Normal  Mood:   Some improvement but overall continues to feel depressed and irritable  Affect:  Remains constricted/dysphoric.  Does smile briefly at times during session  Thought Process:  linear and Descriptions of Associations: Intact  Orientation:  fully alert and attentive  Thought Content:   No hallucinations, no delusions noted, not internally preoccupied, appears less guarded today  Suicidal Thoughts: denies SI ro self injurious ideations at this time  Homicidal Thoughts:  Denies  Memory:  recent and remote grossly intact   Judgement:  Fair/ improving   Insight:  fair /improving  Psychomotor Activity: Normal- no psychomotor agitation  Concentration:  Concentration: improving and Attention Span: improving   Recall:  good   Fund of Knowledge:  good   Language:  Good  Akathisia:  Negative  Handed:  Right  AIMS (if indicated):     Assets:  Communication Skills Desire for Improvement Physical Health  ADL's:  Intact  Cognition:  WNL    Sleep:  Number of Hours: 6.25    Assessment: 46 y old female, presented for worsening depression, affective lability, suicidal thoughts of " running out into the street", neuro-vegetative symptoms including anhedonia, decreased appetite and decreased energy level, intermittent visual hallucinations , of seeing figures/shadows. She had relapsed on cocaine several months ago, and was using this substance 3-4 x week. Also reported ongoing grief related to the deaths of two of her children ( 2013, 2017) . Past  history of Bipolar Disorder diagnosis, and endorses episodes of mood instability,  irritability, anger.  Patient endorses some improvement but describes persistent depression and a subjective feeling of irritability.  Affect remains constricted and vaguely dysphoric.  Does appear less labile today.  Eye contact, volume and rate of speech noted to be improved.  Currently not endorsing psychotic symptoms nor does she appear internally preoccupied.  Tolerating medications well thus far although she states that Vistaril caused her to feel overmedicated and "groggy"  Treatment Plan/Recommendations:  Treatment Plan reviewed as below today 6/14 Encourage group and milieu participation Encourage efforts to work on sobriety , relapse prevention Treatment team working on disposition planning options Continue Abilify  15 mgrs QDAY for mood disorder Continue  Effexor XR  75 mgrs QDAY for depression, anxiety Discontinue Vistaril -see above Start Ativan 0.5 mg every 8 hours as needed for anxiety Continue Trazodone 50 mgrs QHS PRN for insomnia  Jenne Campus, MD 01/02/2020, 12:18 PM   Patient ID: Sheri Hall, female   DOB: 06/11/1971, 49 y.o.   MRN: [id number]

## 2020-01-02 NOTE — [facility] Group Notes (Signed)
LCSW Group Therapy Note 01/02/2020 3:02 PM  Type of Therapy and Topic: Group Therapy: Overcoming Obstacles  Participation Level: Did Not Attend  Description of Group:  In this group patients will be encouraged to explore what they see as obstacles to their own wellness and recovery. They will be guided to discuss their thoughts, feelings, and behaviors related to these obstacles. The group will process together ways to cope with barriers, with attention given to specific choices patients can make. Each patient will be challenged to identify changes they are motivated to make in order to overcome their obstacles. This group will be process-oriented, with patients participating in exploration of their own experiences as well as giving and receiving support and challenge from other group members.  Therapeutic Goals: 1. Patient will identify personal and current obstacles as they relate to admission. 2. Patient will identify barriers that currently interfere with their wellness or overcoming obstacles.  3. Patient will identify feelings, thought process and behaviors related to these barriers. 4. Patient will identify two changes they are willing to make to overcome these obstacles:   Summary of Patient Progress  Invited, chose not to attend.    Therapeutic Modalities:  Cognitive Behavioral Therapy Solution Focused Therapy Motivational Interviewing Relapse Prevention Therapy   Alcario Drought Clinical Social Worker

## 2020-01-02 NOTE — Progress Notes (Signed)
Recreation Therapy Notes  Date: 6.14.21 Time: 0930 Location: [address] Dayroom  Group Topic: Wellness  Goal Area(s) Addresses:  Patient will define components of whole wellness. Patient will verbalize benefit of whole wellness.  Intervention: Stress Management  Activity: Guided Imagery.  LRT read a script that took patients on a journey through a peaceful meadow to enjoy the sights and sounds of nature.  Education: Wellness, Discharge Planning.   Education Outcome: Acknowledges education/In group clarification offered/Needs additional education.   Clinical Observations/Feedback: Pt did not attend group activity.     Navya Timmons, LRT/CTRS         Jeramie Scogin A 01/02/2020 11:39 AM 

## 2020-01-03 MED ORDER — ADULT MULTIVITAMIN W/MINERALS CH
1.0000 | ORAL_TABLET | Freq: Every day | ORAL | Status: DC
  Administered 2020-01-03 – 2020-01-04 (×2): 1 via ORAL
  Filled 2020-01-03 (×4): qty 1

## 2020-01-03 NOTE — Progress Notes (Signed)
   01/02/20 2054  Psych Admission Type (Psych Patients Only)  Admission Status Voluntary  Psychosocial Assessment  Patient Complaints Anxiety  Eye Contact Brief  Facial Expression Flat;Sad  Affect Depressed;Sad;Labile  Speech Logical/coherent;Soft  Interaction Minimal;Guarded;Superficial  Motor Activity Other (Comment) (WNL)  Appearance/Hygiene Unremarkable  Behavior Characteristics Cooperative  Mood Anxious  Thought Process  Coherency WDL  Content WDL  Delusions None reported or observed  Perception WDL  Hallucination None reported or observed  Judgment Poor  Confusion None  Danger to Self  Current suicidal ideation? Denies  Danger to Others  Danger to Others None reported or observed

## 2020-01-03 NOTE — Plan of Care (Signed)
Nurse discussed anxiety, depression and coping skills with patient.  

## 2020-01-03 NOTE — Progress Notes (Signed)
Recreation Therapy Notes  Animal-Assisted Activity (AAA) Program Checklist/Progress Notes Patient Eligibility Criteria Checklist & Daily Group note for Rec Tx Intervention  Date: 6.15.21 Time: 1430 Location: [address]   AAA/T Program Assumption of Risk Form signed by Engineer, production or Parent Legal Guardian  YES  Patient is free of allergies or sever asthma  YES  Patient reports no fear of animals  YES  Patient reports no history of cruelty to animals YES  Patient understands his/her participation is voluntary YES  Patient washes hands before animal contact  YES  Patient washes hands after animal contact  YES  Education: Charity fundraiser, Appropriate Animal Interaction   Education Outcome: Acknowledges understanding/In group clarification offered/Needs additional education.   Clinical Observations/Feedback: Pt did not attend activity.    Caroll Rancher, LRT/CTRS         Caroll Rancher A 01/03/2020 3:51 PM

## 2020-01-03 NOTE — Progress Notes (Signed)
The patient expressed in group that she spent less time in bed today. Her goal for tomorrow is to spend even more time in the dayroom. She also stated that she was able to get a good laugh today by talking on the phone.

## 2020-01-03 NOTE — Progress Notes (Signed)
NUTRITION ASSESSMENT  RD consulted for nutritional assessment.  INTERVENTION: 1. Supplements: Ensure Enlive po BID, each supplement provides 350 kcal and 20 grams of protein 2. Multivitamin with minerals daily  NUTRITION DIAGNOSIS: Unintentional weight loss related to sub-optimal intake as evidenced by pt report.   Goal: Pt to meet >/= 90% of their estimated nutrition needs.  Monitor:  PO intake  Assessment:  Pt admitted with bipolar disorder and depression. Pt has been using cocaine daily as well. Reports her appetite has been poor. MD has ordered pt Ensure supplements, will continue as pt is accepting these. Will add additional daily MVI given substance abuse.  Per weight records, pt weighed 135 lbs in 2019. Pt weighing 127 lbs this admission.  Height: Ht Readings from Last 1 Encounters:  12/28/19 5\' 2"  (1.575 m)    Weight: Wt Readings from Last 1 Encounters:  12/28/19 57.6 kg    Weight Hx: Wt Readings from Last 10 Encounters:  12/28/19 57.6 kg  07/16/18 55.2 kg  09/16/17 61.7 kg  10/15/15 64.9 kg  10/01/15 63 kg  06/19/15 54.4 kg  03/18/15 54.4 kg    BMI:  Body mass index is 23.23 kg/m. Pt meets criteria for normal based on current BMI.  Estimated Nutritional Needs: Kcal: 25-30 kcal/kg Protein: > 1 gram protein/kg Fluid: 1 ml/kcal  Diet Order:  Diet Order            Diet heart healthy/carb modified Room service appropriate? Yes; Fluid consistency: Thin  Diet effective now                Pt is also offered choice of unit snacks mid-morning and mid-afternoon.    Lab results and medications reviewed.   03/20/15, MS, RD, LDN Inpatient Clinical Dietitian Contact information available via Amion

## 2020-01-03 NOTE — Progress Notes (Signed)
Patient stated she would like to have mucinex for her sinus problems.

## 2020-01-03 NOTE — Progress Notes (Signed)
D:  Patient's self inventory sheet, patient has poor sleep, no sleep medication.  Fair appetite, low energy level, good concentration.  Rated depression 3, anxiety 4, denied hopeless.  Denied withdrawals.  Denied SI.  Physical problems, lightheaded, dizziness, headaches.  Does have discharge plans. A:  Medications administered per MD orders.  Emotional support and encouragement given patient. R:  Denied SI and HI, contracts for safety.  Denied A/V hallucinations.  Safety maintained with 15 minute checks.

## 2020-01-03 NOTE — Progress Notes (Signed)
Patient ID: Sheri Hall, female   DOB: [birth year], 49 y.o.   MRN: [id number] [facility] MD Progress Note  01/03/2020 1:45 PM Sheri Hall  MRN:  [id number]  Subjective: Describes some improvement compared to admission. Denies SI . Does not endorse medication side effects.  Objective: I have discussed case with treatment team and have met with patient. 49 y old female, presented for worsening depression, affective lability, suicidal thoughts of " running out into the street", neuro-vegetative symptoms including anhedonia, decreased appetite and decreased energy level, intermittent visual hallucinations , of seeing figures/shadows. She had relapsed on cocaine several months ago, and was using this substance 3-4 x week. Also reported ongoing grief related to the deaths of two of her children ( 2013, 2017) . Past history of Bipolar Disorder diagnosis, and endorses episodes of mood instability,  irritability, anger.  Describes partial improvement and acknowledges feeling better than on admission. She does remain constricted in affect, which tends to improve partially during session. Denies suicidal ideations. Continues to endorse some neuro-vegetative symptoms such as residual anhedonia, erratic sleep. She denies medication side effects. Today does expand more on stressors and conflicts that have contributed to depression. States she is originally from [place] , [place], but moved to [place] because of the death ( in [place]) of two of her children. She has other surviving adult children living there and states this is a source of stress for her and she worries about them .  No disruptive or agitated behaviors on unit . Limited group /milieu participation.    Principal Problem: Bipolar I disorder, most recent episode mixed ([place])  Diagnosis:   Patient Active Problem List   Diagnosis Date Noted  . Bipolar I disorder, most recent episode mixed ([place]) [F31.60] 12/29/2019  . Major depressive disorder,  recurrent episode ([place]) [F33.9] 12/28/2019  . Lobar pneumonia ([place]) [J18.1] 07/17/2018  . Normocytic anemia [D64.9] 07/17/2018  . Cocaine use [F14.90] 07/17/2018  . Rheumatoid arthritis ([place]) [M06.9] 07/17/2018  . Sepsis ([place]) [A41.9] 07/16/2018  . PTSD (post-traumatic stress disorder) [F43.10] 09/17/2017  . Substance use disorder [F19.90] 09/17/2017  . Grief [F43.21] 09/17/2017  . MDD (major depressive disorder), severe ([place]) [F32.2] 09/16/2017   Total Time spent with patient: 20 minutes  Past Psychiatric History: See H&P  Past Medical History:  Past Medical History:  Diagnosis Date  . Anxiety   . Arthritis    rheumatoid   . Asthma    seasonal  . Bipolar disorder ([place])   . Depression   . GERD (gastroesophageal reflux disease)   . OCD (obsessive compulsive disorder)   . PTSD (post-traumatic stress disorder)     Past Surgical History:  Procedure Laterality Date  . CESAREAN SECTION    . OPEN REDUCTION INTERNAL FIXATION (ORIF) DISTAL RADIAL FRACTURE Left 10/15/2015   Procedure: OPEN TREATMENT OF LEFT DISTAL RADIUS FRACTURE;  Surgeon: Milly Jakob, MD;  Location: [facility];  Service: Orthopedics;  Laterality: Left;   Family History:  Family History  Family history unknown: Yes   Family Psychiatric  History: See H&P Social History:  Social History   Substance and Sexual Activity  Alcohol Use Yes   Comment: occ; liquor     Social History   Substance and Sexual Activity  Drug Use Yes  . Types: Cocaine, "Crack" cocaine   Comment: wednesday March 22nd 2017    Social History   Socioeconomic History  . Marital status: Single    Spouse name: Not on file  . Number of children:  Not on file  . Years of education: Not on file  . Highest education level: Not on file  Occupational History  . Not on file  Tobacco Use  . Smoking status: Current Every Day Smoker    Packs/day: 0.50    Types: Cigarettes  . Smokeless tobacco: Never Used  Vaping Use  .  Vaping Use: Never used  Substance and Sexual Activity  . Alcohol use: Yes    Comment: occ; liquor  . Drug use: Yes    Types: Cocaine, "Crack" cocaine    Comment: wednesday March 22nd 2017  . Sexual activity: Yes    Birth control/protection: I.U.D.    Comment: Mirena  Other Topics Concern  . Not on file  Social History Narrative  . Not on file   Social Determinants of Health   Financial Resource Strain:   . Difficulty of Paying Living Expenses:   Food Insecurity:   . Worried About Charity fundraiser in the Last Year:   . Arboriculturist in the Last Year:   Transportation Needs:   . Film/video editor (Medical):   Marland Kitchen Lack of Transportation (Non-Medical):   Physical Activity:   . Days of Exercise per Week:   . Minutes of Exercise per Session:   Stress:   . Feeling of Stress :   Social Connections:   . Frequency of Communication with Friends and Family:   . Frequency of Social Gatherings with Friends and Family:   . Attends Religious Services:   . Active Member of Clubs or Organizations:   . Attends Archivist Meetings:   Marland Kitchen Marital Status:    Additional Social History:   Sleep: Fair/ improving   Appetite:  Fair  Current Medications: Current Facility-Administered Medications  Medication Dose Route Frequency Provider Last Rate Last Admin  . acetaminophen (TYLENOL) tablet 650 mg  650 mg Oral Q6H PRN Lindell Spar I, NP   650 mg at 12/28/19 1847  . ARIPiprazole (ABILIFY) tablet 15 mg  15 mg Oral Daily Sierra Spargo, Myer Peer, MD   15 mg at 01/03/20 0739  . feeding supplement (ENSURE ENLIVE) (ENSURE ENLIVE) liquid 237 mL  237 mL Oral BID BM Trixy Loyola, Myer Peer, MD   237 mL at 01/03/20 1100  . ibuprofen (ADVIL) tablet 400 mg  400 mg Oral Q6H PRN Laiylah Roettger, Myer Peer, MD   400 mg at 01/03/20 0741  . LORazepam (ATIVAN) tablet 0.5 mg  0.5 mg Oral Q8H PRN Leianna Barga, Myer Peer, MD      . multivitamin with minerals tablet 1 tablet  1 tablet Oral Daily Val Farnam, Myer Peer, MD   1  tablet at 01/03/20 1330  . pantoprazole (PROTONIX) EC tablet 40 mg  40 mg Oral Daily Cambell Stanek, Myer Peer, MD   40 mg at 01/03/20 0739  . traZODone (DESYREL) tablet 50 mg  50 mg Oral QHS PRN Omega Slager, Myer Peer, MD      . venlafaxine XR (EFFEXOR-XR) 24 hr capsule 75 mg  75 mg Oral Q breakfast Artrell Lawless, Myer Peer, MD   75 mg at 01/03/20 0739  . ziprasidone (GEODON) injection 20 mg  20 mg Intramuscular Once Lindell Spar I, NP       Lab Results:  No results found for this or any previous visit (from the past 24 hour(s)).  Blood Alcohol level:  Lab Results  Component Value Date   [facility] <10 12/27/2019   ETH <10 35/32/9924   Metabolic Disorder Labs: Lab Results  Component  Value Date   HGBA1C 6.4 (H) 12/31/2019   MPG 136.98 12/31/2019   No results found for: PROLACTIN Lab Results  Component Value Date   CHOL 160 12/31/2019   TRIG 122 12/31/2019   HDL 48 12/31/2019   CHOLHDL 3.3 12/31/2019   VLDL 24 12/31/2019   LDLCALC 88 12/31/2019    Physical Findings: AIMS: Facial and Oral Movements Muscles of Facial Expression: None, normal Lips and Perioral Area: None, normal Jaw: None, normal Tongue: None, normal,Extremity Movements Upper (arms, wrists, hands, fingers): None, normal Lower (legs, knees, ankles, toes): None, normal, Trunk Movements Neck, shoulders, hips: None, normal, Overall Severity Severity of abnormal movements (highest score from questions above): None, normal Incapacitation due to abnormal movements: None, normal Patient's awareness of abnormal movements (rate only patient's report): No Awareness, Dental Status Current problems with teeth and/or dentures?: No Does patient usually wear dentures?: No  CIWA:    COWS:     Musculoskeletal: Strength & Muscle Tone: within normal limits Gait & Station: normal Patient leans: N/A  Psychiatric Specialty Exam: Physical Exam  Constitutional: She is oriented to person, place, and time. She appears well-developed.  HENT:  Head:  Normocephalic and atraumatic.  Mouth/Throat: Oropharynx is clear.  Eyes: Pupils are equal, round, and reactive to light.  Cardiovascular: Normal rate.  Respiratory: Effort normal. No respiratory distress. She has no wheezes. She exhibits no tenderness.  Genitourinary:    Genitourinary Comments: Deferred   Musculoskeletal:        General: Normal range of motion.     Cervical back: Normal range of motion.  Neurological: She is alert and oriented to person, place, and time.  Skin: Skin is warm and dry.  Psychiatric:  As above     Review of Systems  Constitutional: Negative for chills, diaphoresis and fever.  HENT: Negative for congestion and sore throat.   Eyes: Negative for blurred vision.  Respiratory: Negative for cough, shortness of breath and wheezing.   Cardiovascular: Negative for chest pain and palpitations.  Gastrointestinal: Negative for diarrhea, heartburn, nausea and vomiting.  Genitourinary: Negative for dysuria.  Musculoskeletal: Negative for myalgias.  Skin: Negative for itching and rash.  Neurological: Negative for dizziness, tremors, seizures, weakness and headaches.  Endo/Heme/Allergies: Negative for environmental allergies. Does not bruise/bleed easily.       Allergies: NKDA  Psychiatric/Behavioral: Positive for depression and substance abuse (Hx. Cocaine use disorder). Negative for hallucinations, memory loss and suicidal ideas. The patient has insomnia. The patient is not nervous/anxious (Stable).   No chest pain, no shortness of breath, no cough, no vomiting  Blood pressure 109/77, pulse 91, temperature 98.1 F (36.7 C), temperature source Oral, resp. rate 16, height '5\' 2"'  (1.575 m), weight 57.6 kg, SpO2 99 %.Body mass index is 23.23 kg/m.  General Appearance: improving grooming   Eye Contact:   Improving  Speech:  normal  Volume:  Normal  Mood:   Partially improved mood, remains depressed and vaguely dysphoric in affect   Affect:  congruent, improves  partially during session  Thought Process:  linear and Descriptions of Associations: Intact  Orientation:  fully alert and attentive  Thought Content:   No hallucinations, no delusions noted, not internally preoccupied, appears less guarded today  Suicidal Thoughts: denies SI ro self injurious ideations at this time  Homicidal Thoughts:  Denies  Memory: recent and remote grossly intact   Judgement:  Fair/ improving   Insight:  fair /improving  Psychomotor Activity: Normal- no psychomotor agitation  Concentration:  Concentration: improving  and Attention Span: improving   Recall:  good   Fund of Knowledge:  good   Language:  Good  Akathisia:  Negative  Handed:  Right  AIMS (if indicated):     Assets:  Communication Skills Desire for Improvement Physical Health  ADL's:  Intact  Cognition:  WNL    Sleep:  Number of Hours: 6.25    Assessment: 33 y old female, presented for worsening depression, affective lability, suicidal thoughts of " running out into the street", neuro-vegetative symptoms including anhedonia, decreased appetite and decreased energy level, intermittent visual hallucinations , of seeing figures/shadows. She had relapsed on cocaine several months ago, and was using this substance 3-4 x week. Also reported ongoing grief related to the deaths of two of her children ( 2013, 2017) . Past history of Bipolar Disorder diagnosis, and endorses episodes of mood instability,  irritability, anger.  Partial improvement compared to admission, but remains depressed with a constricted/dysphoric affect, although affect has become more reactive, eye contact has improved , and speech has improved . She denies SI at this time. No psychotic symptoms are noted or endorsed . She is mainly focused on her adult children. Denies medication side effects at this time. On Abilify/Effexor . Has tolerated Abilify titration well .   Treatment Plan/Recommendations:  Treatment Plan reviewed as below today  6/15 Encourage group and milieu participation Encourage efforts to work on sobriety , relapse prevention Treatment team working on disposition planning options Continue Abilify  15 mgrs QDAY for mood disorder Continue  Effexor XR  75 mgrs QDAY for depression, anxiety Continue  Ativan 0.5 mg every 8 hours as needed for anxiety Continue Trazodone 50 mgrs QHS PRN for insomnia  Jenne Campus, MD 01/03/2020, 1:45 PM   Patient ID: Sheri Hall, female   DOB: 1970-12-14, 49 y.o.   MRN: [id number]

## 2020-01-04 MED ORDER — PANTOPRAZOLE SODIUM 40 MG PO TBEC: 40.0000 mg | DELAYED_RELEASE_TABLET | Freq: Every day | ORAL | 0 refills | Status: DC

## 2020-01-04 MED ORDER — TRAZODONE HCL 50 MG PO TABS: 50.0000 mg | ORAL_TABLET | Freq: Every evening | ORAL | 0 refills | Status: DC | PRN

## 2020-01-04 MED ORDER — VENLAFAXINE HCL ER 75 MG PO CP24: 75.0000 mg | ORAL_CAPSULE | Freq: Every day | ORAL | 0 refills | Status: DC

## 2020-01-04 MED ORDER — ARIPIPRAZOLE 15 MG PO TABS: 15.0000 mg | ORAL_TABLET | Freq: Every day | ORAL | 0 refills | Status: DC

## 2020-01-04 NOTE — Discharge Instructions (Signed)
You are scheduled for an appointment for therapy on 01/17/20 at 1:00 pm.  This appointment will be held in person.  Please arrive at 12:30 pm for paperwork.  You also have an appointment for medication management on 02/02/20 at 11:00 am, in person.

## 2020-01-04 NOTE — Discharge Summary (Signed)
Physician Discharge Summary Note  Patient:  Sheri Hall is an 49 y.o., female  MRN:  [id number]  DOB:  11/06/1970  Patient phone:  [phone] (home)   Patient address:   [address] [place] [address],   Total Time spent with patient: Greater than 30 minutes  Date of Admission:  12/28/2019  Date of Discharge: 01/04/20  Reason for Admission: Worsening symptoms of depression & suicidal ideations.   Principal Problem: Bipolar I disorder, most recent episode mixed [facility])  Discharge Diagnoses: Patient Active Problem List   Diagnosis Date Noted  . Bipolar I disorder, most recent episode mixed ([place]) [F31.60] 12/29/2019    Priority: High  . PTSD (post-traumatic stress disorder) [F43.10] 09/17/2017    Priority: Medium  . Major depressive disorder, recurrent episode ([place]) [F33.9] 12/28/2019  . Lobar pneumonia ([place]) [J18.1] 07/17/2018  . Normocytic anemia [D64.9] 07/17/2018  . Cocaine use [F14.90] 07/17/2018  . Rheumatoid arthritis ([place]) [M06.9] 07/17/2018  . Sepsis ([place]) [A41.9] 07/16/2018  . Substance use disorder [F19.90] 09/17/2017  . Grief [F43.21] 09/17/2017  . MDD (major depressive disorder), severe ([place]) [F32.2] 09/16/2017   Past Psychiatric History: Long history of MDD & SUD. She has had inpatient care in the past. She has been in rehab on multiple occassions. She has been tried on various medications over the years. She has not followed up since then.  No past history of sustained mania. No past history of suicidal behavior.  Past Medical History:  Past Medical History:  Diagnosis Date  . Anxiety   . Arthritis    rheumatoid   . Asthma    seasonal  . Bipolar disorder ([place])   . Depression   . GERD (gastroesophageal reflux disease)   . OCD (obsessive compulsive disorder)   . PTSD (post-traumatic stress disorder)     Past Surgical History:  Procedure Laterality Date  . CESAREAN SECTION    . OPEN REDUCTION INTERNAL FIXATION (ORIF) DISTAL RADIAL FRACTURE Left  10/15/2015   Procedure: OPEN TREATMENT OF LEFT DISTAL RADIUS FRACTURE;  Surgeon: Milly Jakob, MD;  Location: [facility];  Service: Orthopedics;  Laterality: Left;   Family History:  Family History  Family history unknown: Yes   Family Psychiatric  History: Strong family hx. of mood disorder and SUD  Social History:  Social History   Substance and Sexual Activity  Alcohol Use Yes   Comment: occ; liquor     Social History   Substance and Sexual Activity  Drug Use Yes  . Types: Cocaine, "Crack" cocaine   Comment: wednesday March 22nd 2017    Social History   Socioeconomic History  . Marital status: Single    Spouse name: Not on file  . Number of children: Not on file  . Years of education: Not on file  . Highest education level: Not on file  Occupational History  . Not on file  Tobacco Use  . Smoking status: Current Every Day Smoker    Packs/day: 0.50    Types: Cigarettes  . Smokeless tobacco: Never Used  Vaping Use  . Vaping Use: Never used  Substance and Sexual Activity  . Alcohol use: Yes    Comment: occ; liquor  . Drug use: Yes    Types: Cocaine, "Crack" cocaine    Comment: wednesday March 22nd 2017  . Sexual activity: Yes    Birth control/protection: I.U.D.    Comment: Mirena  Other Topics Concern  . Not on file  Social History Narrative  . Not on  file   Social Determinants of Health   Financial Resource Strain:   . Difficulty of Paying Living Expenses:   Food Insecurity:   . Worried About Programme researcher, broadcasting/film/video in the Last Year:   . Barista in the Last Year:   Transportation Needs:   . Freight forwarder (Medical):   Marland Kitchen Lack of Transportation (Non-Medical):   Physical Activity:   . Days of Exercise per Week:   . Minutes of Exercise per Session:   Stress:   . Feeling of Stress :   Social Connections:   . Frequency of Communication with Friends and Family:   . Frequency of Social Gatherings with Friends and Family:   .  Attends Religious Services:   . Active Member of Clubs or Organizations:   . Attends Banker Meetings:   Marland Kitchen Marital Status:    Hospital Course:  (Per Md's admission SRA notes): This is the second psychiatric admission assessment  in this [facility] for this 49 year old caucasian female with prior hx of Bipolar disorder & substance abuse/dependence issues. She was in this hospital for similar presentation 2 years ago. After mood stabilization treatments, she was discharged on medications with recommendation & appointment to an outpatient psychiatric clinic for medication management, routine psychiatric services & substance abuse treatments. She is being re-admitted to the [facility] this time around with complaints of worsening symptoms of depression & suicidal ideations without plans or intent to harm herself. During this admission assessment, [name] is lying down in bed, alert & oriented. However, she presents angry & irritated, a bit uncooperative during this evaluation. At times, will use a question to answer a question. She reports, "I asked my aunt to drop me off at the emergency room to get some treatment. I'm having difficulty with life in general. My emotions & mental health are not right. These has been going on for months. Since, I got to this place, it has been one question after the other. I just woke-up. I don't have my thoughts together yet. Yes, I used some crack, smoked it. I did it to feel better. I have been sober from substances for a while, I relapsed because of my life struggles. I have not been on my mental health medications in a long time. I have been struggling with PTSD & mental health issues since I was a child. That is all about it".  After the above admission evaluation, Violanda's presenting symptoms were noted. She was recommended for mood stabilization treatments. The medication regimen targeting those presenting symptoms were discussed with her & initiated with her consent. She  was medicated, stabilized & discharged on the medications as listed on her discharge medication lists below. Besides the mood stabilization treatments, She was also enrolled & participated in the group counseling sessions being offered & held on this unit. She learned coping skills. She also presented other significant pre-existing medical issues that required treatment. She was resumed & discharged on all her pertinent home medications for those health issues. She tolerated her treatment regimen without any adverse effects or reactions reported.  Lorrene's symptoms responded well to her treatment regimen. This is evidenced by her reports of improved mood & absence of suicidal ideations. She is currently mentally & medically stable to be discharged to continue mental health care on an outpatient basis as noted below. During the course of her hospitalization, the 15-minute checks were adequate to ensure Ryleeann's safety.  Patient although from time to time  will display some outburst, she did not however, display any dangerous, violent or suicidal behaviors on the unit.  She interacted with patients & staff appropriately, participated appropriately in the group sessions/therapies. Her medications were addressed & adjusted to meet her needs. She was recommended for outpatient follow-up care & medication management upon discharge to assure continuity of care.  At the time of discharge, patient is not reporting any acute suicidal/homicidal ideations. She feels more confident about her self-care & in managing her mental health issues. She currently denies any new issues or concerns. Education and supportive counseling provided throughout her hospital stay & upon discharge.  Today upon her discharge evaluation with the attending psychiatrist, Azure shares she is doing well. She denies any other specific concerns. She is sleeping well. Her appetite is good. She denies other physical complaints. She denies AH/VH. She  feels that her medications have been helpful & is in agreement to continue her current treatment regimen. She was able to engage in safety planning including plan to return to [facility] or contact emergency services if she feels unable to maintain her own safety or the safety of others. Pt had no further questions, comments, or concerns. She left [facility] with all personal belongings in no apparent distress. Transportation per family (daughter).  Physical Findings: AIMS: Facial and Oral Movements Muscles of Facial Expression: None, normal Lips and Perioral Area: None, normal Jaw: None, normal Tongue: None, normal,Extremity Movements Upper (arms, wrists, hands, fingers): None, normal Lower (legs, knees, ankles, toes): None, normal, Trunk Movements Neck, shoulders, hips: None, normal, Overall Severity Severity of abnormal movements (highest score from questions above): None, normal Incapacitation due to abnormal movements: None, normal Patient's awareness of abnormal movements (rate only patient's report): No Awareness, Dental Status Current problems with teeth and/or dentures?: No Does patient usually wear dentures?: No  CIWA:    COWS:     Musculoskeletal: Strength & Muscle Tone: within normal limits Gait & Station: normal Patient leans: N/A  Psychiatric Specialty Exam: Physical Exam  Nursing note and vitals reviewed. Constitutional: She is oriented to person, place, and time. She appears well-developed.  Eyes: Pupils are equal, round, and reactive to light.  Cardiovascular: Normal rate.  Respiratory: Effort normal. No respiratory distress. She has no wheezes.  Genitourinary:    Genitourinary Comments: Deferred   Musculoskeletal:        General: Normal range of motion.     Cervical back: Normal range of motion.  Neurological: She is alert and oriented to person, place, and time.  Skin: Skin is warm and dry.    Review of Systems  Constitutional: Negative.  Negative for chills and  diaphoresis.  HENT: Negative.   Eyes: Negative.   Respiratory: Negative for cough, shortness of breath and wheezing.   Cardiovascular: Negative for chest pain and palpitations.  Gastrointestinal: Negative.  Negative for diarrhea, heartburn, nausea and vomiting.  Genitourinary: Negative.   Musculoskeletal: Negative.   Skin: Negative.   Neurological: Negative.  Negative for dizziness, tremors, seizures, weakness and headaches.  Endo/Heme/Allergies: Negative.        Allergies: NKDA  Psychiatric/Behavioral: Positive for depression (Stabilized with medication prior to discharge) and substance abuse (Hx. Cocaine use disorder (Stable)). Negative for hallucinations, memory loss and suicidal ideas. The patient has insomnia (Stabilized with medication prior to discharge). The patient is not nervous/anxious (Stable).     Blood pressure 99/68, pulse (!) 118, temperature 98.1 F (36.7 C), temperature source Oral, resp. rate 16, height 5\' 2"  (1.575 m), weight 57.6  kg, SpO2 99 %.Body mass index is 23.23 kg/m.  See Md's discharge SRA  Sleep:  Number of Hours: 5.75   Has this patient used any form of tobacco in the last 30 days? (Cigarettes, Smokeless Tobacco, Cigars, and/or Pipes): N/A  Blood Alcohol level:  Lab Results  Component Value Date   ETH <10 12/27/2019   ETH <10 09/15/2017   Metabolic Disorder Labs:  Lab Results  Component Value Date   HGBA1C 6.4 (H) 12/31/2019   MPG 136.98 12/31/2019   No results found for: PROLACTIN Lab Results  Component Value Date   CHOL 160 12/31/2019   TRIG 122 12/31/2019   HDL 48 12/31/2019   CHOLHDL 3.3 12/31/2019   VLDL 24 12/31/2019   LDLCALC 88 12/31/2019   See Psychiatric Specialty Exam and Suicide Risk Assessment completed by Attending Physician prior to discharge.  Discharge destination:  Home Is patient on multiple antipsychotic therapies at discharge:  No   Has Patient had three or more failed trials of antipsychotic monotherapy by history:   No  Recommended Plan for Multiple Antipsychotic Therapies: NA  Allergies as of 01/04/2020   No Known Allergies     Medication List    STOP taking these medications   acetaminophen 325 MG tablet Commonly known as: TYLENOL   guaiFENesin 600 MG 12 hr tablet Commonly known as: MUCINEX     TAKE these medications     Indication  ARIPiprazole 15 MG tablet Commonly known as: ABILIFY Take 1 tablet (15 mg total) by mouth daily. For mood control Start taking on: January 05, 2020  Indication: Mood control   pantoprazole 40 MG tablet Commonly known as: PROTONIX Take 1 tablet (40 mg total) by mouth daily. For acid reflux Start taking on: January 05, 2020  Indication: Gastroesophageal Reflux Disease   traZODone 50 MG tablet Commonly known as: DESYREL Take 1 tablet (50 mg total) by mouth at bedtime as needed for sleep.  Indication: Trouble Sleeping   venlafaxine XR 75 MG 24 hr capsule Commonly known as: EFFEXOR-XR Take 1 capsule (75 mg total) by mouth daily with breakfast. For depression Start taking on: January 05, 2020  Indication: Major Depressive Disorder       Follow-up Information    [facility] Follow up.   Specialty: Professional Counselor Why: If interested in outaptient medication management and therapy services, please go to agency during their walk-in hours. Walk-in hours are Monday-Friday from 8:00a-12:00p and 1:00p-3:00p. Be sure to provide any discahrge paperwork.  Contact information: [employer] [facility] [employer] [address] [place] [address] [phone]        [facility] Follow up.   Specialty: Urgent Care Why: In the event of a crisis, please go to facility. Facility is open 24 hours, daily. Facility also offers outpatient medication management, therapy and other psychiatric services. Contact information: [address] [place] [address] [phone]              Follow-up recommendations:  Activity:  As tolerated Diet: As recommended by your primary care doctor. Keep all scheduled follow-up appointments as recommended.  Comments: Prescriptions given at discharge.  Patient agreeable to plan.  Given opportunity to ask questions.  Appears to feel comfortable with discharge denies any current suicidal or homicidal thought. Patient is also instructed prior to discharge to: Take all medications as prescribed by his/her mental healthcare provider. Report any adverse effects and or reactions from the medicines to his/her  outpatient provider promptly. Patient has been instructed & cautioned: To not engage in alcohol and or illegal drug use while on prescription medicines. In the event of worsening symptoms, patient is instructed to call the crisis hotline, 911 and or go to the nearest ED for appropriate evaluation and treatment of symptoms. To follow-up with his/her primary care provider for your other medical issues, concerns and or health care needs.    Signed: Armandina Stammer, NP, PMHNP, FNP-BC 01/04/2020, 9:44 AM

## 2020-01-04 NOTE — Tx Team (Signed)
Interdisciplinary Treatment and Diagnostic Plan Update  01/04/2020 Time of Session:  Sheri Hall MRN: [id number]  Principal Diagnosis: Bipolar I disorder, most recent episode mixed (HCC)  Secondary Diagnoses: Principal Problem:   Bipolar I disorder, most recent episode mixed (HCC) Active Problems:   PTSD (post-traumatic stress disorder)   Major depressive disorder, recurrent episode (HCC)   Current Medications:  Current Facility-Administered Medications  Medication Dose Route Frequency Provider Last Rate Last Admin  . acetaminophen (TYLENOL) tablet 650 mg  650 mg Oral Q6H PRN Armandina Stammer I, NP   650 mg at 12/28/19 1847  . ARIPiprazole (ABILIFY) tablet 15 mg  15 mg Oral Daily Cobos, Rockey Situ, MD   15 mg at 01/04/20 0817  . feeding supplement (ENSURE ENLIVE) (ENSURE ENLIVE) liquid 237 mL  237 mL Oral BID BM Cobos, Rockey Situ, MD   237 mL at 01/03/20 1100  . ibuprofen (ADVIL) tablet 400 mg  400 mg Oral Q6H PRN Cobos, Rockey Situ, MD   400 mg at 01/03/20 0741  . LORazepam (ATIVAN) tablet 0.5 mg  0.5 mg Oral Q8H PRN Cobos, Rockey Situ, MD   0.5 mg at 01/04/20 0819  . multivitamin with minerals tablet 1 tablet  1 tablet Oral Daily Cobos, Rockey Situ, MD   1 tablet at 01/04/20 0817  . pantoprazole (PROTONIX) EC tablet 40 mg  40 mg Oral Daily Cobos, Rockey Situ, MD   40 mg at 01/04/20 0817  . traZODone (DESYREL) tablet 50 mg  50 mg Oral QHS PRN Cobos, Rockey Situ, MD   50 mg at 01/03/20 2338  . venlafaxine XR (EFFEXOR-XR) 24 hr capsule 75 mg  75 mg Oral Q breakfast Cobos, Rockey Situ, MD   75 mg at 01/04/20 0817  . ziprasidone (GEODON) injection 20 mg  20 mg Intramuscular Once Armandina Stammer I, NP       PTA Medications: Medications Prior to Admission  Medication Sig Dispense Refill Last Dose  . acetaminophen (TYLENOL) 325 MG tablet Take 2 tablets (650 mg total) by mouth every 6 (six) hours as needed for mild pain (or Fever >/= 101). (Patient not taking: Reported on 12/27/2019)     . guaiFENesin  (MUCINEX) 600 MG 12 hr tablet Take 1 tablet (600 mg total) by mouth 2 (two) times daily. (Patient not taking: Reported on 12/27/2019) 20 tablet 0     Patient Stressors: Financial difficulties Loss of two children  Patient Strengths: Capable of independent living Wellsite geologist fund of knowledge Motivation for treatment/growth Physical Health Supportive family/friends  Treatment Modalities: Medication Management, Group therapy, Case management,  1 to 1 session with clinician, Psychoeducation, Recreational therapy.   Physician Treatment Plan for Primary Diagnosis: Bipolar I disorder, most recent episode mixed (HCC) Long Term Goal(s): Improvement in symptoms so as ready for discharge Improvement in symptoms so as ready for discharge   Short Term Goals: Ability to identify changes in lifestyle to reduce recurrence of condition will improve Ability to verbalize feelings will improve Ability to disclose and discuss suicidal ideas Ability to identify and develop effective coping behaviors will improve Compliance with prescribed medications will improve Ability to identify triggers associated with substance abuse/mental health issues will improve  Medication Management: Evaluate patient's response, side effects, and tolerance of medication regimen.  Therapeutic Interventions: 1 to 1 sessions, Unit Group sessions and Medication administration.  Evaluation of Outcomes: Adequate for Discharge  Physician Treatment Plan for Secondary Diagnosis: Principal Problem:   Bipolar I disorder, most recent episode mixed (HCC) Active Problems:  PTSD (post-traumatic stress disorder)   Major depressive disorder, recurrent episode ([place])  Long Term Goal(s): Improvement in symptoms so as ready for discharge Improvement in symptoms so as ready for discharge   Short Term Goals: Ability to identify changes in lifestyle to reduce recurrence of condition will improve Ability to verbalize  feelings will improve Ability to disclose and discuss suicidal ideas Ability to identify and develop effective coping behaviors will improve Compliance with prescribed medications will improve Ability to identify triggers associated with substance abuse/mental health issues will improve     Medication Management: Evaluate patient's response, side effects, and tolerance of medication regimen.  Therapeutic Interventions: 1 to 1 sessions, Unit Group sessions and Medication administration.  Evaluation of Outcomes: Adequate for Discharge   RN Treatment Plan for Primary Diagnosis: Bipolar I disorder, most recent episode mixed ([place]) Long Term Goal(s): Knowledge of disease and therapeutic regimen to maintain health will improve  Short Term Goals: Ability to participate in decision making will improve and Ability to verbalize feelings will improve  Medication Management: RN will administer medications as ordered by provider, will assess and evaluate patient's response and provide education to patient for prescribed medication. RN will report any adverse and/or side effects to prescribing provider.  Therapeutic Interventions: 1 on 1 counseling sessions, Psychoeducation, Medication administration, Evaluate responses to treatment, Monitor vital signs and CBGs as ordered, Perform/monitor CIWA, COWS, AIMS and Fall Risk screenings as ordered, Perform wound care treatments as ordered.  Evaluation of Outcomes: Adequate for Discharge   LCSW Treatment Plan for Primary Diagnosis: Bipolar I disorder, most recent episode mixed ([place]) Long Term Goal(s): Safe transition to appropriate next level of care at discharge, Engage patient in therapeutic group addressing interpersonal concerns.  Short Term Goals: Engage patient in aftercare planning with referrals and resources and Increase social support  Therapeutic Interventions: Assess for all discharge needs, 1 to 1 time with Social worker, Explore available  resources and support systems, Assess for adequacy in community support network, Educate family and significant other(s) on suicide prevention, Complete Psychosocial Assessment, Interpersonal group therapy.  Evaluation of Outcomes: Adequate for Discharge   Progress in Treatment: Attending groups: Yes. Participating in groups: Yes. Taking medication as prescribed: Yes. Toleration medication: Yes. Family/Significant other contact made: No, will contact:  declined consents, SPE reviewed with patient. Patient understands diagnosis: Yes. Discussing patient identified problems/goals with staff: Yes. Medical problems stabilized or resolved: Yes. Denies suicidal/homicidal ideation: Yes. Issues/concerns per patient self-inventory: No. Other:   New problem(s) identified: None   New Short Term/Long Term Goal(s): Detox, medication stabilization, elimination of SI thoughts, development of comprehensive mental wellness plan.    Patient Goals:    Discharge Plan or Barriers: Following up with [facility] for medication management and therapy   Reason for Continuation of Hospitalization: Anxiety Depression    Estimated Length of Stay: discharge today  Attendees: Patient: 01/04/2020 8:58 AM  Physician:  01/04/2020 8:58 AM  Nursing:  01/04/2020 8:58 AM  RN Care Manager: 01/04/2020 8:58 AM  Social Worker: Radonna Ricker, LCSW 01/04/2020 8:58 AM  Recreational Therapist:  01/04/2020 8:58 AM  Other:  01/04/2020 8:58 AM  Other:  01/04/2020 8:58 AM  Other: 01/04/2020 8:58 AM    Scribe for Treatment Team: Joellen Jersey, [place] 01/04/2020 8:58 AM

## 2020-01-04 NOTE — Progress Notes (Signed)
   01/04/20 1000  Psych Admission Type (Psych Patients Only)  Admission Status Voluntary  Psychosocial Assessment  Patient Complaints Anxiety  Eye Contact Brief  Facial Expression Flat  Affect Anxious  Speech Logical/coherent;Soft  Interaction Minimal;Guarded;Superficial  Motor Activity Other (Comment) (WNL)  Appearance/Hygiene Unremarkable  Behavior Characteristics Cooperative  Mood Anxious  Aggressive Behavior  Effect No apparent injury  Thought Process  Coherency WDL  Content WDL  Delusions None reported or observed  Perception WDL  Hallucination None reported or observed  Judgment Poor  Confusion None  Danger to Self  Current suicidal ideation? Denies  Danger to Others  Danger to Others None reported or observed

## 2020-01-04 NOTE — Progress Notes (Signed)
Pt discharged to lobby- brother present to pick up patient. Pt was stable and appreciative at that time. All papers and prescriptions were given and valuables returned. Verbal understanding expressed. Denies SI/HI and A/VH. Pt given opportunity to express concerns and ask questions.

## 2020-01-04 NOTE — Progress Notes (Signed)
  [facility] Adult Case Management Discharge Plan :  Will you be returning to the same living situation after discharge:  Yes,  patient reports she is returning home At discharge, do you have transportation home?: Yes,  patient reports her adult daughter is picking her up Do you have the ability to pay for your medications: Yes,  Medicaid  Release of information consent forms completed and in the chart;  Patient's signature needed at discharge.  Patient to Follow up at:  Follow-up Information    [facility] Follow up.   Specialty: Professional Counselor Why: If interested in outaptient medication management and therapy services, please go to agency during their walk-in hours. Walk-in hours are Monday-Friday from 8:00a-12:00p and 1:00p-3:00p. Be sure to provide any discahrge paperwork.  Contact information: [employer] [facility] [employer] [address] [place] [address] [phone]        [facility] Follow up.   Specialty: Urgent Care Why: In the event of a crisis, please go to facility. Facility is open 24 hours, daily. Facility also offers outpatient medication management, therapy and other psychiatric services. Contact information: [address] [place] [address] [phone]              Next level of care provider has access to [facility]:yes  Safety Planning and Suicide Prevention discussed: Yes,  with the patient     Has patient been referred to the Quitline?: Patient refused referral  Patient has been referred for addiction treatment: Pt. refused referral  Maeola Sarah, LCSWA 01/04/2020, 9:34 AM

## 2020-01-04 NOTE — Progress Notes (Signed)
Pt has been isolative, guarded and not very interactive. Pt stated she was able to stay up today more. Denies Si/Hi and contracted for safety, will continue to monitor.

## 2020-01-04 NOTE — BHH Suicide Risk Assessment (Signed)
[facility] Discharge Suicide Risk Assessment   Principal Problem: Bipolar I disorder, most recent episode mixed [facility]) Discharge Diagnoses: Principal Problem:   Bipolar I disorder, most recent episode mixed (HCC) Active Problems:   PTSD (post-traumatic stress disorder)   Major depressive disorder, recurrent episode (HCC)   Total Time spent with patient: 15 minutes  Musculoskeletal: Strength & Muscle Tone: within normal limits Gait & Station: normal Patient leans: N/A  Psychiatric Specialty Exam: Review of Systems  All other systems reviewed and are negative.   Blood pressure 99/68, pulse (!) 118, temperature 98.1 F (36.7 C), temperature source Oral, resp. rate 16, height 5\' 2"  (1.575 m), weight 57.6 kg, SpO2 99 %.Body mass index is 23.23 kg/m.  General Appearance: Casual  Eye Contact::  Fair  Speech:  Normal Rate409  Volume:  Normal  Mood:  Irritable  Affect:  Congruent  Thought Process:  Coherent and Descriptions of Associations: Intact  Orientation:  Full (Time, Place, and Person)  Thought Content:  Logical  Suicidal Thoughts:  No  Homicidal Thoughts:  No  Memory:  Immediate;   Fair Recent;   Fair Remote;   Fair  Judgement:  Intact  Insight:  Fair  Psychomotor Activity:  Normal  Concentration:  Fair  Recall:  [phone] of Knowledge:Good  Language: Good  Akathisia:  Negative  Handed:  Right  AIMS (if indicated):     Assets:  Desire for Improvement Resilience  Sleep:  Number of Hours: 5.75  Cognition: WNL  ADL's:  Intact   Mental Status Per Nursing Assessment::   On Admission:  Suicidal ideation indicated by patient, Self-harm thoughts  Demographic Factors:  Low socioeconomic status and Living alone  Loss Factors: Financial problems/change in socioeconomic status  Historical Factors: Impulsivity  Risk Reduction Factors:   NA  Continued Clinical Symptoms:  Bipolar Disorder:   Mixed State Alcohol/Substance Abuse/Dependencies  Cognitive Features That  Contribute To Risk:  None    Suicide Risk:  Minimal: No identifiable suicidal ideation.  Patients presenting with no risk factors but with morbid ruminations; may be classified as minimal risk based on the severity of the depressive symptoms   Follow-up Information    [facility]. Go on 01/17/2020.   Specialty: Behavioral Health Why: You are scheduled for an appointment for therapy on 01/17/20 at 1:00 pm.  This appointment will be held in person.  Please arrive at 12:30 pm for paperwork.  You also have an appointment for medication management on 02/02/20 at 11:00 am, in person. Contact information: [address] [place] [phone]              Plan Of Care/Follow-up recommendations:  Activity:  ad lib  [phone], MD 01/04/2020, 9:14 AM

## 2020-01-17 ENCOUNTER — Ambulatory Visit (HOSPITAL_COMMUNITY): Payer: Medicaid Other | Admitting: Licensed Clinical Social Worker

## 2020-02-02 ENCOUNTER — Ambulatory Visit (HOSPITAL_COMMUNITY): Payer: Medicaid Other | Admitting: Psychiatry

## 2020-03-04 ENCOUNTER — Emergency Department (HOSPITAL_COMMUNITY)
Admission: EM | Admit: 2020-03-04 | Discharge: 2020-03-05 | Disposition: A | Discharge status: Home / Self Care | Payer: Medicaid Other | Attending: Emergency Medicine | Admitting: Emergency Medicine

## 2020-03-04 ENCOUNTER — Encounter (HOSPITAL_COMMUNITY): Payer: Self-pay | Admitting: Emergency Medicine

## 2020-03-04 ENCOUNTER — Other Ambulatory Visit: Payer: Self-pay

## 2020-03-04 DIAGNOSIS — Y32XXXA Crashing of motor vehicle, undetermined intent, initial encounter: Secondary | ICD-10-CM | POA: Insufficient documentation

## 2020-03-04 DIAGNOSIS — Y939 Activity, unspecified: Secondary | ICD-10-CM | POA: Insufficient documentation

## 2020-03-04 DIAGNOSIS — Y999 Unspecified external cause status: Secondary | ICD-10-CM | POA: Diagnosis not present

## 2020-03-04 DIAGNOSIS — Z5321 Procedure and treatment not carried out due to patient leaving prior to being seen by health care provider: Secondary | ICD-10-CM | POA: Insufficient documentation

## 2020-03-04 DIAGNOSIS — R519 Headache, unspecified: Secondary | ICD-10-CM | POA: Diagnosis not present

## 2020-03-04 DIAGNOSIS — Y929 Unspecified place or not applicable: Secondary | ICD-10-CM | POA: Insufficient documentation

## 2020-03-04 NOTE — ED Notes (Signed)
Pt told registration they did not want to wait and left
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
## 2020-03-05 ENCOUNTER — Ambulatory Visit (HOSPITAL_COMMUNITY)
Admission: EM | Admit: 2020-03-05 | Discharge: 2020-03-05 | Disposition: A | Discharge status: Home / Self Care | Payer: Medicaid Other | Attending: Family Medicine | Admitting: Family Medicine

## 2020-03-05 ENCOUNTER — Other Ambulatory Visit: Payer: Self-pay

## 2020-03-05 ENCOUNTER — Encounter (HOSPITAL_COMMUNITY): Payer: Self-pay | Admitting: Emergency Medicine

## 2020-03-05 DIAGNOSIS — R519 Headache, unspecified: Secondary | ICD-10-CM

## 2020-03-05 DIAGNOSIS — M545 Low back pain, unspecified: Secondary | ICD-10-CM

## 2020-03-05 MED ORDER — IBUPROFEN 600 MG PO TABS
600.0000 mg | ORAL_TABLET | Freq: Four times a day (QID) | ORAL | 0 refills | Status: DC | PRN
Start: 2020-03-05 — End: 2022-09-02

## 2020-03-05 MED ORDER — CYCLOBENZAPRINE HCL 10 MG PO TABS
10.0000 mg | ORAL_TABLET | Freq: Two times a day (BID) | ORAL | 0 refills | Status: DC | PRN
Start: 2020-03-05 — End: 2021-04-10

## 2020-03-05 MED ORDER — KETOROLAC TROMETHAMINE 60 MG/2ML IM SOLN
60.0000 mg | Freq: Once | INTRAMUSCULAR | Status: AC
  Administered 2020-03-05: 60 mg via INTRAMUSCULAR

## 2020-03-05 MED ORDER — KETOROLAC TROMETHAMINE 60 MG/2ML IM SOLN
INTRAMUSCULAR | Status: AC
  Filled 2020-03-05: qty 2
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
## 2020-03-05 NOTE — Discharge Instructions (Signed)
Take ibuprofen as needed for your pain.    Take the muscle relaxer Flexeril as needed for muscle spasm; Do not drive, operate machinery, or drink alcohol with this medication as it may make you drowsy.    Follow up with your primary care provider or an orthopedist if your pain is not improving.     
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
## 2020-03-19 ENCOUNTER — Emergency Department (HOSPITAL_COMMUNITY): Payer: Medicaid Other

## 2020-03-19 ENCOUNTER — Encounter (HOSPITAL_COMMUNITY): Payer: Self-pay | Admitting: Emergency Medicine

## 2020-03-19 ENCOUNTER — Inpatient Hospital Stay (HOSPITAL_COMMUNITY)
Admission: EM | Admit: 2020-03-19 | Discharge: 2020-03-26 | DRG: 177 | Disposition: A | Discharge status: Home Health | Payer: Medicaid Other | Attending: Internal Medicine | Admitting: Internal Medicine

## 2020-03-19 DIAGNOSIS — E860 Dehydration: Secondary | ICD-10-CM | POA: Diagnosis not present

## 2020-03-19 DIAGNOSIS — F429 Obsessive-compulsive disorder, unspecified: Secondary | ICD-10-CM | POA: Diagnosis present

## 2020-03-19 DIAGNOSIS — Z79899 Other long term (current) drug therapy: Secondary | ICD-10-CM

## 2020-03-19 DIAGNOSIS — F322 Major depressive disorder, single episode, severe without psychotic features: Secondary | ICD-10-CM | POA: Diagnosis not present

## 2020-03-19 DIAGNOSIS — U071 COVID-19: Secondary | ICD-10-CM | POA: Diagnosis not present

## 2020-03-19 DIAGNOSIS — R112 Nausea with vomiting, unspecified: Secondary | ICD-10-CM | POA: Diagnosis present

## 2020-03-19 DIAGNOSIS — F4310 Post-traumatic stress disorder, unspecified: Secondary | ICD-10-CM | POA: Diagnosis present

## 2020-03-19 DIAGNOSIS — M069 Rheumatoid arthritis, unspecified: Secondary | ICD-10-CM | POA: Diagnosis present

## 2020-03-19 DIAGNOSIS — E871 Hypo-osmolality and hyponatremia: Secondary | ICD-10-CM | POA: Diagnosis present

## 2020-03-19 DIAGNOSIS — E8809 Other disorders of plasma-protein metabolism, not elsewhere classified: Secondary | ICD-10-CM | POA: Diagnosis present

## 2020-03-19 DIAGNOSIS — J45909 Unspecified asthma, uncomplicated: Secondary | ICD-10-CM | POA: Diagnosis present

## 2020-03-19 DIAGNOSIS — E861 Hypovolemia: Secondary | ICD-10-CM | POA: Diagnosis present

## 2020-03-19 DIAGNOSIS — F17210 Nicotine dependence, cigarettes, uncomplicated: Secondary | ICD-10-CM | POA: Diagnosis present

## 2020-03-19 DIAGNOSIS — J9601 Acute respiratory failure with hypoxia: Secondary | ICD-10-CM | POA: Diagnosis present

## 2020-03-19 DIAGNOSIS — J1282 Pneumonia due to coronavirus disease 2019: Secondary | ICD-10-CM | POA: Diagnosis present

## 2020-03-19 DIAGNOSIS — F319 Bipolar disorder, unspecified: Secondary | ICD-10-CM | POA: Diagnosis present

## 2020-03-19 DIAGNOSIS — K219 Gastro-esophageal reflux disease without esophagitis: Secondary | ICD-10-CM | POA: Diagnosis present

## 2020-03-19 LAB — CBC WITH DIFFERENTIAL/PLATELET
Abs Immature Granulocytes: 0.04 10*3/uL (ref 0.00–0.07)
Basophils Absolute: 0 10*3/uL (ref 0.0–0.1)
Basophils Relative: 1 %
Eosinophils Absolute: 0.1 10*3/uL (ref 0.0–0.5)
Eosinophils Relative: 1 %
HCT: 35.1 % — ABNORMAL LOW (ref 36.0–46.0)
Hemoglobin: 11.1 g/dL — ABNORMAL LOW (ref 12.0–15.0)
Immature Granulocytes: 1 %
Lymphocytes Relative: 45 %
Lymphs Abs: 3.6 10*3/uL (ref 0.7–4.0)
MCH: 27.5 pg (ref 26.0–34.0)
MCHC: 31.6 g/dL (ref 30.0–36.0)
MCV: 87.1 fL (ref 80.0–100.0)
Monocytes Absolute: 0.5 10*3/uL (ref 0.1–1.0)
Monocytes Relative: 6 %
Neutro Abs: 3.7 10*3/uL (ref 1.7–7.7)
Neutrophils Relative %: 46 %
Platelets: 267 10*3/uL (ref 150–400)
RBC: 4.03 MIL/uL (ref 3.87–5.11)
RDW: 15.3 % (ref 11.5–15.5)
WBC: 8 10*3/uL (ref 4.0–10.5)
nRBC: 0 % (ref 0.0–0.2)

## 2020-03-19 LAB — COMPREHENSIVE METABOLIC PANEL
ALT: 34 U/L (ref 0–44)
AST: 56 U/L — ABNORMAL HIGH (ref 15–41)
Albumin: 2.2 g/dL — ABNORMAL LOW (ref 3.5–5.0)
Alkaline Phosphatase: 39 U/L (ref 38–126)
Anion gap: 6 (ref 5–15)
BUN: 16 mg/dL (ref 6–20)
CO2: 25 mmol/L (ref 22–32)
Calcium: 7.9 mg/dL — ABNORMAL LOW (ref 8.9–10.3)
Chloride: 98 mmol/L (ref 98–111)
Creatinine, Ser: 0.95 mg/dL (ref 0.44–1.00)
GFR calc Af Amer: >60 mL/min (ref >60)
GFR calc non Af Amer: >60 mL/min (ref >60)
Glucose, Bld: 93 mg/dL (ref 70–99)
Potassium: 4.5 mmol/L (ref 3.5–5.1)
Sodium: 129 mmol/L — ABNORMAL LOW (ref 135–145)
Total Bilirubin: 0.3 mg/dL (ref 0.3–1.2)
Total Protein: 10.3 g/dL — ABNORMAL HIGH (ref 6.5–8.1)

## 2020-03-19 LAB — CBC
HCT: 32.5 % — ABNORMAL LOW (ref 36.0–46.0)
Hemoglobin: 10.3 g/dL — ABNORMAL LOW (ref 12.0–15.0)
MCH: 27.5 pg (ref 26.0–34.0)
MCHC: 31.7 g/dL (ref 30.0–36.0)
MCV: 86.9 fL (ref 80.0–100.0)
Platelets: 260 10*3/uL (ref 150–400)
RBC: 3.74 MIL/uL — ABNORMAL LOW (ref 3.87–5.11)
RDW: 15.3 % (ref 11.5–15.5)
WBC: 7.5 10*3/uL (ref 4.0–10.5)
nRBC: 0 % (ref 0.0–0.2)

## 2020-03-19 LAB — ABO/RH: ABO/RH(D): O POS

## 2020-03-19 LAB — FIBRINOGEN: Fibrinogen: 509 mg/dL — ABNORMAL HIGH (ref 210–475)

## 2020-03-19 LAB — I-STAT BETA HCG BLOOD, ED (MC, WL, AP ONLY): I-stat hCG, quantitative: <5 m[IU]/mL (ref <5)

## 2020-03-19 LAB — CREATININE, SERUM
Creatinine, Ser: 0.8 mg/dL (ref 0.44–1.00)
GFR calc Af Amer: >60 mL/min (ref >60)
GFR calc non Af Amer: >60 mL/min (ref >60)

## 2020-03-19 LAB — HIV ANTIBODY (ROUTINE TESTING W REFLEX): HIV Screen 4th Generation wRfx: NONREACTIVE

## 2020-03-19 LAB — LACTIC ACID, PLASMA: Lactic Acid, Venous: 1.2 mmol/L (ref 0.5–1.9)

## 2020-03-19 LAB — PROCALCITONIN: Procalcitonin: <0.1 ng/mL

## 2020-03-19 LAB — C-REACTIVE PROTEIN: CRP: 3.9 mg/dL — ABNORMAL HIGH (ref <1.0)

## 2020-03-19 LAB — D-DIMER, QUANTITATIVE: D-Dimer, Quant: <0.27 ug/mL-FEU (ref 0.00–0.50)

## 2020-03-19 LAB — LACTATE DEHYDROGENASE: LDH: 229 U/L — ABNORMAL HIGH (ref 98–192)

## 2020-03-19 LAB — TRIGLYCERIDES: Triglycerides: 95 mg/dL (ref <150)

## 2020-03-19 LAB — FERRITIN: Ferritin: 80 ng/mL (ref 11–307)

## 2020-03-19 LAB — SARS CORONAVIRUS 2 BY RT PCR (HOSPITAL ORDER, PERFORMED IN ~~LOC~~ HOSPITAL LAB): SARS Coronavirus 2: POSITIVE — AB

## 2020-03-19 MED ORDER — ONDANSETRON HCL 4 MG PO TABS: 4.0000 mg | ORAL_TABLET | Freq: Four times a day (QID) | ORAL | Status: DC | PRN

## 2020-03-19 MED ORDER — ONDANSETRON HCL 4 MG/2ML IJ SOLN
4.0000 mg | Freq: Four times a day (QID) | INTRAMUSCULAR | Status: DC | PRN
  Administered 2020-03-19: 4 mg via INTRAVENOUS
  Filled 2020-03-19: qty 2

## 2020-03-19 MED ORDER — ZINC SULFATE 220 (50 ZN) MG PO CAPS
220.0000 mg | ORAL_CAPSULE | Freq: Every day | ORAL | Status: DC
  Administered 2020-03-19 – 2020-03-26 (×8): 220 mg via ORAL
  Filled 2020-03-19 (×8): qty 1

## 2020-03-19 MED ORDER — ALBUTEROL SULFATE HFA 108 (90 BASE) MCG/ACT IN AERS
2.0000 | INHALATION_SPRAY | Freq: Four times a day (QID) | RESPIRATORY_TRACT | Status: DC
  Administered 2020-03-19 – 2020-03-26 (×25): 2 via RESPIRATORY_TRACT
  Filled 2020-03-19 (×5): qty 6.7

## 2020-03-19 MED ORDER — ACETAMINOPHEN 325 MG PO TABS
650.0000 mg | ORAL_TABLET | Freq: Four times a day (QID) | ORAL | Status: DC | PRN
  Administered 2020-03-19 – 2020-03-21 (×2): 650 mg via ORAL
  Filled 2020-03-19 (×2): qty 2

## 2020-03-19 MED ORDER — SODIUM CHLORIDE 0.9 % IV BOLUS
500.0000 mL | Freq: Once | INTRAVENOUS | Status: AC
  Administered 2020-03-19: 500 mL via INTRAVENOUS

## 2020-03-19 MED ORDER — HYDROCOD POLST-CPM POLST ER 10-8 MG/5ML PO SUER
5.0000 mL | Freq: Two times a day (BID) | ORAL | Status: DC | PRN
  Administered 2020-03-19 – 2020-03-25 (×7): 5 mL via ORAL
  Filled 2020-03-19 (×7): qty 5

## 2020-03-19 MED ORDER — HYDROXYZINE HCL 25 MG PO TABS
25.0000 mg | ORAL_TABLET | Freq: Every day | ORAL | Status: DC
  Administered 2020-03-20 – 2020-03-25 (×5): 25 mg via ORAL
  Filled 2020-03-19 (×6): qty 1

## 2020-03-19 MED ORDER — ASCORBIC ACID 500 MG PO TABS
500.0000 mg | ORAL_TABLET | Freq: Every day | ORAL | Status: DC
  Administered 2020-03-19 – 2020-03-26 (×8): 500 mg via ORAL
  Filled 2020-03-19 (×7): qty 1

## 2020-03-19 MED ORDER — IPRATROPIUM BROMIDE HFA 17 MCG/ACT IN AERS
2.0000 | INHALATION_SPRAY | Freq: Once | RESPIRATORY_TRACT | Status: AC
  Administered 2020-03-19: 2 via RESPIRATORY_TRACT
  Filled 2020-03-19: qty 12.9

## 2020-03-19 MED ORDER — ACETAMINOPHEN 650 MG RE SUPP: 650.0000 mg | Freq: Four times a day (QID) | RECTAL | Status: DC | PRN

## 2020-03-19 MED ORDER — PROPRANOLOL HCL 20 MG PO TABS: 10.0000 mg | ORAL_TABLET | Freq: Two times a day (BID) | ORAL | Status: DC | PRN

## 2020-03-19 MED ORDER — GUAIFENESIN-DM 100-10 MG/5ML PO SYRP
10.0000 mL | ORAL_SOLUTION | ORAL | Status: DC | PRN
  Administered 2020-03-20 – 2020-03-25 (×10): 10 mL via ORAL
  Filled 2020-03-19 (×10): qty 10

## 2020-03-19 MED ORDER — ENOXAPARIN SODIUM 40 MG/0.4ML ~~LOC~~ SOLN
40.0000 mg | SUBCUTANEOUS | Status: DC
  Administered 2020-03-19 – 2020-03-25 (×6): 40 mg via SUBCUTANEOUS
  Filled 2020-03-19 (×7): qty 0.4

## 2020-03-19 MED ORDER — ALBUTEROL SULFATE HFA 108 (90 BASE) MCG/ACT IN AERS
4.0000 | INHALATION_SPRAY | RESPIRATORY_TRACT | Status: DC
  Administered 2020-03-19 (×2): 4 via RESPIRATORY_TRACT
  Filled 2020-03-19: qty 6.7

## 2020-03-19 MED ORDER — VITAMIN B-6 100 MG PO TABS
100.0000 mg | ORAL_TABLET | Freq: Every day | ORAL | Status: DC
  Administered 2020-03-20 – 2020-03-26 (×7): 100 mg via ORAL
  Filled 2020-03-19 (×8): qty 1

## 2020-03-19 MED ORDER — SODIUM CHLORIDE 0.9 % IV SOLN: INTRAVENOUS | Status: DC

## 2020-03-19 NOTE — H&P (Signed)
History and Physical    Efrata Genther JXB:[id number] DOB: 04/29/1971 DOA: 03/19/2020  PCP: Norm Salt, PA  Patient coming from: Home  Chief Complaint: Weakness, nausea  HPI: Sheri Hall is a 49 y.o. female with medical history significant of cough, nausea, decreased appetite for approximately 1 week prior to this hospital visit.  Patient reports productive cough with shortness of breath.  Also decreased appetite, worse over the past 2 to 3 days prior to this hospital visit.  Patient also reports generalized malaise and increasing weakness.  In addition patient reports decreased appetite.  Patient is not vaccinated against COVID-19  ED Course: In the emergency department, with O2 sats consistently greater than 95%.  X-ray was reviewed which was found to be unremarkable.  Patient was confirmed Covid positive.  Inflammatory markers were mildly elevated, notably CRP just over 3.  Sodium noted to be 129.  Patient appeared clinically dehydrated, thus hospitalist service consulted for consideration for medical admission  Review of Systems:  Review of Systems  Constitutional: Positive for chills and malaise/fatigue.  HENT: Negative for ear discharge and ear pain.   Eyes: Negative for double vision, photophobia and pain.  Respiratory: Positive for cough, shortness of breath and wheezing.   Cardiovascular: Negative for orthopnea, claudication and leg swelling.  Gastrointestinal: Positive for nausea and vomiting. Negative for constipation.  Genitourinary: Negative for frequency, hematuria and urgency.  Musculoskeletal: Positive for myalgias. Negative for back pain and neck pain.  Neurological: Negative for speech change, focal weakness, seizures and loss of consciousness.  Psychiatric/Behavioral: Negative for hallucinations and memory loss.    Past Medical History:  Diagnosis Date  . Anxiety   . Arthritis    rheumatoid   . Asthma    seasonal  . Bipolar disorder (HCC)   .  Depression   . GERD (gastroesophageal reflux disease)   . OCD (obsessive compulsive disorder)   . PTSD (post-traumatic stress disorder)     Past Surgical History:  Procedure Laterality Date  . CESAREAN SECTION    . OPEN REDUCTION INTERNAL FIXATION (ORIF) DISTAL RADIAL FRACTURE Left 10/15/2015   Procedure: OPEN TREATMENT OF LEFT DISTAL RADIUS FRACTURE;  Surgeon: Mack Hook, MD;  Location: [facility];  Service: Orthopedics;  Laterality: Left;     reports that she has been smoking cigarettes. She has been smoking about 0.50 packs per day. She has never used smokeless tobacco. She reports current alcohol use. She reports current drug use. Drugs: Cocaine and "Crack" cocaine.  No Known Allergies  Family History  Problem Relation Age of Onset  . Healthy Mother   . Healthy Father     Prior to Admission medications   Medication Sig Start Date End Date Taking? Authorizing Provider  ARIPiprazole (ABILIFY) 15 MG tablet Take 1 tablet (15 mg total) by mouth daily. For mood control 01/05/20   Armandina Stammer I, NP  cyclobenzaprine (FLEXERIL) 10 MG tablet Take 1 tablet (10 mg total) by mouth 2 (two) times daily as needed for muscle spasms. 03/05/20   Moshe Cipro, NP  hydrOXYzine (VISTARIL) 25 MG capsule Take 25 mg by mouth at bedtime. 03/13/20   [provider]  ibuprofen (ADVIL) 600 MG tablet Take 1 tablet (600 mg total) by mouth every 6 (six) hours as needed. 03/05/20   Moshe Cipro, NP  ondansetron (ZOFRAN) 8 MG tablet Take 8 mg by mouth 3 (three) times daily as needed. 03/13/20   [provider]  pantoprazole (PROTONIX) 40 MG tablet Take 1 tablet (40  mg total) by mouth daily. For acid reflux 01/05/20   Armandina Stammer I, NP  propranolol (INDERAL) 10 MG tablet Take 10 mg by mouth 2 (two) times daily as needed. 03/13/20   [provider]  traZODone (DESYREL) 50 MG tablet Take 1 tablet (50 mg total) by mouth at bedtime as needed for sleep. 01/04/20    Armandina Stammer I, NP  venlafaxine XR (EFFEXOR-XR) 75 MG 24 hr capsule Take 1 capsule (75 mg total) by mouth daily with breakfast. For depression 01/05/20   Armandina Stammer I, NP    Physical Exam: Vitals:   03/19/20 1340 03/19/20 1454 03/19/20 1515 03/19/20 1528  BP: 92/69 98/78 102/82   Pulse:  (!) 105 98   Resp:  15 (!) 22   Temp: 98.3 F (36.8 C)     TempSrc: Oral     SpO2:  99% 96%   Weight:    58.1 kg  Height:    5\' 1"  (1.549 m)    Constitutional: NAD, calm, comfortable Vitals:   03/19/20 1340 03/19/20 1454 03/19/20 1515 03/19/20 1528  BP: 92/69 98/78 102/82   Pulse:  (!) 105 98   Resp:  15 (!) 22   Temp: 98.3 F (36.8 C)     TempSrc: Oral     SpO2:  99% 96%   Weight:    58.1 kg  Height:    5\' 1"  (1.549 m)   Eyes: PERRL, lids and conjunctivae normal ENMT: External ears appear normal, head atraumatic Neck: normal, supple, no masses, trachea midline Respiratory: No audible wheezing, normal respiratory effort Cardiovascular: Perfused, no notable JVD Abdomen: Nondistended, nontender Musculoskeletal: no clubbing / cyanosis. No joint deformity upper and lower extremities. Skin: no rashes, poor skin turgor Neurologic: CN 2-12 grossly intact. Sensation intact, no tremors Psychiatric: Normal judgment and insight. Alert and oriented x 3. Normal mood.    Labs on Admission: I have personally reviewed following labs and imaging studies  CBC: Recent Labs  Lab 03/19/20 1201  WBC 8.0  NEUTROABS 3.7  HGB 11.1*  HCT 35.1*  MCV 87.1  PLT 267   Basic Metabolic Panel: Recent Labs  Lab 03/19/20 1201  NA 129*  K 4.5  CL 98  CO2 25  GLUCOSE 93  BUN 16  CREATININE 0.95  CALCIUM 7.9*   GFR: Estimated Creatinine Clearance: 58.7 mL/min (by C-G formula based on SCr of 0.95 mg/dL). Liver Function Tests: Recent Labs  Lab 03/19/20 1201  AST 56*  ALT 34  ALKPHOS 39  BILITOT 0.3  PROT 10.3*  ALBUMIN 2.2*   No results for input(s): LIPASE, AMYLASE in the last 168  hours. No results for input(s): AMMONIA in the last 168 hours. Coagulation Profile: No results for input(s): INR, PROTIME in the last 168 hours. Cardiac Enzymes: No results for input(s): CKTOTAL, CKMB, CKMBINDEX, TROPONINI in the last 168 hours. BNP (last 3 results) No results for input(s): PROBNP in the last 8760 hours. HbA1C: No results for input(s): HGBA1C in the last 72 hours. CBG: No results for input(s): GLUCAP in the last 168 hours. Lipid Profile: Recent Labs    03/19/20 1201  TRIG 95   Thyroid Function Tests: No results for input(s): TSH, T4TOTAL, FREET4, T3FREE, THYROIDAB in the last 72 hours. Anemia Panel: Recent Labs    03/19/20 1201  FERRITIN 80   Urine analysis:    Component Value Date/Time   COLORURINE YELLOW 07/16/2018 0423   APPEARANCEUR CLEAR 07/16/2018 0423   LABSPEC 1.008 07/16/2018 0423  PHURINE 6.0 07/16/2018 0423   GLUCOSEU NEGATIVE 07/16/2018 0423   HGBUR NEGATIVE 07/16/2018 0423   BILIRUBINUR NEGATIVE 07/16/2018 0423   KETONESUR NEGATIVE 07/16/2018 0423   PROTEINUR NEGATIVE 07/16/2018 0423   NITRITE NEGATIVE 07/16/2018 0423   LEUKOCYTESUR NEGATIVE 07/16/2018 0423   Sepsis Labs: !!!!!!!!!!!!!!!!!!!!!!!!!!!!!!!!!!!!!!!!!!!! @LABRCNTIP (procalcitonin:4,lacticidven:4) ) Recent Results (from the past 240 hour(s))  SARS Coronavirus 2 by RT PCR (hospital order, performed in [facility] hospital lab) Nasopharyngeal Nasopharyngeal Swab     Status: Abnormal   Collection Time: 03/19/20 12:01 PM   Specimen: Nasopharyngeal Swab  Result Value Ref Range Status   SARS Coronavirus 2 POSITIVE (A) NEGATIVE Final    Comment: RESULT CALLED TO, READ BACK BY AND VERIFIED WITH: WILSON,G. @1414  03/19/20 BILLINGSLEY,L (NOTE) SARS-CoV-2 target nucleic acids are DETECTED  SARS-CoV-2 RNA is generally detectable in upper respiratory specimens  during the acute phase of infection.  Positive results are indicative  of the presence of the identified virus, but do not  rule out bacterial infection or co-infection with other pathogens not detected by the test.  Clinical correlation with patient history and  other diagnostic information is necessary to determine patient infection status.  The expected result is negative.  Fact Sheet for Patients:     Fact Sheet for Healthcare Providers:   03/21/20    This test is not yet approved or cleared by the [employer] FDA and  has been authorized for detection and/or diagnosis of SARS-CoV-2 by FDA under an Emergency Use Authorization (EUA).  This EUA will remain in effect (meaning this  test can be used) for the duration of  the COVID-19 declaration under Section 564(b)(1) of the Act, 21 U.S.C. section 360-bbb-3(b)(1), unless the authorization is terminated or revoked sooner.  Performed at [facility], [address] [place], [place]      Radiological Exams on Admission: DG Chest Port 1 View  Result Date: 03/19/2020 CLINICAL DATA:  Cough EXAM: PORTABLE CHEST 1 VIEW COMPARISON:  07/15/2018 FINDINGS: Cardiac and mediastinal contours normal. Vascularity normal. Negative for edema or effusion. Mild streaky density in the right lung base likely atelectasis. Previously noted right upper lobe infiltrate has resolved. IMPRESSION: Mild right lower lobe atelectasis.  Negative for pneumonia Electronically Signed   By: 03/21/2020 M.D.   On: 03/19/2020 12:21    EKG: Independently reviewed. Most recent EKG from 6/8 sinus with QTc 470  Assessment/Plan Principal Problem:   Dehydration Active Problems:   MDD (major depressive disorder), severe (HCC)   COVID-19 virus infection   1. COVID-19 infection 1. Patient is unvaccinated 2. Chest x-ray reviewed, clear.  Patient is on minimal O2 support 3. Inflammatory markers mildly positive with CRP in excess of 3 4. We will continue patient with vitamin C and  zinc 5. Discussed monoclonal antibody infusion with patient.  Patient states that she wants to think about it 6. Follow inflammatory marker trends 2. Dehydration 1. Patient reports not eating well for the past 2 to 3 days prior to this hospital visit secondary to effects of COVID-19 2. Clinically dehydrated on exam 3. Continue IV fluid hydration as tolerated 4. repeat basic metabolic panel in the morning 3. Hyponatremia 1. Given dehydration, likely hypovolemic hyponatremia 2. Continue IV fluid hydration 3. Repeat basic metabolic panel in the morning 4. History of depression 1. Seems stable at this time  DVT prophylaxis: Lovenox Code Status: Full Family Communication: Pt in room  Disposition Plan:   Consults called:  Admission status: Observation  as will likely require less than 2 midnight stay to hydrate patient for dehydration  Rickey Barbara MD [facility] Pager On Amion  If 7PM-7AM, please contact night-coverage  03/19/2020, 3:58 PM

## 2020-03-19 NOTE — Progress Notes (Signed)
   03/19/20 2013  Assess: MEWS Score  Temp 100.3 F (37.9 C)  BP 109/78  Pulse Rate (!) 119  Resp (!) 28  Level of Consciousness Alert  SpO2 91 %  O2 Device Room Air  Assess: MEWS Score  MEWS Temp 0  MEWS Systolic 0  MEWS Pulse 2  MEWS RR 2  MEWS LOC 0  MEWS Score 4  MEWS Score Color Red  Assess: if the MEWS score is Yellow or Red  Were vital signs taken at a resting state? Yes  Focused Assessment Change from prior assessment (see assessment flowsheet)  Early Detection of Sepsis Score *See Row Information* Low  MEWS guidelines implemented *See Row Information* Yes  Treat  MEWS Interventions Administered scheduled meds/treatments;Administered prn meds/treatments;Escalated (See documentation below)  Pain Scale 0-10  Pain Score 9  Pain Type Acute pain  Pain Location Abdomen  Pain Orientation Right;Left;Upper;Mid;Lower  Pain Intervention(s) Medication (See eMAR);RN made aware  Take Vital Signs  Increase Vital Sign Frequency  Red: Q 1hr X 4 then Q 4hr X 4, if remains red, continue Q 4hrs  Notify: Charge Nurse/RN  Name of Charge Nurse/RN Notified REnee, RN   Date Charge Nurse/RN Notified 03/19/20  Time Charge Nurse/RN Notified 2025  Notify: Provider  Provider Name/Title Cherylin Mylar NP   Date Provider Notified 03/19/20  Time Provider Notified 2020  Notification Type Page  Notification Reason Change in status  Arrived from ED in RED MEWS - Initiated protocol prn & scheduled meds to be given, placing pt on O2 @2lnc

## 2020-03-19 NOTE — ED Notes (Signed)
MD made aware of trending BP. Will obtain manual BP and recheck temperature, per request.

## 2020-03-19 NOTE — ED Provider Notes (Signed)
[facility]-EMERGENCY DEPT Provider Note   CSN: [id number] Arrival date & time: 03/19/20  1111     History Chief Complaint  Patient presents with  . Cough  . Weakness    Sheri Hall is a 49 y.o. female.  49 year old female who presents with several days of worsening cough and congestion.  Cough is been productive and has been also experiencing weakness.  Had emesis x1 but denies any diarrhea.  Chills but no reported fever.  EMS called and patient given half liter of saline and transported here.        Past Medical History:  Diagnosis Date  . Anxiety   . Arthritis    rheumatoid   . Asthma    seasonal  . Bipolar disorder (HCC)   . Depression   . GERD (gastroesophageal reflux disease)   . OCD (obsessive compulsive disorder)   . PTSD (post-traumatic stress disorder)     Patient Active Problem List   Diagnosis Date Noted  . Bipolar I disorder, most recent episode mixed (HCC) 12/29/2019  . Major depressive disorder, recurrent episode (HCC) 12/28/2019  . Lobar pneumonia (HCC) 07/17/2018  . Normocytic anemia 07/17/2018  . Cocaine use 07/17/2018  . Rheumatoid arthritis (HCC) 07/17/2018  . Sepsis (HCC) 07/16/2018  . PTSD (post-traumatic stress disorder) 09/17/2017  . Substance use disorder 09/17/2017  . Grief 09/17/2017  . MDD (major depressive disorder), severe (HCC) 09/16/2017    Past Surgical History:  Procedure Laterality Date  . CESAREAN SECTION    . OPEN REDUCTION INTERNAL FIXATION (ORIF) DISTAL RADIAL FRACTURE Left 10/15/2015   Procedure: OPEN TREATMENT OF LEFT DISTAL RADIUS FRACTURE;  Surgeon: Mack Hook, MD;  Location: [facility];  Service: Orthopedics;  Laterality: Left;     OB History   No obstetric history on file.     Family History  Problem Relation Age of Onset  . Healthy Mother   . Healthy Father     Social History   Tobacco Use  . Smoking status: Current Every Day Smoker    Packs/day: 0.50     Types: Cigarettes  . Smokeless tobacco: Never Used  Vaping Use  . Vaping Use: Never used  Substance Use Topics  . Alcohol use: Yes    Comment: occ; liquor  . Drug use: Yes    Types: Cocaine, "Crack" cocaine    Comment: wednesday March 22nd 2017    Home Medications Prior to Admission medications   Medication Sig Start Date End Date Taking? Authorizing Provider  ARIPiprazole (ABILIFY) 15 MG tablet Take 1 tablet (15 mg total) by mouth daily. For mood control 01/05/20   Armandina Stammer I, NP  cyclobenzaprine (FLEXERIL) 10 MG tablet Take 1 tablet (10 mg total) by mouth 2 (two) times daily as needed for muscle spasms. 03/05/20   Moshe Cipro, NP  ibuprofen (ADVIL) 600 MG tablet Take 1 tablet (600 mg total) by mouth every 6 (six) hours as needed. 03/05/20   Moshe Cipro, NP  pantoprazole (PROTONIX) 40 MG tablet Take 1 tablet (40 mg total) by mouth daily. For acid reflux 01/05/20   Armandina Stammer I, NP  traZODone (DESYREL) 50 MG tablet Take 1 tablet (50 mg total) by mouth at bedtime as needed for sleep. 01/04/20   Armandina Stammer I, NP  venlafaxine XR (EFFEXOR-XR) 75 MG 24 hr capsule Take 1 capsule (75 mg total) by mouth daily with breakfast. For depression 01/05/20   Sanjuana Kava, NP    Allergies  Patient has no known allergies.  Review of Systems   Review of Systems  All other systems reviewed and are negative.   Physical Exam Updated Vital Signs BP 104/76   Pulse 98   Temp 98.4 F (36.9 C)   Resp (!) 24   SpO2 98%   Physical Exam Vitals and nursing note reviewed.  Constitutional:      General: She is not in acute distress.    Appearance: Normal appearance. She is well-developed. She is not toxic-appearing.  HENT:     Head: Normocephalic and atraumatic.  Eyes:     General: Lids are normal.     Conjunctiva/sclera: Conjunctivae normal.     Pupils: Pupils are equal, round, and reactive to light.  Neck:     Thyroid: No thyroid mass.     Trachea: No tracheal deviation.    Cardiovascular:     Rate and Rhythm: Normal rate and regular rhythm.     Heart sounds: Normal heart sounds. No murmur heard.  No gallop.   Pulmonary:     Effort: Pulmonary effort is normal. Tachypnea present. No respiratory distress.     Breath sounds: No stridor. Decreased breath sounds and wheezing present. No rhonchi or rales.  Abdominal:     General: Bowel sounds are normal. There is no distension.     Palpations: Abdomen is soft.     Tenderness: There is no abdominal tenderness. There is no rebound.  Musculoskeletal:        General: No tenderness. Normal range of motion.     Cervical back: Normal range of motion and neck supple.  Skin:    General: Skin is warm and dry.     Findings: No abrasion or rash.  Neurological:     Mental Status: She is alert and oriented to person, place, and time.     GCS: GCS eye subscore is 4. GCS verbal subscore is 5. GCS motor subscore is 6.     Cranial Nerves: No cranial nerve deficit.     Sensory: No sensory deficit.  Psychiatric:        Speech: Speech normal.        Behavior: Behavior normal.     ED Results / Procedures / Treatments   Labs (all labs ordered are listed, but only abnormal results are displayed) Labs Reviewed  SARS CORONAVIRUS 2 BY RT PCR (HOSPITAL ORDER, PERFORMED IN [facility])    EKG None  Radiology No results found.  Procedures Procedures (including critical care time)  Medications Ordered in ED Medications - No data to display  ED Course  I have reviewed the triage vital signs and the nursing notes.  Pertinent labs & imaging results that were available during my care of the patient were reviewed by me and considered in my medical decision making (see chart for details).    MDM Rules/Calculators/A&P                          Patient given IV fluids here and endorses weakness after being in her bed for the last 6 days.  Has no energy.  She is Covid positive.  Will admit for observation Final  Clinical Impression(s) / ED Diagnoses Final diagnoses:  None    Rx / DC Orders ED Discharge Orders    None       Lorre Nick, MD 03/19/20 1448
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
## 2020-03-19 NOTE — ED Notes (Signed)
Date and time results received: 03/19/20 2:14 PM   Test: COVID-19 Critical Value: positive  Name of Provider Notified: Freida Busman, MD   Orders Received? Or Actions Taken?: Orders Received - See Orders for details

## 2020-03-19 NOTE — ED Notes (Signed)
Patient given meal tray.

## 2020-03-19 NOTE — ED Notes (Signed)
Hospitalist at bedside 

## 2020-03-20 DIAGNOSIS — R112 Nausea with vomiting, unspecified: Secondary | ICD-10-CM | POA: Diagnosis present

## 2020-03-20 DIAGNOSIS — M069 Rheumatoid arthritis, unspecified: Secondary | ICD-10-CM | POA: Diagnosis present

## 2020-03-20 DIAGNOSIS — E871 Hypo-osmolality and hyponatremia: Secondary | ICD-10-CM | POA: Diagnosis present

## 2020-03-20 DIAGNOSIS — J1282 Pneumonia due to coronavirus disease 2019: Secondary | ICD-10-CM | POA: Diagnosis present

## 2020-03-20 DIAGNOSIS — E860 Dehydration: Secondary | ICD-10-CM | POA: Diagnosis not present

## 2020-03-20 DIAGNOSIS — F429 Obsessive-compulsive disorder, unspecified: Secondary | ICD-10-CM | POA: Diagnosis present

## 2020-03-20 DIAGNOSIS — F319 Bipolar disorder, unspecified: Secondary | ICD-10-CM | POA: Diagnosis present

## 2020-03-20 DIAGNOSIS — J45909 Unspecified asthma, uncomplicated: Secondary | ICD-10-CM | POA: Diagnosis present

## 2020-03-20 DIAGNOSIS — K219 Gastro-esophageal reflux disease without esophagitis: Secondary | ICD-10-CM | POA: Diagnosis present

## 2020-03-20 DIAGNOSIS — Z79899 Other long term (current) drug therapy: Secondary | ICD-10-CM | POA: Diagnosis not present

## 2020-03-20 DIAGNOSIS — E8809 Other disorders of plasma-protein metabolism, not elsewhere classified: Secondary | ICD-10-CM | POA: Diagnosis present

## 2020-03-20 DIAGNOSIS — U071 COVID-19: Secondary | ICD-10-CM | POA: Diagnosis not present

## 2020-03-20 DIAGNOSIS — J9601 Acute respiratory failure with hypoxia: Secondary | ICD-10-CM | POA: Diagnosis present

## 2020-03-20 DIAGNOSIS — F4310 Post-traumatic stress disorder, unspecified: Secondary | ICD-10-CM | POA: Diagnosis present

## 2020-03-20 DIAGNOSIS — F17210 Nicotine dependence, cigarettes, uncomplicated: Secondary | ICD-10-CM | POA: Diagnosis present

## 2020-03-20 DIAGNOSIS — E861 Hypovolemia: Secondary | ICD-10-CM | POA: Diagnosis present

## 2020-03-20 LAB — COMPREHENSIVE METABOLIC PANEL
ALT: 27 U/L (ref 0–44)
AST: 45 U/L — ABNORMAL HIGH (ref 15–41)
Albumin: 1.9 g/dL — ABNORMAL LOW (ref 3.5–5.0)
Alkaline Phosphatase: 33 U/L — ABNORMAL LOW (ref 38–126)
Anion gap: 6 (ref 5–15)
BUN: 15 mg/dL (ref 6–20)
CO2: 24 mmol/L (ref 22–32)
Calcium: 7.4 mg/dL — ABNORMAL LOW (ref 8.9–10.3)
Chloride: 101 mmol/L (ref 98–111)
Creatinine, Ser: 0.66 mg/dL (ref 0.44–1.00)
GFR calc Af Amer: >60 mL/min (ref >60)
GFR calc non Af Amer: >60 mL/min (ref >60)
Glucose, Bld: 92 mg/dL (ref 70–99)
Potassium: 3.9 mmol/L (ref 3.5–5.1)
Sodium: 131 mmol/L — ABNORMAL LOW (ref 135–145)
Total Bilirubin: 0.1 mg/dL — ABNORMAL LOW (ref 0.3–1.2)
Total Protein: 8.9 g/dL — ABNORMAL HIGH (ref 6.5–8.1)

## 2020-03-20 LAB — URINALYSIS, ROUTINE W REFLEX MICROSCOPIC
Bilirubin Urine: NEGATIVE
Glucose, UA: NEGATIVE mg/dL
Hgb urine dipstick: NEGATIVE
Ketones, ur: NEGATIVE mg/dL
Leukocytes,Ua: NEGATIVE
Nitrite: NEGATIVE
Protein, ur: NEGATIVE mg/dL
Specific Gravity, Urine: 1.01 (ref 1.005–1.030)
pH: 6 (ref 5.0–8.0)

## 2020-03-20 LAB — CBC WITH DIFFERENTIAL/PLATELET
Abs Immature Granulocytes: 0.03 10*3/uL (ref 0.00–0.07)
Basophils Absolute: 0 10*3/uL (ref 0.0–0.1)
Basophils Relative: 0 %
Eosinophils Absolute: 0.1 10*3/uL (ref 0.0–0.5)
Eosinophils Relative: 1 %
HCT: 28.6 % — ABNORMAL LOW (ref 36.0–46.0)
Hemoglobin: 8.9 g/dL — ABNORMAL LOW (ref 12.0–15.0)
Immature Granulocytes: 0 %
Lymphocytes Relative: 53 %
Lymphs Abs: 3.7 10*3/uL (ref 0.7–4.0)
MCH: 27.2 pg (ref 26.0–34.0)
MCHC: 31.1 g/dL (ref 30.0–36.0)
MCV: 87.5 fL (ref 80.0–100.0)
Monocytes Absolute: 0.6 10*3/uL (ref 0.1–1.0)
Monocytes Relative: 8 %
Neutro Abs: 2.7 10*3/uL (ref 1.7–7.7)
Neutrophils Relative %: 38 %
Platelets: 243 10*3/uL (ref 150–400)
RBC: 3.27 MIL/uL — ABNORMAL LOW (ref 3.87–5.11)
RDW: 15.4 % (ref 11.5–15.5)
WBC: 7.1 10*3/uL (ref 4.0–10.5)
nRBC: 0 % (ref 0.0–0.2)

## 2020-03-20 LAB — GLUCOSE, CAPILLARY
Glucose-Capillary: 89 mg/dL (ref 70–99)
Glucose-Capillary: 96 mg/dL (ref 70–99)

## 2020-03-20 LAB — C-REACTIVE PROTEIN: CRP: 3.2 mg/dL — ABNORMAL HIGH (ref <1.0)

## 2020-03-20 LAB — D-DIMER, QUANTITATIVE: D-Dimer, Quant: <0.27 ug/mL-FEU (ref 0.00–0.50)

## 2020-03-20 LAB — FERRITIN: Ferritin: 75 ng/mL (ref 11–307)

## 2020-03-20 MED ORDER — PANTOPRAZOLE SODIUM 40 MG PO TBEC
40.0000 mg | DELAYED_RELEASE_TABLET | Freq: Every day | ORAL | Status: DC
  Administered 2020-03-20 – 2020-03-26 (×7): 40 mg via ORAL
  Filled 2020-03-20 (×7): qty 1

## 2020-03-20 MED ORDER — LIP MEDEX EX OINT
TOPICAL_OINTMENT | CUTANEOUS | Status: AC
  Filled 2020-03-20: qty 7

## 2020-03-20 MED ORDER — LIP MEDEX EX OINT: TOPICAL_OINTMENT | CUTANEOUS | Status: DC | PRN

## 2020-03-20 NOTE — Evaluation (Signed)
Physical Therapy Evaluation Patient Details Name: Sheri Hall MRN: [id number] DOB: 04-14-1971 Today's Date: 03/20/2020   History of Present Illness  49 yo female admitted with COVID  Clinical Impression  On eval, pt required Min assist for mobility. She walked ~25 feet with support of IV pole. O2 92% on RA but dyspnea 3/4 with minimal activity. Seated rest breaks taken/needed during session. Will plan to follow and progress activity as tolerated. May need to maximize home health assistance for safe return home, if possible.     Follow Up Recommendations Home health PT;Intermittent Supervision/Assistance    Equipment Recommendations  Rolling walker with 5" wheels    Recommendations for Other Services       Precautions / Restrictions Precautions Precautions: Fall Restrictions Weight Bearing Restrictions: No      Mobility  Bed Mobility Overal bed mobility: Modified Independent             General bed mobility comments: Increased time with rest break needed once EOB 2* dyspnea.  Transfers Overall transfer level: Needs assistance   Transfers: Sit to/from Stand Sit to Stand: Min assist         General transfer comment: Assist to steady. Increased time.  Ambulation/Gait Ambulation/Gait assistance: Min assist Gait Distance (Feet): 25 Feet Assistive device: IV Pole Gait Pattern/deviations: Step-through pattern;Decreased stride length     General Gait Details: Unsteady. Assist to stabilize throughout distance. Dyspnea 3/4. O2 92% on RA. Pt fatigues easily.  Stairs            Wheelchair Mobility    Modified Rankin (Stroke Patients Only)       Balance Overall balance assessment: Needs assistance           Standing balance-Leahy Scale: Fair                               Pertinent Vitals/Pain Pain Assessment: Faces Faces Pain Scale: Hurts little more Pain Location: abdomen Pain Descriptors / Indicators: Discomfort;Sore Pain  Intervention(s): Monitored during session;Limited activity within patient's tolerance    Home Living Family/patient expects to be discharged to:: Private residence Living Arrangements: Children   Type of Home: House       Home Layout: One level Home Equipment: None      Prior Function Level of Independence: Independent               Hand Dominance        Extremity/Trunk Assessment   Upper Extremity Assessment Upper Extremity Assessment: Overall WFL for tasks assessed    Lower Extremity Assessment Lower Extremity Assessment: Generalized weakness    Cervical / Trunk Assessment Cervical / Trunk Assessment: Normal  Communication   Communication: No difficulties  Cognition Arousal/Alertness: Awake/alert Behavior During Therapy: WFL for tasks assessed/performed Overall Cognitive Status: Within Functional Limits for tasks assessed                                        General Comments      Exercises     Assessment/Plan    PT Assessment Patient needs continued PT services  PT Problem List Decreased mobility;Decreased strength;Decreased activity tolerance;Decreased balance;Decreased knowledge of use of DME       PT Treatment Interventions DME instruction;Gait training;Therapeutic activities;Therapeutic exercise;Patient/family education;Balance training;Functional mobility training    PT Goals (Current goals can be found in the Care  Plan section)  Acute Rehab PT Goals Patient Stated Goal: to continue getting better PT Goal Formulation: With patient Time For Goal Achievement: 04/03/20 Potential to Achieve Goals: Good    Frequency Min 3X/week   Barriers to discharge Decreased caregiver support      Co-evaluation               AM-PAC PT "6 Clicks" Mobility  Outcome Measure Help needed turning from your back to your side while in a flat bed without using bedrails?: None Help needed moving from lying on your back to sitting on  the side of a flat bed without using bedrails?: None Help needed moving to and from a bed to a chair (including a wheelchair)?: A Little Help needed standing up from a chair using your arms (e.g., wheelchair or bedside chair)?: A Little Help needed to walk in hospital room?: A Little Help needed climbing 3-5 steps with a railing? : A Little 6 Click Score: 20    End of Session   Activity Tolerance: Patient limited by fatigue Patient left: in bed;with call bell/phone within reach;with bed alarm set   PT Visit Diagnosis: Unsteadiness on feet (R26.81);Muscle weakness (generalized) (M62.81);Difficulty in walking, not elsewhere classified (R26.2)    Time: 0768-0881 PT Time Calculation (min) (ACUTE ONLY): 19 min   Charges:   PT Evaluation $PT Eval Low Complexity: 1 Low            Faye Ramsay, PT Acute Rehabilitation  Office: [phone] Pager: [phone]

## 2020-03-20 NOTE — Progress Notes (Signed)
   03/19/20 2313  Assess: MEWS Score  Temp 97.9 F (36.6 C)  BP 99/68  Pulse Rate (!) 101  Resp 20  Level of Consciousness Alert  SpO2 97 %  Assess: MEWS Score  MEWS Temp 0  MEWS Systolic 1  MEWS Pulse 1  MEWS RR 0  MEWS LOC 0  MEWS Score 2  MEWS Score Color Yellow  Assess: if the MEWS score is Yellow or Red  Were vital signs taken at a resting state? Yes  Focused Assessment No change from prior assessment  Early Detection of Sepsis Score *See Row Information* Low  MEWS guidelines implemented *See Row Information* No, previously red, continue vital signs every 4 hours  Treat  MEWS Interventions Other (Comment) (continue  protocol )  Pain Scale 0-10  Pain Score 0  Patients response to intervention Effective  Document  Patient Outcome Stabilized after interventions  This set of VS is just after finishing NS bolus. Pt reports feeling better. Will cont to monitor

## 2020-03-20 NOTE — Progress Notes (Signed)
   03/19/20 2222  Assess: MEWS Score  Temp 97.8 F (36.6 C)  BP (!) 89/59  Pulse Rate (!) 104  Resp 18  Level of Consciousness Alert  SpO2 96 %  O2 Device Nasal Cannula  O2 Flow Rate (L/min) 2 L/min  Assess: MEWS Score  MEWS Temp 0  MEWS Systolic 1  MEWS Pulse 1  MEWS RR 0  MEWS LOC 0  MEWS Score 2  MEWS Score Color Yellow  Assess: if the MEWS score is Yellow or Red  Were vital signs taken at a resting state? Yes  Focused Assessment No change from prior assessment  Early Detection of Sepsis Score *See Row Information* Low  MEWS guidelines implemented *See Row Information* No, previously red, continue vital signs every 4 hours  Treat  MEWS Interventions Administered prn meds/treatments (fluid bolus started )  Pain Score 7  Updated Cherylin Mylar NP of VSs as this set of VS is prior to NS bolus. Will update after 500cc NS bolus

## 2020-03-20 NOTE — Progress Notes (Signed)
SATURATION QUALIFICATIONS: (This note is used to comply with regulatory documentation for home oxygen)  Patient Saturations on Room Air at Rest = 93%  Patient Saturations on Room Air while Ambulating = 92%  

## 2020-03-20 NOTE — Progress Notes (Signed)
500cc NS bolus has been ordered

## 2020-03-20 NOTE — Progress Notes (Signed)
PROGRESS NOTE  Sheri Hall  DOB: 1971/06/16  PCP: Norm [place], [place] GHW:[id number]  DOA: 03/19/2020  LOS: 0 days   Chief Complaint  Patient presents with  . Cough  . Weakness    Brief narrative: Sheri Hall is a 48 y.o. female with PMH of rheumatoid arthritis, seasonal asthma, GERD, multiple psychiatric disorders including anxiety, depression, bipolar disorder, OCD, PTSD. Patient presented to the ED on 03/19/2020 with 1 week history of shortness of breath, productive cough, nausea, poor oral intake, generalized malaise and weakness.   She is not vaccinated against COVID-19.    In the ED, patient was afebrile, heart rate in 20s, blood pressure in 90s and low 100s. Initially she was maintaining oxygen saturation on room air but later she required 2 L by nasal cannula. Covid PCR positive. Chest x-ray unremarkable. Inflammatory markers were mildly elevated Sodium level low at 129. Patient looks clinically dehydrated and was hence kept under observation  Subjective: Patient was seen and examined this morning. Thin built middle-aged African-American female. Sleeping.  Opens eyes on verbal command.  Looks very lethargic.  Low pitched voice.  Slow head movements.  Continues to feel tired. Chart reviewed. On 2 L oxygen by nasal cannula.  Assessment/Plan: COVID-19 infection Acute respiratory failure with hypoxia -Presented with respiratory symptoms. -Only slightly elevated inflammatory markers and normal chest x-ray. -She was not started on remdesivir or steroids on admission. -Currently on low-flow oxygen by nasal cannula with oxygen saturation at 100%.   -Continue wean down as tolerated.  Lab Results  Component Value Date   SARSCOV2NAA POSITIVE (A) 03/19/2020   SARSCOV2NAA NEGATIVE 12/27/2019    Recent Labs  Lab 03/19/20 1201 03/19/20 2041 03/20/20 0402  WBC 8.0 7.5 7.1  LATICACIDVEN 1.2  --   --   PROCALCITON <0.10  --   --   DDIMER <0.27  --  <0.27    FERRITIN 80  --  75  LDH 229*  --   --   CRP 3.9*  --  3.2*  ALT 34  --  27   Profound generalized weakness -Covid related.  I wonder if it is also because of her partially treated/untreated multiple psychiatric disorders. -Looks very weak on my examination.  Needs PT evaluation. -Currently on normal saline at 75 mill per hour.  Continue the same. -Obtain urinalysis.  Hyponatremia -Likely hypovolemic hyponatremia. -Present improving on normal saline. Recent Labs  Lab 03/19/20 1201 03/20/20 0402  NA 129* 131*   Hypoalbuminemia -Albumin level low at 1.9 this morning.   -Reports poor oral intake.   -Nutrition consult  multiple psychiatric disorders (anxiety, depression, bipolar disorder, OCD, PTSD) -On hydroxyzine, propanolol. -Patient states he was supposed to be on other medicines as well but she could not go for a follow-up visit because of weakness.  GERD -Continue PPI  Mobility: PT eval Code Status:   Code Status: Full Code  Nutritional status: Body mass index is 24.19 kg/m.     Diet Order            Diet regular Room service appropriate? Yes; Fluid consistency: Thin  Diet effective now                 DVT prophylaxis: enoxaparin (LOVENOX) injection 40 mg Start: 03/19/20 1600   Antimicrobials:  None Fluid: Normal saline at 75 ml/h Consultants: None Family Communication:  Not at bedside  Status is: Observation  The patient will require care spanning > 2 midnights and should be moved to inpatient  because -patient is very weak, lethargic, on supplemental oxygen.  Needs IV fluid, PT eval, further evaluation and management in the hospital. Dispo: The patient is from: Home              Anticipated d/c is to: Home hopefully              Anticipated d/c date is: 2 days              Patient currently is not medically stable to d/c.     Infusions:  . sodium chloride 75 mL/hr at 03/20/20 0439    Scheduled Meds: . albuterol  2 puff Inhalation Q6H  .  vitamin C  500 mg Oral Daily  . enoxaparin (LOVENOX) injection  40 mg Subcutaneous Q24H  . hydrOXYzine  25 mg Oral QHS  . pyridOXINE  100 mg Oral Daily  . zinc sulfate  220 mg Oral Daily    Antimicrobials: Anti-infectives (From admission, onward)   None      PRN meds: acetaminophen **OR** acetaminophen, chlorpheniramine-HYDROcodone, guaiFENesin-dextromethorphan, ondansetron **OR** ondansetron (ZOFRAN) IV, propranolol   Objective: Vitals:   03/19/20 2313 03/20/20 0323  BP: 99/68 95/67  Pulse: (!) 101 80  Resp: 20 18  Temp: 97.9 F (36.6 C) 97.6 F (36.4 C)  SpO2: 97% 96%    Intake/Output Summary (Last 24 hours) at 03/20/2020 0732 Last data filed at 03/19/2020 2224 Gross per 24 hour  Intake 240 ml  Output --  Net 240 ml   Filed Weights   03/19/20 1528  Weight: 58.1 kg   Weight change:  Body mass index is 24.19 kg/m.   Physical Exam: General exam: Weak, lethargic. Skin: No rashes, lesions or ulcers. HEENT: Atraumatic, normocephalic, supple neck, no obvious bleeding Lungs: Shallow respiratory effort but clear to auscultation bilaterally CVS: Regular rate and rhythm, no murmur GI/Abd soft, nontender, nondistended, bowel sound present CNS: Lethargic, opens eyes on verbal command.  Slow to answer.  Low pitched voice Psychiatry: Depressed look Extremities: No pedal edema, no calf tenderness  Data Review: I have personally reviewed the laboratory data and studies available.  Recent Labs  Lab 03/19/20 1201 03/19/20 2041 03/20/20 0402  WBC 8.0 7.5 7.1  NEUTROABS 3.7  --  2.7  HGB 11.1* 10.3* 8.9*  HCT 35.1* 32.5* 28.6*  MCV 87.1 86.9 87.5  PLT 267 260 243   Recent Labs  Lab 03/19/20 1201 03/19/20 2041 03/20/20 0402  NA 129*  --  131*  K 4.5  --  3.9  CL 98  --  101  CO2 25  --  24  GLUCOSE 93  --  92  BUN 16  --  15  CREATININE 0.95 0.80 0.66  CALCIUM 7.9*  --  7.4*   Signed, Lorin Glass, MD [facility] Pager: [phone] (Secure Chat  preferred). 03/20/2020

## 2020-03-21 LAB — CBC WITH DIFFERENTIAL/PLATELET
Abs Immature Granulocytes: 0.03 10*3/uL (ref 0.00–0.07)
Basophils Absolute: 0 10*3/uL (ref 0.0–0.1)
Basophils Relative: 0 %
Eosinophils Absolute: 0.2 10*3/uL (ref 0.0–0.5)
Eosinophils Relative: 2 %
HCT: 26.6 % — ABNORMAL LOW (ref 36.0–46.0)
Hemoglobin: 8.3 g/dL — ABNORMAL LOW (ref 12.0–15.0)
Immature Granulocytes: 0 %
Lymphocytes Relative: 50 %
Lymphs Abs: 3.6 10*3/uL (ref 0.7–4.0)
MCH: 27.9 pg (ref 26.0–34.0)
MCHC: 31.2 g/dL (ref 30.0–36.0)
MCV: 89.3 fL (ref 80.0–100.0)
Monocytes Absolute: 0.5 10*3/uL (ref 0.1–1.0)
Monocytes Relative: 8 %
Neutro Abs: 2.9 10*3/uL (ref 1.7–7.7)
Neutrophils Relative %: 40 %
Platelets: 233 10*3/uL (ref 150–400)
RBC: 2.98 MIL/uL — ABNORMAL LOW (ref 3.87–5.11)
RDW: 15.7 % — ABNORMAL HIGH (ref 11.5–15.5)
WBC: 7.2 10*3/uL (ref 4.0–10.5)
nRBC: 0 % (ref 0.0–0.2)

## 2020-03-21 LAB — COMPREHENSIVE METABOLIC PANEL
ALT: 26 U/L (ref 0–44)
AST: 45 U/L — ABNORMAL HIGH (ref 15–41)
Albumin: 1.9 g/dL — ABNORMAL LOW (ref 3.5–5.0)
Alkaline Phosphatase: 30 U/L — ABNORMAL LOW (ref 38–126)
Anion gap: 6 (ref 5–15)
BUN: 7 mg/dL (ref 6–20)
CO2: 22 mmol/L (ref 22–32)
Calcium: 7.2 mg/dL — ABNORMAL LOW (ref 8.9–10.3)
Chloride: 104 mmol/L (ref 98–111)
Creatinine, Ser: 0.56 mg/dL (ref 0.44–1.00)
GFR calc Af Amer: >60 mL/min (ref >60)
GFR calc non Af Amer: >60 mL/min (ref >60)
Glucose, Bld: 88 mg/dL (ref 70–99)
Potassium: 3.9 mmol/L (ref 3.5–5.1)
Sodium: 132 mmol/L — ABNORMAL LOW (ref 135–145)
Total Bilirubin: 0.3 mg/dL (ref 0.3–1.2)
Total Protein: 8.2 g/dL — ABNORMAL HIGH (ref 6.5–8.1)

## 2020-03-21 LAB — FERRITIN: Ferritin: 67 ng/mL (ref 11–307)

## 2020-03-21 LAB — D-DIMER, QUANTITATIVE: D-Dimer, Quant: <0.27 ug/mL-FEU (ref 0.00–0.50)

## 2020-03-21 LAB — GLUCOSE, CAPILLARY: Glucose-Capillary: 104 mg/dL — ABNORMAL HIGH (ref 70–99)

## 2020-03-21 LAB — C-REACTIVE PROTEIN: CRP: <0.5 mg/dL (ref <1.0)

## 2020-03-21 MED ORDER — ENSURE ENLIVE PO LIQD
237.0000 mL | Freq: Two times a day (BID) | ORAL | Status: DC
  Administered 2020-03-21: 237 mL via ORAL

## 2020-03-21 MED ORDER — ADULT MULTIVITAMIN W/MINERALS CH
1.0000 | ORAL_TABLET | Freq: Every day | ORAL | Status: DC
  Administered 2020-03-21 – 2020-03-26 (×6): 1 via ORAL
  Filled 2020-03-21 (×6): qty 1

## 2020-03-21 NOTE — Progress Notes (Signed)
Initial Nutrition Assessment  INTERVENTION:   -Ensure Enlive po BID, each supplement provides 350 kcal and 20 grams of protein -Multivitamin with minerals daily  NUTRITION DIAGNOSIS:   Increased nutrient needs related to acute illness (COVID-19 infection) as evidenced by estimated needs.  GOAL:   Patient will meet greater than or equal to 90% of their needs  MONITOR:   PO intake, Supplement acceptance, Labs, Weight trends, I & O's  REASON FOR ASSESSMENT:   Consult Assessment of nutrition requirement/status  ASSESSMENT:   49 y.o. female with medical history significant of cough, nausea, decreased appetite for approximately 1 week prior to this hospital visit.  Patient reports productive cough with shortness of breath.  Also decreased appetite, worse over the past 2 to 3 days prior to this hospital visit.  Patient also reports generalized malaise and increasing weakness.  Patient currently consuming 100% of meals at this time. Pt has been positive for COVID-19 since 8/30. Pt has been having symptoms for about a week, reporting poor appetite for 2-3 days PTA. Pt was having some N/V as well.  Will order Ensure supplements and daily MVI.  Per weight records, weights have remained stable.   Medications: Vitamin C, Vitamin B-6, Zinc sulfate Labs reviewed: CBGs: 96-104 Low Na  NUTRITION - FOCUSED PHYSICAL EXAM:  Unable to complete  Diet Order:   Diet Order            Diet regular Room service appropriate? Yes; Fluid consistency: Thin  Diet effective now                 EDUCATION NEEDS:   No education needs have been identified at this time  Skin:  Skin Assessment: Reviewed RN Assessment  Last BM:  8/29  Height:   Ht Readings from Last 1 Encounters:  03/19/20 5\' 1"  (1.549 m)    Weight:   Wt Readings from Last 1 Encounters:  03/19/20 58.1 kg   BMI:  Body mass index is 24.19 kg/m.  Estimated Nutritional Needs:   Kcal:  1700-1900  Protein:   80-90g  Fluid:  1.9L/day  03/21/20, MS, RD, LDN Inpatient Clinical Dietitian Contact information available via Amion

## 2020-03-21 NOTE — Progress Notes (Signed)
PROGRESS NOTE    Sheri Hall  WSF:[id number] DOB: 1971/01/08 DOA: 03/19/2020 PCP: Norm Salt, PA    Brief Narrative:  Sheri Hall is a 49 y.o. female with PMH of rheumatoid arthritis, seasonal asthma, GERD, multiple psychiatric disorders including anxiety, depression, bipolar disorder, OCD, PTSD who presented to the ED on 03/19/2020 with 1 week history of shortness of breath, productive cough, nausea, poor oral intake, generalized malaise and weakness.    She is not vaccinated against COVID-19.    In the ED, patient was afebrile, heart rate in 20s, blood pressure in 90s and low 100s. Initially she was maintaining oxygen saturation on room air but later she required 2 L by nasal cannula. Covid PCR positive. Chest x-ray unremarkable. Inflammatory markers were mildly elevated. Sodium level low at 129. TRH consulted for admission.    Assessment & Plan:   Principal Problem:   Dehydration Active Problems:   MDD (major depressive disorder), severe (HCC)   COVID-19 virus infection   Acute hypoxic respiratory failure secondary to acute Covid-19 viral pneumonia during the ongoing 2020 Covid 19 Pandemic - POA Patient presenting to ED with progressive shortness of breath and weakness/fatigue.  Chest x-ray notable for right lower lobe atelectasis; no infiltrate. --COVID test: PCR + 03/19/2020 --CRP 3.2, <0.05 --ddimer <0.27, <0.27 --Patient continues to contemplate monoclonal antibody infusion --prone for 2-3hrs every 12hrs if able --Continue supplemental oxygen, titrate to maintain SPO2 greater than 92% --Continue supportive care with albuterol MDI prn, vitamin C, zinc, Tylenol, antitussives (benzonatate/ Mucinex/Tussionex) --Follow CBC, CMP, D-dimer, ferritin, and CRP daily --Continue airborne/contact isolation precautions for 3 weeks from the day of diagnosis  The treatment plan and use of medications and known side effects were discussed with patient/family. Some of the  medications used are based on case reports/anecdotal data.  All other medications being used in the management of COVID-19 based on limited study data.  Complete risks and long-term side effects are unknown, however in the best clinical judgment they seem to be of some benefit.  Patient wanted to proceed with treatment options provided.  Profound generalized weakness -Covid related.  I wonder if it is also because of her partially treated/untreated multiple psychiatric disorders. -Looks very weak on my examination.  Needs PT evaluation. -Currently on normal saline at 75 mill per hour.  Continue the same. -Obtain urinalysis.  Hyponatremia Sodium 129 admission, likely secondary to hypovolemic hyponatremia. --Na 129>132 --continue hydration with NS @ 57mL/hr --BMP daily  Hypoalbuminemia Albumin level low at 1.9, etiology likely secondary to poor oral intake.   --Nutrition following, appreciate assistance --Continue supplementation  multiple psychiatric disorders (anxiety, depression, bipolar disorder, OCD, PTSD) --Continue home hydroxyzine, propanolol 10mg  BID. --Patient states she was supposed to be on other medicines as well but she could not go for a follow-up visit because of weakness. --Outpatient follow-up with PCP/psychiatry  GERD -Continue PPI  DVT prophylaxis: Lovenox Code Status: Full code Family Communication: Updated patient extensively at bedside  Disposition Plan:  Status is: Inpatient  Remains inpatient appropriate because:Persistent severe electrolyte disturbances, Ongoing diagnostic testing needed not appropriate for outpatient work up, Unsafe d/c plan, IV treatments appropriate due to intensity of illness or inability to take PO and Inpatient level of care appropriate due to severity of illness   Dispo: The patient is from: Home              Anticipated d/c is to: Home              Anticipated d/c  date is: 2 days              Patient currently is not  medically stable to d/c.    Consultants:   none  Procedures:   none  Antimicrobials:   none   Subjective: Patient seen and examined bedside, resting comfortably.  Continues with profound weakness/fatigue.  Also with very poor oral intake.  Also reports continued dyspnea at rest.  No other complaints or concerns at this time.  Denies headache, no fever/chills/night sweats, no nausea cefonicid diarrhea, no chest pain, no palpitations, no abdominal pain.  No acute events overnight per nursing staff.  Objective: Vitals:   03/20/20 1426 03/20/20 1517 03/20/20 2032 03/21/20 0329  BP: 112/71 109/69 111/70 (!) 88/55  Pulse: 96  (!) 104 84  Resp: 20 18 17 15   Temp: 98.1 F (36.7 C) 98 F (36.7 C) 98.9 F (37.2 C) (!) 97.5 F (36.4 C)  TempSrc: Oral Oral Oral Oral  SpO2: 98% 98% 92% 98%  Weight:      Height:        Intake/Output Summary (Last 24 hours) at 03/21/2020 1343 Last data filed at 03/21/2020 0000 Gross per 24 hour  Intake 2163.32 ml  Output 300 ml  Net 1863.32 ml   Filed Weights   03/19/20 1528  Weight: 58.1 kg    Examination:  General exam: Appears calm and comfortable  Respiratory system: Clear to auscultation. Respiratory effort normal.  On 2 L nasal cannula with SPO2 98%. Cardiovascular system: S1 & S2 heard, RRR. No JVD, murmurs, rubs, gallops or clicks. No pedal edema. Gastrointestinal system: Abdomen is nondistended, soft and nontender. No organomegaly or masses felt. Normal bowel sounds heard. Central nervous system: Alert and oriented. No focal neurological deficits. Extremities: Symmetric 5 x 5 power. Skin: No rashes, lesions or ulcers Psychiatry: Judgement and insight appear normal. Mood & affect appropriate.     Data Reviewed: I have personally reviewed following labs and imaging studies  CBC: Recent Labs  Lab 03/19/20 1201 03/19/20 2041 03/20/20 0402 03/21/20 0339  WBC 8.0 7.5 7.1 7.2  NEUTROABS 3.7  --  2.7 2.9  HGB 11.1* 10.3* 8.9*  8.3*  HCT 35.1* 32.5* 28.6* 26.6*  MCV 87.1 86.9 87.5 89.3  PLT 267 260 243 233   Basic Metabolic Panel: Recent Labs  Lab 03/19/20 1201 03/19/20 2041 03/20/20 0402 03/21/20 0339  NA 129*  --  131* 132*  K 4.5  --  3.9 3.9  CL 98  --  101 104  CO2 25  --  24 22  GLUCOSE 93  --  92 88  BUN 16  --  15 7  CREATININE 0.95 0.80 0.66 0.56  CALCIUM 7.9*  --  7.4* 7.2*   GFR: Estimated Creatinine Clearance: 69.7 mL/min (by C-G formula based on SCr of 0.56 mg/dL). Liver Function Tests: Recent Labs  Lab 03/19/20 1201 03/20/20 0402 03/21/20 0339  AST 56* 45* 45*  ALT 34 27 26  ALKPHOS 39 33* 30*  BILITOT 0.3 0.1* 0.3  PROT 10.3* 8.9* 8.2*  ALBUMIN 2.2* 1.9* 1.9*   No results for input(s): LIPASE, AMYLASE in the last 168 hours. No results for input(s): AMMONIA in the last 168 hours. Coagulation Profile: No results for input(s): INR, PROTIME in the last 168 hours. Cardiac Enzymes: No results for input(s): CKTOTAL, CKMB, CKMBINDEX, TROPONINI in the last 168 hours. BNP (last 3 results) No results for input(s): PROBNP in the last 8760 hours. HbA1C: No results for input(s):  HGBA1C in the last 72 hours. CBG: Recent Labs  Lab 03/20/20 0842 03/20/20 1132 03/21/20 1157  GLUCAP 89 96 104*   Lipid Profile: Recent Labs    03/19/20 1201  TRIG 95   Thyroid Function Tests: No results for input(s): TSH, T4TOTAL, FREET4, T3FREE, THYROIDAB in the last 72 hours. Anemia Panel: Recent Labs    03/20/20 0402 03/21/20 0339  FERRITIN 75 67   Sepsis Labs: Recent Labs  Lab 03/19/20 1201  PROCALCITON <0.10  LATICACIDVEN 1.2    Recent Results (from the past 240 hour(s))  Blood Culture (routine x 2)     Status: None (Preliminary result)   Collection Time: 03/19/20 12:00 PM   Specimen: BLOOD  Result Value Ref Range Status   Specimen Description   Final    BLOOD LEFT ANTECUBITAL Performed at [facility], [address], [place], [place] [address]    Special Requests    Final    BOTTLES DRAWN AEROBIC AND ANAEROBIC Blood Culture adequate volume Performed at [facility], [address], [place], [place] [address]    Culture   Final    NO GROWTH 2 DAYS Performed at [facility], [address], [place], [place] [address]    Report Status PENDING  Incomplete  SARS Coronavirus 2 by RT PCR (hospital order, performed in [facility] hospital lab) Nasopharyngeal Nasopharyngeal Swab     Status: Abnormal   Collection Time: 03/19/20 12:01 PM   Specimen: Nasopharyngeal Swab  Result Value Ref Range Status   SARS Coronavirus 2 POSITIVE (A) NEGATIVE Final    Comment: RESULT CALLED TO, READ BACK BY AND VERIFIED WITH: WILSON,G. @1414  03/19/20 BILLINGSLEY,L (NOTE) SARS-CoV-2 target nucleic acids are DETECTED  SARS-CoV-2 RNA is generally detectable in upper respiratory specimens  during the acute phase of infection.  Positive results are indicative  of the presence of the identified virus, but do not rule out bacterial infection or co-infection with other pathogens not detected by the test.  Clinical correlation with patient history and  other diagnostic information is necessary to determine patient infection status.  The expected result is negative.  Fact Sheet for Patients:   BoilerBrush.com.cy   Fact Sheet for Healthcare Providers:   https://pope.com/    This test is not yet approved or cleared by the [place] FDA and  has been authorized for detection and/or diagnosis of SARS-CoV-2 by FDA under an Emergency Use Authorization (EUA).  This EUA will remain in effect (meaning this  test can be used) for the duration of  the COVID-19 declaration under Section 564(b)(1) of the Act, 21 U.S.C. section 360-bbb-3(b)(1), unless the authorization is terminated or revoked sooner.  Performed at [facility], [address], [place], [place] [address]           Radiology Studies: No results found.      Scheduled Meds: . albuterol  2 puff Inhalation Q6H  . vitamin C  500 mg Oral Daily  . enoxaparin (LOVENOX) injection  40 mg Subcutaneous Q24H  . feeding supplement (ENSURE ENLIVE)  237 mL Oral BID BM  . hydrOXYzine  25 mg Oral QHS  . multivitamin with minerals  1 tablet Oral Daily  . pantoprazole  40 mg Oral Daily  . pyridOXINE  100 mg Oral Daily  . zinc sulfate  220 mg Oral Daily   Continuous Infusions: . sodium chloride 75 mL/hr at 03/21/20 0647     LOS: 1 day  Time spent: 39 minutes spent on chart review, discussion with nursing staff, consultants, updating family and interview/physical exam; more than 50% of that time was spent in counseling and/or coordination of care.    Alvira Philips [place], DO [facility] Available via Epic secure chat 7am-7pm After these hours, please refer to coverage provider listed on [email or web address] 03/21/2020, 1:43 PM

## 2020-03-21 NOTE — Evaluation (Signed)
Occupational Therapy Evaluation Patient Details Name: Kynli Chou MRN: [id number] DOB: 1971-06-05 Today's Date: 03/21/2020    History of Present Illness Stormy Hafer is a 49 y.o. female with PMH of rheumatoid arthritis, seasonal asthma, GERD, multiple psychiatric disorders including anxiety, depression, bipolar disorder, OCD, PTSD.4 Admitted with COVID   Clinical Impression   Ms. Pakou Labreck is a 49 year old women admitted to hospital with COVID who presents supine in bed and on 1 L [place]. On evaluation she presents with generalized weakness and decreased activity tolerance resulting in impaired ability to perform independent mobility and ADLs. Patient complaining of significant fatigue and reports coughing with activity. On evaluation o2 sats maintained at 96% with activity on 1 L. Patient demonstrated ability to perform bed mobility and min guard for standing. Patient only able to stand approx 15 seconds before returning to seated position and wanting to return to supine. Patient demonstrated ability to donn socks at edge of bed. Patient set up assistance and requiring predominantly seated position for ADLs and min guard for standing during ADLs. Patient will benefit from skilled OT services to improve deficits and learn compensatory strategies in order to return home at discharge.    Follow Up Recommendations  Home health OT    Equipment Recommendations  Tub/shower seat    Recommendations for Other Services       Precautions / Restrictions Precautions Precautions: Fall Restrictions Weight Bearing Restrictions: No      Mobility Bed Mobility Overal bed mobility: Modified Independent             General bed mobility comments: increased time. Reports fatigue and shortness of breath and movement initiates coughing spell.  Transfers Overall transfer level: Needs assistance   Transfers: Sit to/from Stand Sit to Stand: Min guard         General transfer comment:  Patient able to perform sit to stand and take steps to head of bed with min guard. Only able to stand approx 15 seconds before quick return to sitting with patient reporting fatigue.    Balance Overall balance assessment: Mild deficits observed, not formally tested Sitting-balance support: No upper extremity supported;Feet unsupported Sitting balance-Leahy Scale: Good     Standing balance support: No upper extremity supported Standing balance-Leahy Scale: Fair                             ADL either performed or assessed with clinical judgement   ADL Overall ADL's : Needs assistance/impaired Eating/Feeding: Set up   Grooming: Set up   Upper Body Bathing: Set up;Sitting   Lower Body Bathing: Set up;Min guard;Sit to/from stand   Upper Body Dressing : Set up;Sitting   Lower Body Dressing: Min guard;Set up;Sit to/from stand   Toilet Transfer: Min guard;Ambulation;Regular Teacher, adult education Details (indicate cue type and reason): Patient reports ambulating to bathroom with nursing with min guard.   Toileting - Clothing Manipulation Details (indicate cue type and reason): Patient reports min guard with toileting. ABle to manage clothing and wiping.     Functional mobility during ADLs: Min guard       Vision Patient Visual Report: No change from baseline Vision Assessment?: No apparent visual deficits     Perception     Praxis      Pertinent Vitals/Pain Pain Assessment: Faces Faces Pain Scale: Hurts a little bit Pain Location: stomach Pain Descriptors / Indicators: Sore Pain Intervention(s): Monitored during session     Hand  Dominance Right   Extremity/Trunk Assessment Upper Extremity Assessment Upper Extremity Assessment: Generalized weakness (arthritic changes in bilateral hands.)   Lower Extremity Assessment Lower Extremity Assessment: Defer to PT evaluation   Cervical / Trunk Assessment Cervical / Trunk Assessment: Normal   Communication  Communication Communication: No difficulties   Cognition Arousal/Alertness: Awake/alert Behavior During Therapy: WFL for tasks assessed/performed Overall Cognitive Status: Within Functional Limits for tasks assessed                                     General Comments       Exercises     Shoulder Instructions      Home Living Family/patient expects to be discharged to:: Private residence Living Arrangements: Children (Son) Available Help at Discharge: Family;Available PRN/intermittently Type of Home: House Home Access: Stairs to enter [employer] of Steps: 0   Home Layout: One level     Bathroom Shower/Tub: Chief Strategy Officer: Standard Bathroom Accessibility: No   Home Equipment: None          Prior Functioning/Environment Level of Independence: Independent                 OT Problem List: Decreased strength;Decreased activity tolerance;Decreased knowledge of use of DME or AE;Cardiopulmonary status limiting activity      OT Treatment/Interventions: Self-care/ADL training;Therapeutic exercise;DME and/or AE instruction;Energy conservation;Therapeutic activities;Balance training;Patient/family education    OT Goals(Current goals can be found in the care plan section) Acute Rehab OT Goals Patient Stated Goal: Get stronger OT Goal Formulation: With patient Time For Goal Achievement: 04/04/20 Potential to Achieve Goals: Good  OT Frequency: Min 2X/week   Barriers to D/C: Decreased caregiver support          Co-evaluation              AM-PAC OT "6 Clicks" Daily Activity     Outcome Measure Help from another person eating meals?: None Help from another person taking care of personal grooming?: A Little Help from another person toileting, which includes using toliet, bedpan, or urinal?: A Little Help from another person bathing (including washing, rinsing, drying)?: A Little Help from another person to put on  and taking off regular upper body clothing?: A Little Help from another person to put on and taking off regular lower body clothing?: A Little 6 Click Score: 19   End of Session Nurse Communication: Mobility status  Activity Tolerance: Patient limited by fatigue Patient left: in bed;with call bell/phone within reach;with bed alarm set  OT Visit Diagnosis: Unsteadiness on feet (R26.81);Muscle weakness (generalized) (M62.81)                Time: 1062-6948 OT Time Calculation (min): 15 min Charges:  OT General Charges $OT Visit: 1 Visit OT Evaluation $OT Eval Low Complexity: 1 Low  Emberley Kral, OTR/L Acute Care Rehab Services  Office [phone] Pager: [phone]   Kelli Churn 03/21/2020, 1:28 PM

## 2020-03-22 ENCOUNTER — Other Ambulatory Visit: Payer: Self-pay

## 2020-03-22 ENCOUNTER — Encounter (HOSPITAL_COMMUNITY): Payer: Self-pay | Admitting: Internal Medicine

## 2020-03-22 LAB — CBC WITH DIFFERENTIAL/PLATELET
Abs Immature Granulocytes: 0.03 10*3/uL (ref 0.00–0.07)
Basophils Absolute: 0 10*3/uL (ref 0.0–0.1)
Basophils Relative: 0 %
Eosinophils Absolute: 0.1 10*3/uL (ref 0.0–0.5)
Eosinophils Relative: 2 %
HCT: 28.2 % — ABNORMAL LOW (ref 36.0–46.0)
Hemoglobin: 8.6 g/dL — ABNORMAL LOW (ref 12.0–15.0)
Immature Granulocytes: 1 %
Lymphocytes Relative: 50 %
Lymphs Abs: 2.4 10*3/uL (ref 0.7–4.0)
MCH: 27.5 pg (ref 26.0–34.0)
MCHC: 30.5 g/dL (ref 30.0–36.0)
MCV: 90.1 fL (ref 80.0–100.0)
Monocytes Absolute: 0.4 10*3/uL (ref 0.1–1.0)
Monocytes Relative: 7 %
Neutro Abs: 1.9 10*3/uL (ref 1.7–7.7)
Neutrophils Relative %: 40 %
Platelets: 254 10*3/uL (ref 150–400)
RBC: 3.13 MIL/uL — ABNORMAL LOW (ref 3.87–5.11)
RDW: 15.7 % — ABNORMAL HIGH (ref 11.5–15.5)
WBC: 4.8 10*3/uL (ref 4.0–10.5)
nRBC: 0 % (ref 0.0–0.2)

## 2020-03-22 LAB — COMPREHENSIVE METABOLIC PANEL
ALT: 27 U/L (ref 0–44)
AST: 44 U/L — ABNORMAL HIGH (ref 15–41)
Albumin: 1.7 g/dL — ABNORMAL LOW (ref 3.5–5.0)
Alkaline Phosphatase: 31 U/L — ABNORMAL LOW (ref 38–126)
Anion gap: 4 — ABNORMAL LOW (ref 5–15)
BUN: 6 mg/dL (ref 6–20)
CO2: 25 mmol/L (ref 22–32)
Calcium: 7.4 mg/dL — ABNORMAL LOW (ref 8.9–10.3)
Chloride: 107 mmol/L (ref 98–111)
Creatinine, Ser: 0.47 mg/dL (ref 0.44–1.00)
GFR calc Af Amer: >60 mL/min (ref >60)
GFR calc non Af Amer: >60 mL/min (ref >60)
Glucose, Bld: 86 mg/dL (ref 70–99)
Potassium: 3.7 mmol/L (ref 3.5–5.1)
Sodium: 136 mmol/L (ref 135–145)
Total Bilirubin: 0.2 mg/dL — ABNORMAL LOW (ref 0.3–1.2)
Total Protein: 8 g/dL (ref 6.5–8.1)

## 2020-03-22 LAB — D-DIMER, QUANTITATIVE: D-Dimer, Quant: 0.59 ug/mL-FEU — ABNORMAL HIGH (ref 0.00–0.50)

## 2020-03-22 LAB — C-REACTIVE PROTEIN: CRP: 3 mg/dL — ABNORMAL HIGH (ref <1.0)

## 2020-03-22 LAB — FERRITIN: Ferritin: 63 ng/mL (ref 11–307)

## 2020-03-22 MED ORDER — SODIUM CHLORIDE 0.9 % IV SOLN
200.0000 mg | Freq: Once | INTRAVENOUS | Status: AC
  Administered 2020-03-22: 200 mg via INTRAVENOUS
  Filled 2020-03-22: qty 200

## 2020-03-22 MED ORDER — SODIUM CHLORIDE 0.9 % IV SOLN
100.0000 mg | Freq: Every day | INTRAVENOUS | Status: AC
  Administered 2020-03-23 – 2020-03-26 (×4): 100 mg via INTRAVENOUS
  Filled 2020-03-22 (×4): qty 20

## 2020-03-22 MED ORDER — SODIUM CHLORIDE 0.9 % IV SOLN
100.0000 mg | INTRAVENOUS | Status: AC
  Administered 2020-03-22 (×2): 100 mg via INTRAVENOUS
  Filled 2020-03-22 (×2): qty 20

## 2020-03-22 MED ORDER — HYDROCOD POLST-CPM POLST ER 10-8 MG/5ML PO SUER: 5.0000 mL | Freq: Two times a day (BID) | ORAL | Status: DC | PRN

## 2020-03-22 MED ORDER — ENSURE ENLIVE PO LIQD
237.0000 mL | Freq: Two times a day (BID) | ORAL | Status: DC
  Administered 2020-03-23 (×2): 237 mL via ORAL

## 2020-03-22 MED ORDER — ZINC SULFATE 220 (50 ZN) MG PO CAPS: 220.0000 mg | ORAL_CAPSULE | Freq: Every day | ORAL | Status: DC

## 2020-03-22 MED ORDER — METHYLPREDNISOLONE SODIUM SUCC 40 MG IJ SOLR
40.0000 mg | Freq: Three times a day (TID) | INTRAMUSCULAR | Status: DC
  Administered 2020-03-22 – 2020-03-24 (×6): 40 mg via INTRAVENOUS
  Filled 2020-03-22 (×6): qty 1

## 2020-03-22 MED ORDER — IPRATROPIUM BROMIDE HFA 17 MCG/ACT IN AERS
2.0000 | INHALATION_SPRAY | RESPIRATORY_TRACT | Status: DC
  Administered 2020-03-22 – 2020-03-23 (×2): 2 via RESPIRATORY_TRACT
  Filled 2020-03-22: qty 12.9

## 2020-03-22 NOTE — Progress Notes (Signed)
PHYSICAL THERAPY  Attempted to see twice today.  1. Am "my breathing just got good" from using BSC 2. PM "I just got washed up" and requesting to rest and eat.  Pt has been evaluated with rec for D/C to home with [facility] PT  Felecia Shelling  PTA Acute  Rehabilitation Services Pager      [phone] Office      [phone]

## 2020-03-22 NOTE — Progress Notes (Signed)
PROGRESS NOTE    Sheri Hall  PZW:[id number] DOB: 09-Nov-1970 DOA: 03/19/2020 PCP: Norm Salt, PA    Brief Narrative:  Sheri Hall is a 49 y.o. female with PMH of rheumatoid arthritis, seasonal asthma, GERD, multiple psychiatric disorders including anxiety, depression, bipolar disorder, OCD, PTSD who presented to the ED on 03/19/2020 with 1 week history of shortness of breath, productive cough, nausea, poor oral intake, generalized malaise and weakness.    She is not vaccinated against COVID-19.    In the ED, patient was afebrile, heart rate in 20s, blood pressure in 90s and low 100s. Initially she was maintaining oxygen saturation on room air but later she required 2 L by nasal cannula. Covid PCR positive. Chest x-ray unremarkable. Inflammatory markers were mildly elevated. Sodium level low at 129. TRH consulted for admission.    Assessment & Plan:   Principal Problem:   Dehydration Active Problems:   MDD (major depressive disorder), severe (HCC)   COVID-19 virus infection   Acute hypoxic respiratory failure secondary to acute Covid-19 viral pneumonia during the ongoing 2020 Covid 19 Pandemic - POA Patient presenting to ED with progressive shortness of breath and weakness/fatigue.  Chest x-ray notable for right lower lobe atelectasis; no infiltrate. --COVID test: PCR + 03/19/2020 --CRP 3.2, <0.05, 3.0 --ddimer <0.27, <0.27, 0.59 --Remdesivir, plan 5-day course (Day #1/5 ) --Continue Solumedrol 40mg  IV q8h --prone for 2-3hrs every 12hrs if able --Continue supplemental oxygen, titrate to maintain SPO2 greater than 92% --Continue supportive care with albuterol MDI prn, vitamin C, zinc, Tylenol, antitussives (benzonatate/ Mucinex/Tussionex) --Follow CBC, CMP, D-dimer, ferritin, and CRP daily --Continue airborne/contact isolation precautions for 3 weeks from the day of diagnosis  The treatment plan and use of medications and known side effects were discussed with  patient/family. Some of the medications used are based on case reports/anecdotal data.  All other medications being used in the management of COVID-19 based on limited study data.  Complete risks and long-term side effects are unknown, however in the best clinical judgment they seem to be of some benefit.  Patient wanted to proceed with treatment options provided.  Profound generalized weakness Etiology likely secondary to acute Covid-19 viral infection, also likely confounded by history of partially treated/untreated multiple psychiatric disorders. --Continue NS @ 63mL/hr --Continue PT/OT efforts  Hyponatremia Sodium 129 admission, likely secondary to hypovolemic hyponatremia. --Na 72m --continue hydration with NS @ 51mL/hr --BMP daily  Hypoalbuminemia Albumin level low at 1.9, etiology likely secondary to poor oral intake.   --Nutrition following, appreciate assistance --Continue supplementation  multiple psychiatric disorders (anxiety, depression, bipolar disorder, OCD, PTSD) --Continue home hydroxyzine, propanolol 10mg  BID. --Patient states she was supposed to be on other medicines as well but she could not go for a follow-up visit because of weakness. --Outpatient follow-up with PCP/psychiatry  GERD -Continue PPI  DVT prophylaxis: Lovenox Code Status: Full code Family Communication: Updated patient extensively at bedside; no patient contacts in the EMR  Disposition Plan:  Status is: Inpatient  Remains inpatient appropriate because:Persistent severe electrolyte disturbances, Ongoing diagnostic testing needed not appropriate for outpatient work up, Unsafe d/c plan, IV treatments appropriate due to intensity of illness or inability to take PO and Inpatient level of care appropriate due to severity of illness   Dispo: The patient is from: Home              Anticipated d/c is to: Home              Anticipated d/c date is: >  3 days              Patient currently is  not medically stable to d/c.    Consultants:   none  Procedures:   none  Antimicrobials:   Remdesivir 9/2>>   Subjective: Patient seen and examined bedside, resting comfortably.  Continues with profound weakness/fatigue and shortness of breath.  Now on 1 L nasal cannula.  Also with very poor oral intake.  Inflammatory markers trending up.  No other complaints or concerns at this time.  Denies headache, no fever/chills/night sweats, no nausea cefonicid diarrhea, no chest pain, no palpitations, no abdominal pain.  No acute events overnight per nursing staff.  Objective: Vitals:   03/21/20 1419 03/21/20 1928 03/21/20 2027 03/22/20 0324  BP: 99/60 106/66  98/68  Pulse: 88 86  64  Resp: 18 15  18   Temp: 97.9 F (36.6 C) 97.7 F (36.5 C)  97.6 F (36.4 C)  TempSrc: Oral Oral  Oral  SpO2: 100% 99% 98% 100%  Weight:      Height:        Intake/Output Summary (Last 24 hours) at 03/22/2020 1121 Last data filed at 03/22/2020 0940 Gross per 24 hour  Intake 480 ml  Output --  Net 480 ml   Filed Weights   03/19/20 1528  Weight: 58.1 kg    Examination:  General exam: Appears calm and comfortable  Respiratory system: Clear to auscultation. Respiratory effort normal.  On 1 L nasal cannula with SPO2 100%. Cardiovascular system: S1 & S2 heard, RRR. No JVD, murmurs, rubs, gallops or clicks. No pedal edema. Gastrointestinal system: Abdomen is nondistended, soft and nontender. No organomegaly or masses felt. Normal bowel sounds heard. Central nervous system: Alert and oriented. No focal neurological deficits. Extremities: Symmetric 5 x 5 power. Skin: No rashes, lesions or ulcers Psychiatry: Judgement and insight appear normal. Mood & affect appropriate.     Data Reviewed: I have personally reviewed following labs and imaging studies  CBC: Recent Labs  Lab 03/19/20 1201 03/19/20 2041 03/20/20 0402 03/21/20 0339 03/22/20 0407  WBC 8.0 7.5 7.1 7.2 4.8  NEUTROABS 3.7  --  2.7  2.9 1.9  HGB 11.1* 10.3* 8.9* 8.3* 8.6*  HCT 35.1* 32.5* 28.6* 26.6* 28.2*  MCV 87.1 86.9 87.5 89.3 90.1  PLT 267 260 243 233 254   Basic Metabolic Panel: Recent Labs  Lab 03/19/20 1201 03/19/20 2041 03/20/20 0402 03/21/20 0339 03/22/20 0407  NA 129*  --  131* 132* 136  K 4.5  --  3.9 3.9 3.7  CL 98  --  101 104 107  CO2 25  --  24 22 25   GLUCOSE 93  --  92 88 86  BUN 16  --  15 7 6   CREATININE 0.95 0.80 0.66 0.56 0.47  CALCIUM 7.9*  --  7.4* 7.2* 7.4*   GFR: Estimated Creatinine Clearance: 69.7 mL/min (by C-G formula based on SCr of 0.47 mg/dL). Liver Function Tests: Recent Labs  Lab 03/19/20 1201 03/20/20 0402 03/21/20 0339 03/22/20 0407  AST 56* 45* 45* 44*  ALT 34 27 26 27   ALKPHOS 39 33* 30* 31*  BILITOT 0.3 0.1* 0.3 0.2*  PROT 10.3* 8.9* 8.2* 8.0  ALBUMIN 2.2* 1.9* 1.9* 1.7*   No results for input(s): LIPASE, AMYLASE in the last 168 hours. No results for input(s): AMMONIA in the last 168 hours. Coagulation Profile: No results for input(s): INR, PROTIME in the last 168 hours. Cardiac Enzymes: No results for input(s): CKTOTAL,  CKMB, CKMBINDEX, TROPONINI in the last 168 hours. BNP (last 3 results) No results for input(s): PROBNP in the last 8760 hours. HbA1C: No results for input(s): HGBA1C in the last 72 hours. CBG: Recent Labs  Lab 03/20/20 0842 03/20/20 1132 03/21/20 1157  GLUCAP 89 96 104*   Lipid Profile: Recent Labs    03/19/20 1201  TRIG 95   Thyroid Function Tests: No results for input(s): TSH, T4TOTAL, FREET4, T3FREE, THYROIDAB in the last 72 hours. Anemia Panel: Recent Labs    03/21/20 0339 03/22/20 0407  FERRITIN 67 63   Sepsis Labs: Recent Labs  Lab 03/19/20 1201  PROCALCITON <0.10  LATICACIDVEN 1.2    Recent Results (from the past 240 hour(s))  Blood Culture (routine x 2)     Status: None (Preliminary result)   Collection Time: 03/19/20 12:00 PM   Specimen: BLOOD  Result Value Ref Range Status   Specimen Description    Final    BLOOD LEFT ANTECUBITAL Performed at [facility], [address], [place], [place] [address]    Special Requests   Final    BOTTLES DRAWN AEROBIC AND ANAEROBIC Blood Culture adequate volume Performed at [facility], [address] [place], [place] [address]    Culture   Final    NO GROWTH 3 DAYS Performed at [facility], [address] [place], [place] [address]    Report Status PENDING  Incomplete  SARS Coronavirus 2 by RT PCR (hospital order, performed in [facility] hospital lab) Nasopharyngeal Nasopharyngeal Swab     Status: Abnormal   Collection Time: 03/19/20 12:01 PM   Specimen: Nasopharyngeal Swab  Result Value Ref Range Status   SARS Coronavirus 2 POSITIVE (A) NEGATIVE Final    Comment: RESULT CALLED TO, READ BACK BY AND VERIFIED WITH: WILSON,G. @1414  03/19/20 BILLINGSLEY,L (NOTE) SARS-CoV-2 target nucleic acids are DETECTED  SARS-CoV-2 RNA is generally detectable in upper respiratory specimens  during the acute phase of infection.  Positive results are indicative  of the presence of the identified virus, but do not rule out bacterial infection or co-infection with other pathogens not detected by the test.  Clinical correlation with patient history and  other diagnostic information is necessary to determine patient infection status.  The expected result is negative.  Fact Sheet for Patients:   03/21/20   Fact Sheet for Healthcare Providers:   BoilerBrush.com.cy    This test is not yet approved or cleared by the https://pope.com/ FDA and  has been authorized for detection and/or diagnosis of SARS-CoV-2 by FDA under an Emergency Use Authorization (EUA).  This EUA will remain in effect (meaning this  test can be used) for the duration of  the COVID-19 declaration under Section 564(b)(1) of the Act, 21 U.S.C. section 360-bbb-3(b)(1), unless the authorization is terminated or  revoked sooner.  Performed at [facility], [address] [place], [place]          Radiology Studies: No results found.      Scheduled Meds: . albuterol  2 puff Inhalation Q6H  . vitamin C  500 mg Oral Daily  . enoxaparin (LOVENOX) injection  40 mg Subcutaneous Q24H  . feeding supplement (ENSURE ENLIVE)  237 mL Oral BID BM  . hydrOXYzine  25 mg Oral QHS  . methylPREDNISolone (SOLU-MEDROL) injection  40 mg Intravenous Q8H  . multivitamin with minerals  1 tablet Oral Daily  . pantoprazole  40 mg Oral Daily  .  pyridOXINE  100 mg Oral Daily  . zinc sulfate  220 mg Oral Daily   Continuous Infusions: . sodium chloride 75 mL/hr at 03/21/20 2232  . [START ON 03/23/2020] remdesivir 100 mg in NS 100 mL    . remdesivir 100 mg in NS 100 mL       LOS: 2 days    Time spent: 38 minutes spent on chart review, discussion with nursing staff, consultants, updating family and interview/physical exam; more than 50% of that time was spent in counseling and/or coordination of care.    Alvira Philips [place], DO [facility] Available via Epic secure chat 7am-7pm After these hours, please refer to coverage provider listed on [email or web address] 03/22/2020, 11:21 AM

## 2020-03-23 LAB — COMPREHENSIVE METABOLIC PANEL
ALT: 31 U/L (ref 0–44)
AST: 46 U/L — ABNORMAL HIGH (ref 15–41)
Albumin: 1.8 g/dL — ABNORMAL LOW (ref 3.5–5.0)
Alkaline Phosphatase: 41 U/L (ref 38–126)
Anion gap: 6 (ref 5–15)
BUN: 9 mg/dL (ref 6–20)
CO2: 22 mmol/L (ref 22–32)
Calcium: 7.6 mg/dL — ABNORMAL LOW (ref 8.9–10.3)
Chloride: 107 mmol/L (ref 98–111)
Creatinine, Ser: 0.47 mg/dL (ref 0.44–1.00)
GFR calc Af Amer: >60 mL/min (ref >60)
GFR calc non Af Amer: >60 mL/min (ref >60)
Glucose, Bld: 160 mg/dL — ABNORMAL HIGH (ref 70–99)
Potassium: 4.4 mmol/L (ref 3.5–5.1)
Sodium: 135 mmol/L (ref 135–145)
Total Bilirubin: 0.3 mg/dL (ref 0.3–1.2)
Total Protein: 8.2 g/dL — ABNORMAL HIGH (ref 6.5–8.1)

## 2020-03-23 LAB — CBC WITH DIFFERENTIAL/PLATELET
Abs Immature Granulocytes: 0.04 10*3/uL (ref 0.00–0.07)
Basophils Absolute: 0 10*3/uL (ref 0.0–0.1)
Basophils Relative: 0 %
Eosinophils Absolute: 0 10*3/uL (ref 0.0–0.5)
Eosinophils Relative: 0 %
HCT: 27 % — ABNORMAL LOW (ref 36.0–46.0)
Hemoglobin: 8.3 g/dL — ABNORMAL LOW (ref 12.0–15.0)
Immature Granulocytes: 1 %
Lymphocytes Relative: 27 %
Lymphs Abs: 1.7 10*3/uL (ref 0.7–4.0)
MCH: 27.9 pg (ref 26.0–34.0)
MCHC: 30.7 g/dL (ref 30.0–36.0)
MCV: 90.9 fL (ref 80.0–100.0)
Monocytes Absolute: 0.2 10*3/uL (ref 0.1–1.0)
Monocytes Relative: 3 %
Neutro Abs: 4.2 10*3/uL (ref 1.7–7.7)
Neutrophils Relative %: 69 %
Platelets: 292 10*3/uL (ref 150–400)
RBC: 2.97 MIL/uL — ABNORMAL LOW (ref 3.87–5.11)
RDW: 15.7 % — ABNORMAL HIGH (ref 11.5–15.5)
WBC: 6.1 10*3/uL (ref 4.0–10.5)
nRBC: 0 % (ref 0.0–0.2)

## 2020-03-23 LAB — FERRITIN: Ferritin: 56 ng/mL (ref 11–307)

## 2020-03-23 LAB — C-REACTIVE PROTEIN: CRP: 2.8 mg/dL — ABNORMAL HIGH (ref <1.0)

## 2020-03-23 LAB — D-DIMER, QUANTITATIVE: D-Dimer, Quant: 0.31 ug/mL-FEU (ref 0.00–0.50)

## 2020-03-23 MED ORDER — IPRATROPIUM BROMIDE HFA 17 MCG/ACT IN AERS
2.0000 | INHALATION_SPRAY | Freq: Four times a day (QID) | RESPIRATORY_TRACT | Status: DC
  Administered 2020-03-23 – 2020-03-26 (×11): 2 via RESPIRATORY_TRACT

## 2020-03-23 NOTE — Progress Notes (Signed)
PROGRESS NOTE    Sheri Hall  EXH:[id number] DOB: Aug 11, 1970 DOA: 03/19/2020 PCP: Norm Salt, PA    Brief Narrative:  Sheri Hall is a 49 y.o. female with PMH of rheumatoid arthritis, seasonal asthma, GERD, multiple psychiatric disorders including anxiety, depression, bipolar disorder, OCD, PTSD who presented to the ED on 03/19/2020 with 1 week history of shortness of breath, productive cough, nausea, poor oral intake, generalized malaise and weakness.    She is not vaccinated against COVID-19.    In the ED, patient was afebrile, heart rate in 20s, blood pressure in 90s and low 100s. Initially she was maintaining oxygen saturation on room air but later she required 2 L by nasal cannula. Covid PCR positive. Chest x-ray unremarkable. Inflammatory markers were mildly elevated. Sodium level low at 129. TRH consulted for admission.    Assessment & Plan:   Principal Problem:   Dehydration Active Problems:   MDD (major depressive disorder), severe (HCC)   COVID-19 virus infection   Acute hypoxic respiratory failure secondary to acute Covid-19 viral pneumonia during the ongoing 2020 Covid 19 Pandemic - POA Patient presenting to ED with progressive shortness of breath and weakness/fatigue.  Chest x-ray notable for right lower lobe atelectasis; no infiltrate. --COVID test: PCR + 03/19/2020 --CRP 3.2, <0.05, 3.0>2.8 --ddimer <0.27, <0.27, 0.59>0.31 --Remdesivir, plan 5-day course (Day #2/5 ) --Continue Solumedrol 40mg  IV q8h --prone for 2-3hrs every 12hrs if able --Continue supplemental oxygen, titrate to maintain SPO2 greater than 92%, currently on 2 L nasal cannula with SPO2 89% --Continue supportive care with albuterol/ipratropium MDI prn, vitamin C, zinc, Tylenol, antitussives (benzonatate/ Mucinex/Tussionex) --Follow CBC, CMP, D-dimer, ferritin, and CRP daily --Continue airborne/contact isolation precautions for 3 weeks from the day of diagnosis  The treatment plan and  use of medications and known side effects were discussed with patient/family. Some of the medications used are based on case reports/anecdotal data.  All other medications being used in the management of COVID-19 based on limited study data.  Complete risks and long-term side effects are unknown, however in the best clinical judgment they seem to be of some benefit.  Patient wanted to proceed with treatment options provided.  Profound generalized weakness Etiology likely secondary to acute Covid-19 viral infection, also likely confounded by history of partially treated/untreated multiple psychiatric disorders. --Continue NS @ 62mL/hr --Continue PT/OT efforts with planned home health on discharge  Hyponatremia Sodium 129 admission, likely secondary to hypovolemic hyponatremia. --Na [phone] --continue hydration with NS @ 47mL/hr --BMP daily  Hypoalbuminemia Albumin level low at 1.9, etiology likely secondary to poor oral intake.   --Nutrition following, appreciate assistance --Continue supplementation  multiple psychiatric disorders (anxiety, depression, bipolar disorder, OCD, PTSD) --Continue home hydroxyzine, propanolol 10mg  BID. --Patient states she was supposed to be on other medicines as well but she could not go for a follow-up visit because of weakness. --Outpatient follow-up with PCP/psychiatry  GERD -Continue PPI  DVT prophylaxis: Lovenox Code Status: Full code Family Communication: Updated patient extensively at bedside; no patient contacts in the EMR  Disposition Plan:  Status is: Inpatient  Remains inpatient appropriate because:Persistent severe electrolyte disturbances, Ongoing diagnostic testing needed not appropriate for outpatient work up, Unsafe d/c plan, IV treatments appropriate due to intensity of illness or inability to take PO and Inpatient level of care appropriate due to severity of illness   Dispo: The patient is from: Home              Anticipated  d/c is to: Home  Anticipated d/c date is: 3 days              Patient currently is not medically stable to d/c.    Consultants:   none  Procedures:   none  Antimicrobials:   Remdesivir 9/2>>   Subjective: Patient seen and examined bedside, resting comfortably.  Continues with profound weakness/fatigue and shortness of breath.  Now on 2 L nasal cannula.  Also with very poor oral intake.  Inflammatory markers now trending down.  No other complaints or concerns at this time.  Denies headache, no fever/chills/night sweats, no nausea cefonicid diarrhea, no chest pain, no palpitations, no abdominal pain.  No acute events overnight per nursing staff.  Objective: Vitals:   03/22/20 0324 03/22/20 1419 03/22/20 2022 03/23/20 0429  BP: 98/68 109/79 124/78 107/75  Pulse: 64 87 79 68  Resp: 18 16 (!) 21 19  Temp: 97.6 F (36.4 C) 98 F (36.7 C) 98 F (36.7 C) 97.9 F (36.6 C)  TempSrc: Oral Oral    SpO2: 100% 100% 100% 99%  Weight:      Height:        Intake/Output Summary (Last 24 hours) at 03/23/2020 1227 Last data filed at 03/23/2020 0433 Gross per 24 hour  Intake 729 ml  Output 500 ml  Net 229 ml   Filed Weights   03/19/20 1528  Weight: 58.1 kg    Examination:  General exam: Appears calm and comfortable  Respiratory system: Clear to auscultation. Respiratory effort normal.  On 2 L nasal cannula with SPO2 100%. Cardiovascular system: S1 & S2 heard, RRR. No JVD, murmurs, rubs, gallops or clicks. No pedal edema. Gastrointestinal system: Abdomen is nondistended, soft and nontender. No organomegaly or masses felt. Normal bowel sounds heard. Central nervous system: Alert and oriented. No focal neurological deficits. Extremities: Symmetric 5 x 5 power. Skin: No rashes, lesions or ulcers Psychiatry: Judgement and insight appear normal. Mood & affect appropriate.     Data Reviewed: I have personally reviewed following labs and imaging studies  CBC: Recent Labs   Lab 03/19/20 1201 03/19/20 1201 03/19/20 2041 03/20/20 0402 03/21/20 0339 03/22/20 0407 03/23/20 0402  WBC 8.0   < > 7.5 7.1 7.2 4.8 6.1  NEUTROABS 3.7  --   --  2.7 2.9 1.9 4.2  HGB 11.1*   < > 10.3* 8.9* 8.3* 8.6* 8.3*  HCT 35.1*   < > 32.5* 28.6* 26.6* 28.2* 27.0*  MCV 87.1   < > 86.9 87.5 89.3 90.1 90.9  PLT 267   < > 260 243 233 254 292   < > = values in this interval not displayed.   Basic Metabolic Panel: Recent Labs  Lab 03/19/20 1201 03/19/20 1201 03/19/20 2041 03/20/20 0402 03/21/20 0339 03/22/20 0407 03/23/20 0402  NA 129*  --   --  131* 132* 136 135  K 4.5  --   --  3.9 3.9 3.7 4.4  CL 98  --   --  101 104 107 107  CO2 25  --   --  24 22 25 22   GLUCOSE 93  --   --  92 88 86 160*  BUN 16  --   --  15 7 6 9   CREATININE 0.95   < > 0.80 0.66 0.56 0.47 0.47  CALCIUM 7.9*  --   --  7.4* 7.2* 7.4* 7.6*   < > = values in this interval not displayed.   GFR: Estimated Creatinine Clearance: 69.7 mL/min (by C-G  formula based on SCr of 0.47 mg/dL). Liver Function Tests: Recent Labs  Lab 03/19/20 1201 03/20/20 0402 03/21/20 0339 03/22/20 0407 03/23/20 0402  AST 56* 45* 45* 44* 46*  ALT 34 27 26 27 31   ALKPHOS 39 33* 30* 31* 41  BILITOT 0.3 0.1* 0.3 0.2* 0.3  PROT 10.3* 8.9* 8.2* 8.0 8.2*  ALBUMIN 2.2* 1.9* 1.9* 1.7* 1.8*   No results for input(s): LIPASE, AMYLASE in the last 168 hours. No results for input(s): AMMONIA in the last 168 hours. Coagulation Profile: No results for input(s): INR, PROTIME in the last 168 hours. Cardiac Enzymes: No results for input(s): CKTOTAL, CKMB, CKMBINDEX, TROPONINI in the last 168 hours. BNP (last 3 results) No results for input(s): PROBNP in the last 8760 hours. HbA1C: No results for input(s): HGBA1C in the last 72 hours. CBG: Recent Labs  Lab 03/20/20 0842 03/20/20 1132 03/21/20 1157  GLUCAP 89 96 104*   Lipid Profile: No results for input(s): CHOL, HDL, LDLCALC, TRIG, CHOLHDL, LDLDIRECT in the last 72  hours. Thyroid Function Tests: No results for input(s): TSH, T4TOTAL, FREET4, T3FREE, THYROIDAB in the last 72 hours. Anemia Panel: Recent Labs    03/22/20 0407 03/23/20 0402  FERRITIN 63 56   Sepsis Labs: Recent Labs  Lab 03/19/20 1201  PROCALCITON <0.10  LATICACIDVEN 1.2    Recent Results (from the past 240 hour(s))  Blood Culture (routine x 2)     Status: None (Preliminary result)   Collection Time: 03/19/20 12:00 PM   Specimen: BLOOD  Result Value Ref Range Status   Specimen Description   Final    BLOOD LEFT ANTECUBITAL Performed at [facility], [address] [place], [place] [address]    Special Requests   Final    BOTTLES DRAWN AEROBIC AND ANAEROBIC Blood Culture adequate volume Performed at [facility], [address] [place], [place] [address]    Culture   Final    NO GROWTH 4 DAYS Performed at [facility], [address], [place], [place] [address]    Report Status PENDING  Incomplete  SARS Coronavirus 2 by RT PCR (hospital order, performed in [facility] hospital lab) Nasopharyngeal Nasopharyngeal Swab     Status: Abnormal   Collection Time: 03/19/20 12:01 PM   Specimen: Nasopharyngeal Swab  Result Value Ref Range Status   SARS Coronavirus 2 POSITIVE (A) NEGATIVE Final    Comment: RESULT CALLED TO, READ BACK BY AND VERIFIED WITH: WILSON,G. @1414  03/19/20 BILLINGSLEY,L (NOTE) SARS-CoV-2 target nucleic acids are DETECTED  SARS-CoV-2 RNA is generally detectable in upper respiratory specimens  during the acute phase of infection.  Positive results are indicative  of the presence of the identified virus, but do not rule out bacterial infection or co-infection with other pathogens not detected by the test.  Clinical correlation with patient history and  other diagnostic information is necessary to determine patient infection status.  The expected result is negative.  Fact Sheet for Patients:    BoilerBrush.com.cy   Fact Sheet for Healthcare Providers:   https://pope.com/    This test is not yet approved or cleared by the [place] FDA and  has been authorized for detection and/or diagnosis of SARS-CoV-2 by FDA under an Emergency Use Authorization (EUA).  This EUA will remain in effect (meaning this  test can be used) for the duration of  the COVID-19 declaration under Section 564(b)(1) of the Act, 21 U.S.C. section 360-bbb-3(b)(1), unless the authorization is terminated or revoked  sooner.  Performed at [facility], [address], [place], [place] [address]          Radiology Studies: No results found.      Scheduled Meds: . albuterol  2 puff Inhalation Q6H  . vitamin C  500 mg Oral Daily  . enoxaparin (LOVENOX) injection  40 mg Subcutaneous Q24H  . feeding supplement (ENSURE ENLIVE)  237 mL Oral BID BM  . hydrOXYzine  25 mg Oral QHS  . ipratropium  2 puff Inhalation Q6H  . methylPREDNISolone (SOLU-MEDROL) injection  40 mg Intravenous Q8H  . multivitamin with minerals  1 tablet Oral Daily  . pantoprazole  40 mg Oral Daily  . pyridOXINE  100 mg Oral Daily  . zinc sulfate  220 mg Oral Daily   Continuous Infusions: . sodium chloride 75 mL/hr at 03/23/20 0514  . remdesivir 100 mg in NS 100 mL 100 mg (03/23/20 0840)     LOS: 3 days    Time spent: 36 minutes spent on chart review, discussion with nursing staff, consultants, updating family and interview/physical exam; more than 50% of that time was spent in counseling and/or coordination of care.    Alvira Philips [place], DO [facility] Available via Epic secure chat 7am-7pm After these hours, please refer to coverage provider listed on [email or web address] 03/23/2020, 12:27 PM

## 2020-03-24 LAB — COMPREHENSIVE METABOLIC PANEL
ALT: 31 U/L (ref 0–44)
AST: 33 U/L (ref 15–41)
Albumin: 1.7 g/dL — ABNORMAL LOW (ref 3.5–5.0)
Alkaline Phosphatase: 34 U/L — ABNORMAL LOW (ref 38–126)
Anion gap: 4 — ABNORMAL LOW (ref 5–15)
BUN: 16 mg/dL (ref 6–20)
CO2: 23 mmol/L (ref 22–32)
Calcium: 7.7 mg/dL — ABNORMAL LOW (ref 8.9–10.3)
Chloride: 110 mmol/L (ref 98–111)
Creatinine, Ser: 0.49 mg/dL (ref 0.44–1.00)
GFR calc Af Amer: >60 mL/min (ref >60)
GFR calc non Af Amer: >60 mL/min (ref >60)
Glucose, Bld: 137 mg/dL — ABNORMAL HIGH (ref 70–99)
Potassium: 4.4 mmol/L (ref 3.5–5.1)
Sodium: 137 mmol/L (ref 135–145)
Total Bilirubin: 0.1 mg/dL — ABNORMAL LOW (ref 0.3–1.2)
Total Protein: 8 g/dL (ref 6.5–8.1)

## 2020-03-24 LAB — FERRITIN: Ferritin: 56 ng/mL (ref 11–307)

## 2020-03-24 LAB — CBC WITH DIFFERENTIAL/PLATELET
Abs Immature Granulocytes: 0.07 10*3/uL (ref 0.00–0.07)
Basophils Absolute: 0 10*3/uL (ref 0.0–0.1)
Basophils Relative: 0 %
Eosinophils Absolute: 0 10*3/uL (ref 0.0–0.5)
Eosinophils Relative: 0 %
HCT: 26.2 % — ABNORMAL LOW (ref 36.0–46.0)
Hemoglobin: 8 g/dL — ABNORMAL LOW (ref 12.0–15.0)
Immature Granulocytes: 1 %
Lymphocytes Relative: 25 %
Lymphs Abs: 2.3 10*3/uL (ref 0.7–4.0)
MCH: 27.3 pg (ref 26.0–34.0)
MCHC: 30.5 g/dL (ref 30.0–36.0)
MCV: 89.4 fL (ref 80.0–100.0)
Monocytes Absolute: 0.7 10*3/uL (ref 0.1–1.0)
Monocytes Relative: 7 %
Neutro Abs: 6.2 10*3/uL (ref 1.7–7.7)
Neutrophils Relative %: 67 %
Platelets: 335 10*3/uL (ref 150–400)
RBC: 2.93 MIL/uL — ABNORMAL LOW (ref 3.87–5.11)
RDW: 15.7 % — ABNORMAL HIGH (ref 11.5–15.5)
WBC: 9.3 10*3/uL (ref 4.0–10.5)
nRBC: 0 % (ref 0.0–0.2)

## 2020-03-24 LAB — CULTURE, BLOOD (ROUTINE X 2)
Culture: NO GROWTH
Special Requests: ADEQUATE

## 2020-03-24 LAB — D-DIMER, QUANTITATIVE: D-Dimer, Quant: 0.27 ug/mL-FEU (ref 0.00–0.50)

## 2020-03-24 LAB — C-REACTIVE PROTEIN: CRP: <0.5 mg/dL (ref <1.0)

## 2020-03-24 MED ORDER — MENTHOL 3 MG MT LOZG: 1.0000 | LOZENGE | OROMUCOSAL | Status: DC | PRN

## 2020-03-24 MED ORDER — METHYLPREDNISOLONE SODIUM SUCC 40 MG IJ SOLR
40.0000 mg | Freq: Two times a day (BID) | INTRAMUSCULAR | Status: DC
  Administered 2020-03-24 – 2020-03-25 (×2): 40 mg via INTRAVENOUS
  Filled 2020-03-24 (×2): qty 1

## 2020-03-24 NOTE — Progress Notes (Signed)
PROGRESS NOTE    Sheri Hall  EXB:[id number] DOB: [birth year] DOA: 03/19/2020 PCP: Norm Salt, PA    Brief Narrative:  Sheri Hall is a 50 y.o. female with PMH of rheumatoid arthritis, seasonal asthma, GERD, multiple psychiatric disorders including anxiety, depression, bipolar disorder, OCD, PTSD who presented to the ED on 03/19/2020 with 1 week history of shortness of breath, productive cough, nausea, poor oral intake, generalized malaise and weakness.    She is not vaccinated against COVID-19.    In the ED, patient was afebrile, heart rate in 20s, blood pressure in 90s and low 100s. Initially she was maintaining oxygen saturation on room air but later she required 2 L by nasal cannula. Covid PCR positive. Chest x-ray unremarkable. Inflammatory markers were mildly elevated. Sodium level low at 129. TRH consulted for admission.    Assessment & Plan:   Principal Problem:   Dehydration Active Problems:   MDD (major depressive disorder), severe (HCC)   COVID-19 virus infection   Acute hypoxic respiratory failure secondary to acute Covid-19 viral pneumonia during the ongoing 2020 Covid 19 Pandemic - POA Patient presenting to ED with progressive shortness of breath and weakness/fatigue.  Chest x-ray notable for right lower lobe atelectasis; no infiltrate. --COVID test: PCR + 03/19/2020 --CRP 3.2, <0.05, 3.0>2.8> <0.5 --ddimer <0.27, <0.27, 0.59>0.31>0.27 --Remdesivir, plan 5-day course (Day #3/5 ) --Decrease Solumedrol to 40mg  IV q12h --prone for 2-3hrs every 12hrs if able --Continue supplemental oxygen, titrate to maintain SPO2 greater than 92%, currently on 2 L nasal cannula with SPO2 100% --Continue supportive care with albuterol/ipratropium MDI prn, vitamin C, zinc, Tylenol, antitussives (benzonatate/ Mucinex/Tussionex) --Follow CBC, CMP, D-dimer, ferritin, and CRP daily --Continue airborne/contact isolation precautions for 3 weeks from the day of diagnosis  The  treatment plan and use of medications and known side effects were discussed with patient/family. Some of the medications used are based on case reports/anecdotal data.  All other medications being used in the management of COVID-19 based on limited study data.  Complete risks and long-term side effects are unknown, however in the best clinical judgment they seem to be of some benefit.  Patient wanted to proceed with treatment options provided.  Profound generalized weakness Etiology likely secondary to acute Covid-19 viral infection, also likely confounded by history of partially treated/untreated multiple psychiatric disorders. --Discontinue IV fluids today --Continue PT/OT efforts with planned home health on discharge  Hyponatremia Sodium 129 admission, likely secondary to hypovolemic hyponatremia. --Na 129>132>135>137 --Discontinue IV fluids today --BMP daily  Hypoalbuminemia Albumin level low at 1.9, etiology likely secondary to poor oral intake.   --Nutrition following, appreciate assistance --Continue supplementation  multiple psychiatric disorders (anxiety, depression, bipolar disorder, OCD, PTSD) --Continue home hydroxyzine, propanolol 10mg  BID. --Patient states she was supposed to be on other medicines as well but she could not go for a follow-up visit because of weakness. --Outpatient follow-up with PCP/psychiatry  GERD -Continue PPI  DVT prophylaxis: Lovenox Code Status: Full code Family Communication: Updated patient extensively at bedside Disposition Plan:  Status is: Inpatient  Remains inpatient appropriate because:Persistent severe electrolyte disturbances, Ongoing diagnostic testing needed not appropriate for outpatient work up, Unsafe d/c plan, IV treatments appropriate due to intensity of illness or inability to take PO and Inpatient level of care appropriate due to severity of illness   Dispo: The patient is from: Home              Anticipated d/c is to:  Home  Anticipated d/c date is: 2 days              Patient currently is not medically stable to d/c.    Consultants:   none  Procedures:   none  Antimicrobials:   Remdesivir 9/2>>   Subjective: Patient seen and examined bedside, resting comfortably.  Weakness/fatigue improved.  Appetite improving.  Shortness of breath also improved but not back to her normal baseline.  Inflammatory markers continue to trend down.  On day 3/5 of remdesivir. No other complaints or concerns at this time.  Denies headache, no fever/chills/night sweats, no nausea/vomiting/diarrhea, no chest pain, no palpitations, no abdominal pain.  No acute events overnight per nursing staff.  Objective: Vitals:   03/23/20 0429 03/23/20 1400 03/23/20 1955 03/24/20 0526  BP: 107/75 114/79 118/77 128/74  Pulse: 68 75 65 (!) 49  Resp: 19 18 18 16   Temp: 97.9 F (36.6 C) 98.4 F (36.9 C) 98 F (36.7 C) 98.7 F (37.1 C)  TempSrc:  Oral Oral   SpO2: 99% 100% 100% 100%  Weight:      Height:        Intake/Output Summary (Last 24 hours) at 03/24/2020 1104 Last data filed at 03/24/2020 0600 Gross per 24 hour  Intake 5295.79 ml  Output --  Net 5295.79 ml   Filed Weights   03/19/20 1528  Weight: 58.1 kg    Examination:  General exam: Appears calm and comfortable  Respiratory system: Clear to auscultation. Respiratory effort normal.  On 2 L nasal cannula with SPO2 100%. Cardiovascular system: S1 & S2 heard, RRR. No JVD, murmurs, rubs, gallops or clicks. No pedal edema. Gastrointestinal system: Abdomen is nondistended, soft and nontender. No organomegaly or masses felt. Normal bowel sounds heard. Central nervous system: Alert and oriented. No focal neurological deficits. Extremities: Symmetric 5 x 5 power. Skin: No rashes, lesions or ulcers Psychiatry: Judgement and insight appear poor. Mood & affect appropriate.     Data Reviewed: I have personally reviewed following labs and imaging  studies  CBC: Recent Labs  Lab 03/20/20 0402 03/21/20 0339 03/22/20 0407 03/23/20 0402 03/24/20 0344  WBC 7.1 7.2 4.8 6.1 9.3  NEUTROABS 2.7 2.9 1.9 4.2 6.2  HGB 8.9* 8.3* 8.6* 8.3* 8.0*  HCT 28.6* 26.6* 28.2* 27.0* 26.2*  MCV 87.5 89.3 90.1 90.9 89.4  PLT 243 233 254 292 335   Basic Metabolic Panel: Recent Labs  Lab 03/20/20 0402 03/21/20 0339 03/22/20 0407 03/23/20 0402 03/24/20 0344  NA 131* 132* 136 135 137  K 3.9 3.9 3.7 4.4 4.4  CL 101 104 107 107 110  CO2 24 22 25 22 23   GLUCOSE 92 88 86 160* 137*  BUN 15 7 6 9 16   CREATININE 0.66 0.56 0.47 0.47 0.49  CALCIUM 7.4* 7.2* 7.4* 7.6* 7.7*   GFR: Estimated Creatinine Clearance: 69.7 mL/min (by C-G formula based on SCr of 0.49 mg/dL). Liver Function Tests: Recent Labs  Lab 03/20/20 0402 03/21/20 0339 03/22/20 0407 03/23/20 0402 03/24/20 0344  AST 45* 45* 44* 46* 33  ALT 27 26 27 31 31   ALKPHOS 33* 30* 31* 41 34*  BILITOT 0.1* 0.3 0.2* 0.3 0.1*  PROT 8.9* 8.2* 8.0 8.2* 8.0  ALBUMIN 1.9* 1.9* 1.7* 1.8* 1.7*   No results for input(s): LIPASE, AMYLASE in the last 168 hours. No results for input(s): AMMONIA in the last 168 hours. Coagulation Profile: No results for input(s): INR, PROTIME in the last 168 hours. Cardiac Enzymes: No results for input(s): CKTOTAL,  CKMB, CKMBINDEX, TROPONINI in the last 168 hours. BNP (last 3 results) No results for input(s): PROBNP in the last 8760 hours. HbA1C: No results for input(s): HGBA1C in the last 72 hours. CBG: Recent Labs  Lab 03/20/20 0842 03/20/20 1132 03/21/20 1157  GLUCAP 89 96 104*   Lipid Profile: No results for input(s): CHOL, HDL, LDLCALC, TRIG, CHOLHDL, LDLDIRECT in the last 72 hours. Thyroid Function Tests: No results for input(s): TSH, T4TOTAL, FREET4, T3FREE, THYROIDAB in the last 72 hours. Anemia Panel: Recent Labs    03/23/20 0402 03/24/20 0344  FERRITIN 56 56   Sepsis Labs: Recent Labs  Lab 03/19/20 1201  PROCALCITON <0.10   LATICACIDVEN 1.2    Recent Results (from the past 240 hour(s))  Blood Culture (routine x 2)     Status: None (Preliminary result)   Collection Time: 03/19/20 12:00 PM   Specimen: BLOOD  Result Value Ref Range Status   Specimen Description   Final    BLOOD LEFT ANTECUBITAL Performed at [facility], [address] [place], [place] [address]    Special Requests   Final    BOTTLES DRAWN AEROBIC AND ANAEROBIC Blood Culture adequate volume Performed at [facility], [address], [place], [place] [address]    Culture   Final    NO GROWTH 4 DAYS Performed at [facility], [address] [place], [place] [address]    Report Status PENDING  Incomplete  SARS Coronavirus 2 by RT PCR (hospital order, performed in [facility] hospital lab) Nasopharyngeal Nasopharyngeal Swab     Status: Abnormal   Collection Time: 03/19/20 12:01 PM   Specimen: Nasopharyngeal Swab  Result Value Ref Range Status   SARS Coronavirus 2 POSITIVE (A) NEGATIVE Final    Comment: RESULT CALLED TO, READ BACK BY AND VERIFIED WITH: WILSON,G. @1414  03/19/20 BILLINGSLEY,L (NOTE) SARS-CoV-2 target nucleic acids are DETECTED  SARS-CoV-2 RNA is generally detectable in upper respiratory specimens  during the acute phase of infection.  Positive results are indicative  of the presence of the identified virus, but do not rule out bacterial infection or co-infection with other pathogens not detected by the test.  Clinical correlation with patient history and  other diagnostic information is necessary to determine patient infection status.  The expected result is negative.  Fact Sheet for Patients:   BoilerBrush.com.cy   Fact Sheet for Healthcare Providers:   https://pope.com/    This test is not yet approved or cleared by the [place] FDA and  has been authorized for detection and/or diagnosis of SARS-CoV-2 by FDA under an Emergency  Use Authorization (EUA).  This EUA will remain in effect (meaning this  test can be used) for the duration of  the COVID-19 declaration under Section 564(b)(1) of the Act, 21 U.S.C. section 360-bbb-3(b)(1), unless the authorization is terminated or revoked sooner.  Performed at [facility], [address], [place], [place] [address]          Radiology Studies: No results found.      Scheduled Meds: . albuterol  2 puff Inhalation Q6H  . vitamin C  500 mg Oral Daily  . enoxaparin (LOVENOX) injection  40 mg Subcutaneous Q24H  . feeding supplement (ENSURE ENLIVE)  237 mL Oral BID BM  . hydrOXYzine  25 mg Oral QHS  . ipratropium  2 puff Inhalation Q6H  . methylPREDNISolone (SOLU-MEDROL) injection  40 mg Intravenous Q12H  . multivitamin with minerals  1 tablet Oral  Daily  . pantoprazole  40 mg Oral Daily  . pyridOXINE  100 mg Oral Daily  . zinc sulfate  220 mg Oral Daily   Continuous Infusions: . sodium chloride 75 mL/hr at 03/24/20 0531  . remdesivir 100 mg in NS 100 mL 100 mg (03/24/20 0931)     LOS: 4 days    Time spent: 36 minutes spent on chart review, discussion with nursing staff, consultants, updating family and interview/physical exam; more than 50% of that time was spent in counseling and/or coordination of care.    Alvira Philips [place], DO [facility] Available via Epic secure chat 7am-7pm After these hours, please refer to coverage provider listed on [email or web address] 03/24/2020, 11:04 AM

## 2020-03-24 NOTE — Progress Notes (Signed)
Physical Therapy Treatment Patient Details Name: Sheri Hall MRN: [id number] DOB: [birth year] Today's Date: 03/24/2020    History of Present Illness Sheri Hall is a 49 y.o. female with PMH of rheumatoid arthritis, seasonal asthma, GERD, multiple psychiatric disorders including anxiety, depression, bipolar disorder, OCD, PTSD.4 Admitted with COVID    PT Comments    Pt progressing well.  General Gait Details: self able to amb around her room and to and from bathroom as needed.  Good safety cognition and awareness of lines.  present with 2/4 dyspnea.  Used inhaler twice with good recovery with rest.  SATURATION QUALIFICATIONS: (This note is used to comply with regulatory documentation for home oxygen)  Patient Saturations on Room Air at Rest = 90%  Patient Saturations on Room Air while Ambulating 20 feet= 84%  Patient Saturations on 2 Liters of oxygen while Ambulating = 90%  Please briefly explain why patient needs home oxygen:  Pt required supplemental oxygen during activity    Follow Up Recommendations  Home health PT;Supervision/Assistance - 24 hour     Equipment Recommendations  Rolling walker with 5" wheels    Recommendations for Other Services       Precautions / Restrictions Precautions Precautions: Fall Precaution Comments: monitor sats    Mobility  Bed Mobility Overal bed mobility: Modified Independent             General bed mobility comments: increased time  Transfers Overall transfer level: Modified independent   Transfers: Sit to/from Stand;Stand Pivot Transfers Sit to Stand: Modified independent (Device/Increase time) Stand pivot transfers: Modified independent (Device/Increase time)       General transfer comment: good safety cognition and awareness of lines  Ambulation/Gait Ambulation/Gait assistance: Modified independent (Device/Increase time) Gait Distance (Feet): 40 Feet (20 feet x 2) Assistive device: None Gait  Pattern/deviations: Step-through pattern Gait velocity: decreased   General Gait Details: self able to amb around her room and to and from bathroom as needed.  Good safety cognition and awareness of lines.  present with 2/4 dyspnea.  Used inhaler twice with good recovery with rest.   Stairs             Wheelchair Mobility    Modified Rankin (Stroke Patients Only)       Balance                                            Cognition Arousal/Alertness: Awake/alert Behavior During Therapy: WFL for tasks assessed/performed Overall Cognitive Status: Within Functional Limits for tasks assessed                                 General Comments: AxO x 3      Exercises      General Comments        Pertinent Vitals/Pain Pain Assessment: No/denies pain    Home Living                      Prior Function            PT Goals (current goals can now be found in the care plan section) Progress towards PT goals: Progressing toward goals    Frequency    Min 3X/week      PT Plan Current plan remains appropriate    Co-evaluation  AM-PAC PT "6 Clicks" Mobility   Outcome Measure  Help needed turning from your back to your side while in a flat bed without using bedrails?: None Help needed moving from lying on your back to sitting on the side of a flat bed without using bedrails?: None Help needed moving to and from a bed to a chair (including a wheelchair)?: None Help needed standing up from a chair using your arms (e.g., wheelchair or bedside chair)?: None Help needed to walk in hospital room?: A Little Help needed climbing 3-5 steps with a railing? : A Little 6 Click Score: 22    End of Session Equipment Utilized During Treatment: Gait belt Activity Tolerance: Patient tolerated treatment well Patient left: in bed;with call bell/phone within reach;with bed alarm set Nurse Communication: Mobility status PT  Visit Diagnosis: Unsteadiness on feet (R26.81);Muscle weakness (generalized) (M62.81);Difficulty in walking, not elsewhere classified (R26.2)     Time: 4818-5631 PT Time Calculation (min) (ACUTE ONLY): 21 min  Charges:  $Gait Training: 8-22 mins                     Felecia Shelling  PTA Acute  Rehabilitation Services Pager      [phone] Office      [phone]

## 2020-03-25 LAB — BASIC METABOLIC PANEL
Anion gap: 5 (ref 5–15)
BUN: 17 mg/dL (ref 6–20)
CO2: 26 mmol/L (ref 22–32)
Calcium: 8.1 mg/dL — ABNORMAL LOW (ref 8.9–10.3)
Chloride: 109 mmol/L (ref 98–111)
Creatinine, Ser: 0.51 mg/dL (ref 0.44–1.00)
GFR calc Af Amer: >60 mL/min (ref >60)
GFR calc non Af Amer: >60 mL/min (ref >60)
Glucose, Bld: 100 mg/dL — ABNORMAL HIGH (ref 70–99)
Potassium: 4.3 mmol/L (ref 3.5–5.1)
Sodium: 140 mmol/L (ref 135–145)

## 2020-03-25 LAB — MAGNESIUM: Magnesium: 2 mg/dL (ref 1.7–2.4)

## 2020-03-25 LAB — D-DIMER, QUANTITATIVE: D-Dimer, Quant: <0.27 ug/mL-FEU (ref 0.00–0.50)

## 2020-03-25 LAB — C-REACTIVE PROTEIN: CRP: <0.5 mg/dL (ref <1.0)

## 2020-03-25 MED ORDER — METHYLPREDNISOLONE SODIUM SUCC 40 MG IJ SOLR
40.0000 mg | Freq: Every day | INTRAMUSCULAR | Status: DC
  Administered 2020-03-26: 40 mg via INTRAVENOUS
  Filled 2020-03-25: qty 1

## 2020-03-25 NOTE — Progress Notes (Signed)
PROGRESS NOTE    Sheri Hall  ZOX:[id number] DOB: 11-Sep-1970 DOA: 03/19/2020 PCP: Norm Salt, PA    Brief Narrative:  Sheri Hall is a 49 y.o. female with PMH of rheumatoid arthritis, seasonal asthma, GERD, multiple psychiatric disorders including anxiety, depression, bipolar disorder, OCD, PTSD who presented to the ED on 03/19/2020 with 1 week history of shortness of breath, productive cough, nausea, poor oral intake, generalized malaise and weakness.    She is not vaccinated against COVID-19.    In the ED, patient was afebrile, heart rate in 20s, blood pressure in 90s and low 100s. Initially she was maintaining oxygen saturation on room air but later she required 2 L by nasal cannula. Covid PCR positive. Chest x-ray unremarkable. Inflammatory markers were mildly elevated. Sodium level low at 129. TRH consulted for admission.    Assessment & Plan:   Principal Problem:   Dehydration Active Problems:   MDD (major depressive disorder), severe (HCC)   COVID-19 virus infection   Acute hypoxic respiratory failure secondary to acute Covid-19 viral pneumonia during the ongoing 2020 Covid 19 Pandemic - POA Patient presenting to ED with progressive shortness of breath and weakness/fatigue.  Chest x-ray notable for right lower lobe atelectasis; no infiltrate. --COVID test: PCR + 03/19/2020 --CRP 3.2, <0.05, 3.0>2.8> <0.5 --ddimer <0.27, <0.27, 0.59>0.31>0.27 --Remdesivir, plan 5-day course (Day #3/5 ) --Decrease Solumedrol to 40mg  IV q24h --prone for 2-3hrs every 12hrs if able --Continue supplemental oxygen, titrate to maintain SPO2 greater than 92%, currently on 2 L nasal cannula with SPO2 100%; desatted to 84% on room air while working with physical therapy yesterday --Continue supportive care with albuterol/ipratropium MDI prn, vitamin C, zinc, Tylenol, antitussives (benzonatate/ Mucinex/Tussionex) --Follow CBC, CMP, D-dimer, ferritin, and CRP daily --Continue  airborne/contact isolation precautions for 3 weeks from the day of diagnosis  The treatment plan and use of medications and known side effects were discussed with patient/family. Some of the medications used are based on case reports/anecdotal data.  All other medications being used in the management of COVID-19 based on limited study data.  Complete risks and long-term side effects are unknown, however in the best clinical judgment they seem to be of some benefit.  Patient wanted to proceed with treatment options provided.  Profound generalized weakness Etiology likely secondary to acute Covid-19 viral infection, also likely confounded by history of partially treated/untreated multiple psychiatric disorders. --Continue PT/OT efforts with planned home health on discharge  Hyponatremia Sodium 129 admission, likely secondary to hypovolemic hyponatremia. --Na 129>132>135>137>140 --Discontinue IV fluids 9/4 --BMP daily  Hypoalbuminemia Albumin level low at 1.9, etiology likely secondary to poor oral intake.   --Nutrition following, appreciate assistance --Continue supplementation  multiple psychiatric disorders (anxiety, depression, bipolar disorder, OCD, PTSD) --Continue home hydroxyzine, propanolol 10mg  BID. --Patient states she was supposed to be on other medicines as well but she could not go for a follow-up visit because of weakness. --Outpatient follow-up with PCP/psychiatry  GERD -Continue PPI  DVT prophylaxis: Lovenox Code Status: Full code Family Communication: Updated patient extensively at bedside Disposition Plan:  Status is: Inpatient  Remains inpatient appropriate because:Persistent severe electrolyte disturbances, Ongoing diagnostic testing needed not appropriate for outpatient work up, Unsafe d/c plan, IV treatments appropriate due to intensity of illness or inability to take PO and Inpatient level of care appropriate due to severity of illness   Dispo: The patient  is from: Home              Anticipated d/c is to: Home  Anticipated d/c date is: 1 day              Patient currently is not medically stable to d/c.    Consultants:   none  Procedures:   none  Antimicrobials:   Remdesivir 9/2>>   Subjective: Patient seen and examined bedside, resting comfortably.  Weakness/fatigue improved.  Appetite improving.  Shortness of breath also improved but not back to her normal baseline.  Inflammatory markers continue to trend down.  On day 3/5 of remdesivir. No other complaints or concerns at this time.  Denies headache, no fever/chills/night sweats, no nausea/vomiting/diarrhea, no chest pain, no palpitations, no abdominal pain.  No acute events overnight per nursing staff.  Objective: Vitals:   03/24/20 0526 03/24/20 1500 03/24/20 2044 03/25/20 0457  BP: 128/74 135/80 (!) 139/93 131/84  Pulse: (!) 49 61 63 (!) 47  Resp: Temp: 98.7 F (37.1 C) 98.1 F (36.7 C) 98.2 F (36.8 C) 97.7 F (36.5 C)  TempSrc:  Oral  Oral  SpO2: 100% (!) 88% 100% 97%  Weight:      Height:        Intake/Output Summary (Last 24 hours) at 03/25/2020 1043 Last data filed at 03/25/2020 0900 Gross per 24 hour  Intake 255 ml  Output --  Net 255 ml   Filed Weights   03/19/20 1528  Weight: 58.1 kg    Examination:  General exam: Appears calm and comfortable  Respiratory system: Clear to auscultation. Respiratory effort normal.  On 2 L nasal cannula with SPO2 100%, but desaturated to 84% on room air yesterday with PT. Cardiovascular system: S1 & S2 heard, RRR. No JVD, murmurs, rubs, gallops or clicks. No pedal edema. Gastrointestinal system: Abdomen is nondistended, soft and nontender. No organomegaly or masses felt. Normal bowel sounds heard. Central nervous system: Alert and oriented. No focal neurological deficits. Extremities: Symmetric 5 x 5 power. Skin: No rashes, lesions or ulcers Psychiatry: Judgement and insight appear poor. Mood &  affect appropriate.     Data Reviewed: I have personally reviewed following labs and imaging studies  CBC: Recent Labs  Lab 03/20/20 0402 03/21/20 0339 03/22/20 0407 03/23/20 0402 03/24/20 0344  WBC 7.1 7.2 4.8 6.1 9.3  NEUTROABS 2.7 2.9 1.9 4.2 6.2  HGB 8.9* 8.3* 8.6* 8.3* 8.0*  HCT 28.6* 26.6* 28.2* 27.0* 26.2*  MCV 87.5 89.3 90.1 90.9 89.4  PLT 243 233 254 292 335   Basic Metabolic Panel: Recent Labs  Lab 03/21/20 0339 03/22/20 0407 03/23/20 0402 03/24/20 0344 03/25/20 0431  NA 132* 136 135 137 140  K 3.9 3.7 4.4 4.4 4.3  CL 104 107 107 110 109  CO2 GLUCOSE 88 86 160* 137* 100*  BUN CREATININE 0.56 0.47 0.47 0.49 0.51  CALCIUM 7.2* 7.4* 7.6* 7.7* 8.1*  MG  --   --   --   --  2.0   GFR: Estimated Creatinine Clearance: 69.7 mL/min (by C-G formula based on SCr of 0.51 mg/dL). Liver Function Tests: Recent Labs  Lab 03/20/20 0402 03/21/20 0339 03/22/20 0407 03/23/20 0402 03/24/20 0344  AST 45* 45* 44* 46* 33  ALT ALKPHOS 33* 30* 31* 41 34*  BILITOT 0.1* 0.3 0.2* 0.3 0.1*  PROT 8.9* 8.2* 8.0 8.2* 8.0  ALBUMIN 1.9* 1.9* 1.7* 1.8* 1.7*   No results for input(s): LIPASE, AMYLASE in the last 168 hours. No results  for input(s): AMMONIA in the last 168 hours. Coagulation Profile: No results for input(s): INR, PROTIME in the last 168 hours. Cardiac Enzymes: No results for input(s): CKTOTAL, CKMB, CKMBINDEX, TROPONINI in the last 168 hours. BNP (last 3 results) No results for input(s): PROBNP in the last 8760 hours. HbA1C: No results for input(s): HGBA1C in the last 72 hours. CBG: Recent Labs  Lab 03/20/20 0842 03/20/20 1132 03/21/20 1157  GLUCAP 89 96 104*   Lipid Profile: No results for input(s): CHOL, HDL, LDLCALC, TRIG, CHOLHDL, LDLDIRECT in the last 72 hours. Thyroid Function Tests: No results for input(s): TSH, T4TOTAL, FREET4, T3FREE, THYROIDAB in the last 72 hours. Anemia Panel: Recent Labs     03/23/20 0402 03/24/20 0344  FERRITIN 56 56   Sepsis Labs: Recent Labs  Lab 03/19/20 1201  PROCALCITON <0.10  LATICACIDVEN 1.2    Recent Results (from the past 240 hour(s))  Blood Culture (routine x 2)     Status: None   Collection Time: 03/19/20 12:00 PM   Specimen: BLOOD  Result Value Ref Range Status   Specimen Description   Final    BLOOD LEFT ANTECUBITAL Performed at [facility], [address] [place], [place] [address]    Special Requests   Final    BOTTLES DRAWN AEROBIC AND ANAEROBIC Blood Culture adequate volume Performed at [facility], [address], [place], [place] [address]    Culture   Final    NO GROWTH 5 DAYS Performed at [facility], [address] [place], [place] [address]    Report Status 03/24/2020 FINAL  Final  SARS Coronavirus 2 by RT PCR (hospital order, performed in [facility] hospital lab) Nasopharyngeal Nasopharyngeal Swab     Status: Abnormal   Collection Time: 03/19/20 12:01 PM   Specimen: Nasopharyngeal Swab  Result Value Ref Range Status   SARS Coronavirus 2 POSITIVE (A) NEGATIVE Final    Comment: RESULT CALLED TO, READ BACK BY AND VERIFIED WITH: WILSON,G. @1414  03/19/20 BILLINGSLEY,L (NOTE) SARS-CoV-2 target nucleic acids are DETECTED  SARS-CoV-2 RNA is generally detectable in upper respiratory specimens  during the acute phase of infection.  Positive results are indicative  of the presence of the identified virus, but do not rule out bacterial infection or co-infection with other pathogens not detected by the test.  Clinical correlation with patient history and  other diagnostic information is necessary to determine patient infection status.  The expected result is negative.  Fact Sheet for Patients:   BoilerBrush.com.cy   Fact Sheet for Healthcare Providers:   https://pope.com/    This test is not yet approved or cleared by the [place] FDA  and  has been authorized for detection and/or diagnosis of SARS-CoV-2 by FDA under an Emergency Use Authorization (EUA).  This EUA will remain in effect (meaning this  test can be used) for the duration of  the COVID-19 declaration under Section 564(b)(1) of the Act, 21 U.S.C. section 360-bbb-3(b)(1), unless the authorization is terminated or revoked sooner.  Performed at [facility], [address], [place], [place] [address]          Radiology Studies: No results found.      Scheduled Meds: . albuterol  2 puff Inhalation Q6H  . vitamin C  500 mg Oral Daily  . enoxaparin (LOVENOX) injection  40 mg Subcutaneous Q24H  . feeding supplement (ENSURE ENLIVE)  237 mL Oral BID BM  . hydrOXYzine  25 mg Oral QHS  .  ipratropium  2 puff Inhalation Q6H  . methylPREDNISolone (SOLU-MEDROL) injection  40 mg Intravenous Q12H  . multivitamin with minerals  1 tablet Oral Daily  . pantoprazole  40 mg Oral Daily  . pyridOXINE  100 mg Oral Daily  . zinc sulfate  220 mg Oral Daily   Continuous Infusions: . remdesivir 100 mg in NS 100 mL 100 mg (03/25/20 1029)     LOS: 5 days    Time spent: 36 minutes spent on chart review, discussion with nursing staff, consultants, updating family and interview/physical exam; more than 50% of that time was spent in counseling and/or coordination of care.    Alvira Philips [place], DO [facility] Available via Epic secure chat 7am-7pm After these hours, please refer to coverage provider listed on [email or web address] 03/25/2020, 10:43 AM

## 2020-03-26 LAB — BASIC METABOLIC PANEL
Anion gap: 6 (ref 5–15)
BUN: 19 mg/dL (ref 6–20)
CO2: 26 mmol/L (ref 22–32)
Calcium: 7.7 mg/dL — ABNORMAL LOW (ref 8.9–10.3)
Chloride: 105 mmol/L (ref 98–111)
Creatinine, Ser: 0.62 mg/dL (ref 0.44–1.00)
GFR calc Af Amer: >60 mL/min (ref >60)
GFR calc non Af Amer: >60 mL/min (ref >60)
Glucose, Bld: 82 mg/dL (ref 70–99)
Potassium: 3.7 mmol/L (ref 3.5–5.1)
Sodium: 137 mmol/L (ref 135–145)

## 2020-03-26 LAB — MAGNESIUM: Magnesium: 1.6 mg/dL — ABNORMAL LOW (ref 1.7–2.4)

## 2020-03-26 LAB — D-DIMER, QUANTITATIVE: D-Dimer, Quant: <0.27 ug/mL-FEU (ref 0.00–0.50)

## 2020-03-26 LAB — C-REACTIVE PROTEIN: CRP: <0.5 mg/dL (ref <1.0)

## 2020-03-26 MED ORDER — ZINC SULFATE 220 (50 ZN) MG PO CAPS: 220.0000 mg | ORAL_CAPSULE | Freq: Every day | ORAL | 0 refills | Status: AC

## 2020-03-26 MED ORDER — ALBUTEROL SULFATE HFA 108 (90 BASE) MCG/ACT IN AERS: 2.0000 | INHALATION_SPRAY | Freq: Four times a day (QID) | RESPIRATORY_TRACT | 0 refills | Status: DC | PRN

## 2020-03-26 MED ORDER — GUAIFENESIN-DM 100-10 MG/5ML PO SYRP: 10.0000 mL | ORAL_SOLUTION | ORAL | 0 refills | Status: DC | PRN

## 2020-03-26 MED ORDER — PREDNISONE 10 MG PO TABS: ORAL_TABLET | ORAL | 0 refills | Status: AC

## 2020-03-26 MED ORDER — IPRATROPIUM BROMIDE HFA 17 MCG/ACT IN AERS: 2.0000 | INHALATION_SPRAY | Freq: Four times a day (QID) | RESPIRATORY_TRACT | 0 refills | Status: DC | PRN

## 2020-03-26 MED ORDER — ASCORBIC ACID 500 MG PO TABS: 500.0000 mg | ORAL_TABLET | Freq: Every day | ORAL | 0 refills | Status: AC

## 2020-03-26 NOTE — Discharge Instructions (Signed)
10 Things You Can Do to Manage Your COVID-19 Symptoms at Home If you have possible or confirmed COVID-19: 1. Stay home from work and school. And stay away from other public places. If you must go out, avoid using any kind of public transportation, ridesharing, or taxis. 2. Monitor your symptoms carefully. If your symptoms get worse, call your healthcare provider immediately. 3. Get rest and stay hydrated. 4. If you have a medical appointment, call the healthcare provider ahead of time and tell them that you have or may have COVID-19. 5. For medical emergencies, call 911 and notify the dispatch personnel that you have or may have COVID-19. 6. Cover your cough and sneezes with a tissue or use the inside of your elbow. 7. Wash your hands often with soap and water for at least 20 seconds or clean your hands with an alcohol-based hand sanitizer that contains at least 60% alcohol. 8. As much as possible, stay in a specific room and away from other people in your home. Also, you should use a separate bathroom, if available. If you need to be around other people in or outside of the home, wear a mask. 9. Avoid sharing personal items with other people in your household, like dishes, towels, and bedding. 10. Clean all surfaces that are touched often, like counters, tabletops, and doorknobs. Use household cleaning sprays or wipes according to the label instructions. cdc.gov/coronavirus 01/19/2019 This information is not intended to replace advice given to you by your health care provider. Make sure you discuss any questions you have with your health care provider. Document Revised: 06/23/2019 Document Reviewed: 06/23/2019 Elsevier Patient Education  2020 [employer]   COVID-19 COVID-19 is a respiratory infection that is caused by a virus called severe acute respiratory syndrome coronavirus 2 (SARS-CoV-2). The disease is also known as coronavirus disease or novel coronavirus. In some people, the virus may  not cause any symptoms. In others, it may cause a serious infection. The infection can get worse quickly and can lead to complications, such as:  Pneumonia, or infection of the lungs.  Acute respiratory distress syndrome or ARDS. This is a condition in which fluid build-up in the lungs prevents the lungs from filling with air and passing oxygen into the blood.  Acute respiratory failure. This is a condition in which there is not enough oxygen passing from the lungs to the body or when carbon dioxide is not passing from the lungs out of the body.  Sepsis or septic shock. This is a serious bodily reaction to an infection.  Blood clotting problems.  Secondary infections due to bacteria or fungus.  Organ failure. This is when your body's organs stop working. The virus that causes COVID-19 is contagious. This means that it can spread from person to person through droplets from coughs and sneezes (respiratory secretions). What are the causes? This illness is caused by a virus. You may catch the virus by:  Breathing in droplets from an infected person. Droplets can be spread by a person breathing, speaking, singing, coughing, or sneezing.  Touching something, like a table or a doorknob, that was exposed to the virus (contaminated) and then touching your mouth, nose, or eyes. What increases the risk? Risk for infection You are more likely to be infected with this virus if you:  Are within 6 feet (2 meters) of a person with COVID-19.  Provide care for or live with a person who is infected with COVID-19.  Spend time in crowded indoor spaces or   live in shared housing. Risk for serious illness You are more likely to become seriously ill from the virus if you:  Are 50 years of age or older. The higher your age, the more you are at risk for serious illness.  Live in a nursing home or long-term care facility.  Have cancer.  Have a long-term (chronic) disease such as: ? Chronic lung disease,  including chronic obstructive pulmonary disease or asthma. ? A long-term disease that lowers your body's ability to fight infection (immunocompromised). ? Heart disease, including heart failure, a condition in which the arteries that lead to the heart become narrow or blocked (coronary artery disease), a disease which makes the heart muscle thick, weak, or stiff (cardiomyopathy). ? Diabetes. ? Chronic kidney disease. ? Sickle cell disease, a condition in which red blood cells have an abnormal "sickle" shape. ? Liver disease.  Are obese. What are the signs or symptoms? Symptoms of this condition can range from mild to severe. Symptoms may appear any time from 2 to 14 days after being exposed to the virus. They include:  A fever or chills.  A cough.  Difficulty breathing.  Headaches, body aches, or muscle aches.  Runny or stuffy (congested) nose.  A sore throat.  New loss of taste or smell. Some people may also have stomach problems, such as nausea, vomiting, or diarrhea. Other people may not have any symptoms of COVID-19. How is this diagnosed? This condition may be diagnosed based on:  Your signs and symptoms, especially if: ? You live in an area with a COVID-19 outbreak. ? You recently traveled to or from an area where the virus is common. ? You provide care for or live with a person who was diagnosed with COVID-19. ? You were exposed to a person who was diagnosed with COVID-19.  A physical exam.  Lab tests, which may include: ? Taking a sample of fluid from the back of your nose and throat (nasopharyngeal fluid), your nose, or your throat using a swab. ? A sample of mucus from your lungs (sputum). ? Blood tests.  Imaging tests, which may include, X-rays, CT scan, or ultrasound. How is this treated? At present, there is no medicine to treat COVID-19. Medicines that treat other diseases are being used on a trial basis to see if they are effective against COVID-19. Your  health care provider will talk with you about ways to treat your symptoms. For most people, the infection is mild and can be managed at home with rest, fluids, and over-the-counter medicines. Treatment for a serious infection usually takes places in a hospital intensive care unit (ICU). It may include one or more of the following treatments. These treatments are given until your symptoms improve.  Receiving fluids and medicines through an IV.  Supplemental oxygen. Extra oxygen is given through a tube in the nose, a face mask, or a hood.  Positioning you to lie on your stomach (prone position). This makes it easier for oxygen to get into the lungs.  Continuous positive airway pressure (CPAP) or bi-level positive airway pressure (BPAP) machine. This treatment uses mild air pressure to keep the airways open. A tube that is connected to a motor delivers oxygen to the body.  Ventilator. This treatment moves air into and out of the lungs by using a tube that is placed in your windpipe.  Tracheostomy. This is a procedure to create a hole in the neck so that a breathing tube can be inserted.  Extracorporeal membrane   oxygenation (ECMO). This procedure gives the lungs a chance to recover by taking over the functions of the heart and lungs. It supplies oxygen to the body and removes carbon dioxide. Follow these instructions at home: Lifestyle  If you are sick, stay home except to get medical care. Your health care provider will tell you how long to stay home. Call your health care provider before you go for medical care.  Rest at home as told by your health care provider.  Do not use any products that contain nicotine or tobacco, such as cigarettes, e-cigarettes, and chewing tobacco. If you need help quitting, ask your health care provider.  Return to your normal activities as told by your health care provider. Ask your health care provider what activities are safe for you. General  instructions  Take over-the-counter and prescription medicines only as told by your health care provider.  Drink enough fluid to keep your urine pale yellow.  Keep all follow-up visits as told by your health care provider. This is important. How is this prevented?  There is no vaccine to help prevent COVID-19 infection. However, there are steps you can take to protect yourself and others from this virus. To protect yourself:   Do not travel to areas where COVID-19 is a risk. The areas where COVID-19 is reported change often. To identify high-risk areas and travel restrictions, check the CDC travel website: wwwnc.cdc.gov/travel/notices  If you live in, or must travel to, an area where COVID-19 is a risk, take precautions to avoid infection. ? Stay away from people who are sick. ? Wash your hands often with soap and water for 20 seconds. If soap and water are not available, use an alcohol-based hand sanitizer. ? Avoid touching your mouth, face, eyes, or nose. ? Avoid going out in public, follow guidance from your state and local health authorities. ? If you must go out in public, wear a cloth face covering or face mask. Make sure your mask covers your nose and mouth. ? Avoid crowded indoor spaces. Stay at least 6 feet (2 meters) away from others. ? Disinfect objects and surfaces that are frequently touched every day. This may include:  Counters and tables.  Doorknobs and light switches.  Sinks and faucets.  Electronics, such as phones, remote controls, keyboards, computers, and tablets. To protect others: If you have symptoms of COVID-19, take steps to prevent the virus from spreading to others.  If you think you have a COVID-19 infection, contact your health care provider right away. Tell your health care team that you think you may have a COVID-19 infection.  Stay home. Leave your house only to seek medical care. Do not use public transport.  Do not travel while you are  sick.  Wash your hands often with soap and water for 20 seconds. If soap and water are not available, use alcohol-based hand sanitizer.  Stay away from other members of your household. Let healthy household members care for children and pets, if possible. If you have to care for children or pets, wash your hands often and wear a mask. If possible, stay in your own room, separate from others. Use a different bathroom.  Make sure that all people in your household wash their hands well and often.  Cough or sneeze into a tissue or your sleeve or elbow. Do not cough or sneeze into your hand or into the air.  Wear a cloth face covering or face mask. Make sure your mask covers your nose   and mouth. Where to find more information  Centers for Disease Control and Prevention: PurpleGadgets.be  World Health Organization: https://www.castaneda.info/ Contact a health care provider if:  You live in or have traveled to an area where COVID-19 is a risk and you have symptoms of the infection.  You have had contact with someone who has COVID-19 and you have symptoms of the infection. Get help right away if:  You have trouble breathing.  You have pain or pressure in your chest.  You have confusion.  You have bluish lips and fingernails.  You have difficulty waking from sleep.  You have symptoms that get worse. These symptoms may represent a serious problem that is an emergency. Do not wait to see if the symptoms will go away. Get medical help right away. Call your local emergency services (911 in the [place] Do not drive yourself to the hospital. Let the emergency medical personnel know if you think you have COVID-19. Summary  COVID-19 is a respiratory infection that is caused by a virus. It is also known as coronavirus disease or novel coronavirus. It can cause serious infections, such as pneumonia, acute respiratory distress syndrome, acute respiratory failure,  or sepsis.  The virus that causes COVID-19 is contagious. This means that it can spread from person to person through droplets from breathing, speaking, singing, coughing, or sneezing.  You are more likely to develop a serious illness if you are 56 years of age or older, have a weak immune system, live in a nursing home, or have chronic disease.  There is no medicine to treat COVID-19. Your health care provider will talk with you about ways to treat your symptoms.  Take steps to protect yourself and others from infection. Wash your hands often and disinfect objects and surfaces that are frequently touched every day. Stay away from people who are sick and wear a mask if you are sick. This information is not intended to replace advice given to you by your health care provider. Make sure you discuss any questions you have with your health care provider. Document Revised: 05/06/2019 Document Reviewed: 08/12/2018 Elsevier Patient Education  2020 [place].  COVID-19: How to Protect Yourself and Others Know how it spreads  There is currently no vaccine to prevent coronavirus disease 2019 (COVID-19).  The best way to prevent illness is to avoid being exposed to this virus.  The virus is thought to spread mainly from person-to-person. ? Between people who are in close contact with one another (within about 6 feet). ? Through respiratory droplets produced when an infected person coughs, sneezes or talks. ? These droplets can land in the mouths or noses of people who are nearby or possibly be inhaled into the lungs. ? COVID-19 may be spread by people who are not showing symptoms. Everyone should Clean your hands often  Wash your hands often with soap and water for at least 20 seconds especially after you have been in a public place, or after blowing your nose, coughing, or sneezing.  If soap and water are not readily available, use a hand sanitizer that contains at least 60% alcohol. Cover  all surfaces of your hands and rub them together until they feel dry.  Avoid touching your eyes, nose, and mouth with unwashed hands. Avoid close contact  Limit contact with others as much as possible.  Avoid close contact with people who are sick.  Put distance between yourself and other people. ? Remember that some people without symptoms may be  able to spread virus. ? This is especially important for people who are at higher risk of getting very [employer].it Cover your mouth and nose with a mask when around others  You could spread COVID-19 to others even if you do not feel sick.  Everyone should wear a mask in public settings and when around people not living in their household, especially when social distancing is difficult to maintain. ? Masks should not be placed on young children under age 62, anyone who has trouble breathing, or is unconscious, incapacitated or otherwise unable to remove the mask without assistance.  The mask is meant to protect other people in case you are infected.  Do NOT use a facemask meant for a Research scientist (physical sciences).  Continue to keep about 6 feet between yourself and others. The mask is not a substitute for social distancing. Cover coughs and sneezes  Always cover your mouth and nose with a tissue when you cough or sneeze or use the inside of your elbow.  Throw used tissues in the trash.  Immediately wash your hands with soap and water for at least 20 seconds. If soap and water are not readily available, clean your hands with a hand sanitizer that contains at least 60% alcohol. Clean and disinfect  Clean AND disinfect frequently touched surfaces daily. This includes tables, doorknobs, light switches, countertops, handles, desks, phones, keyboards, toilets, faucets, and sinks. ktimeonline.com  If surfaces are  dirty, clean them: Use detergent or soap and water prior to disinfection.  Then, use a household disinfectant. You can see a list of EPA-registered household disinfectants here. [email or web address].co.[email or web address] 03/23/2019 This information is not intended to replace advice given to you by your health care provider. Make sure you discuss any questions you have with your health care provider. Document Revised: 03/31/2019 Document Reviewed: 01/27/2019 Elsevier Patient Education  2020 Elsevier Inc.  COVID-19: Quarantine vs. Isolation QUARANTINE keeps someone who was in close contact with someone who has COVID-19 away from others. If you had close contact with a person who has COVID-19  Stay home until 14 days after your last contact.  Check your temperature twice a day and watch for symptoms of COVID-19.  If possible, stay away from people who are at higher-risk for getting very sick from COVID-19. ISOLATION keeps someone who is sick or tested positive for COVID-19 without symptoms away from others, even in their own home. If you are sick and think or know you have COVID-19  Stay home until after ? At least 10 days since symptoms first appeared and ? At least 24 hours with no fever without fever-reducing medication and ? Symptoms have improved If you tested positive for COVID-19 but do not have symptoms  Stay home until after ? 10 days have passed since your positive test If you live with others, stay in a specific "sick room" or area and away from other people or animals, including pets. Use a separate bathroom, if available. [email or web address].[email or web address] 02/07/2019 This information is not intended to replace advice given to you by your health care provider. Make sure you discuss any questions you have with your health care provider. Document Revised: 06/23/2019 Document Reviewed: 06/23/2019 Elsevier Patient Education  2020 Elsevier Inc.    Person Under Monitoring Name: Sheri Hall  Location: [address] [place] [address]   Infection Prevention Recommendations for Individuals Confirmed to have, or Being Evaluated for, 2019 Novel Coronavirus (COVID-19) Infection Who Receive Care at Home  Individuals who are confirmed to have, or  are being evaluated for, COVID-19 should follow the prevention steps below until a healthcare provider or local or state health department says they can return to normal activities.  Stay home except to get medical care You should restrict activities outside your home, except for getting medical care. Do not go to work, school, or public areas, and do not use public transportation or taxis.  Call ahead before visiting your doctor Before your medical appointment, call the healthcare provider and tell them that you have, or are being evaluated for, COVID-19 infection. This will help the healthcare provider's office take steps to keep other people from getting infected. Ask your healthcare provider to call the local or state health department.  Monitor your symptoms Seek prompt medical attention if your illness is worsening (e.g., difficulty breathing). Before going to your medical appointment, call the healthcare provider and tell them that you have, or are being evaluated for, COVID-19 infection. Ask your healthcare provider to call the local or state health department.  Wear a facemask You should wear a facemask that covers your nose and mouth when you are in the same room with other people and when you visit a healthcare provider. People who live with or visit you should also wear a facemask while they are in the same room with you.  Separate yourself from other people in your home As much as possible, you should stay in a different room from other people in your home. Also, you should use a separate bathroom, if available.  Avoid sharing household items You should not share dishes, drinking glasses, cups, eating utensils, towels, bedding,  or other items with other people in your home. After using these items, you should wash them thoroughly with soap and water.  Cover your coughs and sneezes Cover your mouth and nose with a tissue when you cough or sneeze, or you can cough or sneeze into your sleeve. Throw used tissues in a lined trash can, and immediately wash your hands with soap and water for at least 20 seconds or use an alcohol-based hand rub.  Wash your [employer] your hands often and thoroughly with soap and water for at least 20 seconds. You can use an alcohol-based hand sanitizer if soap and water are not available and if your hands are not visibly dirty. Avoid touching your eyes, nose, and mouth with unwashed hands.   Prevention Steps for Caregivers and Household Members of Individuals Confirmed to have, or Being Evaluated for, COVID-19 Infection Being Cared for in the Home  If you live with, or provide care at home for, a person confirmed to have, or being evaluated for, COVID-19 infection please follow these guidelines to prevent infection:  Follow healthcare provider's instructions Make sure that you understand and can help the patient follow any healthcare provider instructions for all care.  Provide for the patient's basic needs You should help the patient with basic needs in the home and provide support for getting groceries, prescriptions, and other personal needs.  Monitor the patient's symptoms If they are getting sicker, call his or her medical provider and tell them that the patient has, or is being evaluated for, COVID-19 infection. This will help the healthcare provider's office take steps to keep other people from getting infected. Ask the healthcare provider to call the local or state health department.  Limit the number of people who have contact with the patient  If possible, have only one caregiver for the patient.  Other household members should stay  in another home or place of  residence. If this is not possible, they should stay  in another room, or be separated from the patient as much as possible. Use a separate bathroom, if available.  Restrict visitors who do not have an essential need to be in the home.  Keep older adults, very young children, and other sick people away from the patient Keep older adults, very young children, and those who have compromised immune systems or chronic health conditions away from the patient. This includes people with chronic heart, lung, or kidney conditions, diabetes, and cancer.  Ensure good ventilation Make sure that shared spaces in the home have good air flow, such as from an air conditioner or an opened window, weather permitting.  Wash your hands often  Wash your hands often and thoroughly with soap and water for at least 20 seconds. You can use an alcohol based hand sanitizer if soap and water are not available and if your hands are not visibly dirty.  Avoid touching your eyes, nose, and mouth with unwashed hands.  Use disposable paper towels to dry your hands. If not available, use dedicated cloth towels and replace them when they become wet.  Wear a facemask and gloves  Wear a disposable facemask at all times in the room and gloves when you touch or have contact with the patient's blood, body fluids, and/or secretions or excretions, such as sweat, saliva, sputum, nasal mucus, vomit, urine, or feces.  Ensure the mask fits over your nose and mouth tightly, and do not touch it during use.  Throw out disposable facemasks and gloves after using them. Do not reuse.  Wash your hands immediately after removing your facemask and gloves.  If your personal clothing becomes contaminated, carefully remove clothing and launder. Wash your hands after handling contaminated clothing.  Place all used disposable facemasks, gloves, and other waste in a lined container before disposing them with other household waste.  Remove  gloves and wash your hands immediately after handling these items.  Do not share dishes, glasses, or other household items with the patient  Avoid sharing household items. You should not share dishes, drinking glasses, cups, eating utensils, towels, bedding, or other items with a patient who is confirmed to have, or being evaluated for, COVID-19 infection.  After the person uses these items, you should wash them thoroughly with soap and water.  Wash laundry thoroughly  Immediately remove and wash clothes or bedding that have blood, body fluids, and/or secretions or excretions, such as sweat, saliva, sputum, nasal mucus, vomit, urine, or feces, on them.  Wear gloves when handling laundry from the patient.  Read and follow directions on labels of laundry or clothing items and detergent. In general, wash and dry with the warmest temperatures recommended on the label.  Clean all areas the individual has used often  Clean all touchable surfaces, such as counters, tabletops, doorknobs, bathroom fixtures, toilets, phones, keyboards, tablets, and bedside tables, every day. Also, clean any surfaces that may have blood, body fluids, and/or secretions or excretions on them.  Wear gloves when cleaning surfaces the patient has come in contact with.  Use a diluted bleach solution (e.g., dilute bleach with 1 part bleach and 10 parts water) or a household disinfectant with a label that says EPA-registered for coronaviruses. To make a bleach solution at home, add 1 tablespoon of bleach to 1 quart (4 cups) of water. For a larger supply, add  cup of bleach to 1 gallon (16  cups) of water.  Read labels of cleaning products and follow recommendations provided on product labels. Labels contain instructions for safe and effective use of the cleaning product including precautions you should take when applying the product, such as wearing gloves or eye protection and making sure you have good ventilation during use  of the product.  Remove gloves and wash hands immediately after cleaning.  Monitor yourself for signs and symptoms of illness Caregivers and household members are considered close contacts, should monitor their health, and will be asked to limit movement outside of the home to the extent possible. Follow the monitoring steps for close contacts listed on the symptom monitoring form.   ? If you have additional questions, contact your local health department or call the epidemiologist on call at [phone] (available 24/7). ? This guidance is subject to change. For the most up-to-date guidance from [facility], please refer to their website: [email or web address].[email or web address]

## 2020-03-26 NOTE — Progress Notes (Signed)
Pt is being discharged home today. Discharge instructions including medications and follow up appts given. Pt had no further questions at this time. 

## 2020-03-26 NOTE — Discharge Summary (Signed)
Physician Discharge Summary  Sheri Hall HER:[id number] DOB: Jun 06, 1971 DOA: 03/19/2020  PCP: Norm Salt, PA  Admit date: 03/19/2020 Discharge date: 03/26/2020  Admitted From: Home Disposition: Home  Recommendations for Outpatient Follow-up:  1. Follow up with PCP in 1-2 weeks 2. Continue present on taper for Covid-19 viral pneumonia 3. Continue supportive treatments with Robitussin, vitamin C, zinc, albuterol/ipratropium MDI as needed 4. Continue home isolation for 14 days following initial diagnosis 5. Recommend Covid-19 vaccination following home isolation period.  Home Health: PT/OT Equipment/Devices: Oxygen, 2 L per nasal cannula, walker, tub bench  Discharge Condition: Stable CODE STATUS: Full code Diet recommendation: Regular diet  History of present illness:  Sheri Hall is a 49 y.o.femalewith PMH of rheumatoid arthritis, seasonal asthma, GERD, multiple psychiatric disorders including anxiety, depression, bipolar disorder, OCD, PTSD who presented to the ED on8/30/2021with 1 week history of shortness of breath, productive cough, nausea, poor oral intake, generalized malaise and weakness.   She is not vaccinated against COVID-19.   In the ED, patient was afebrile, heart rate in 20s, blood pressure in 90s and low 100s. Initially she was maintaining oxygen saturation on room air but later she required 2 L by nasal cannula. Covid PCR positive. Chest x-ray unremarkable. Inflammatory markers were mildly elevated. Sodium level low at 129. TRH consulted for admission.   Hospital course:  Acute hypoxic respiratory failure secondary to acute Covid-19 viral pneumonia during the ongoing 2020 Covid 19 Pandemic - POA Patient presenting to ED with progressive shortness of breath and weakness/fatigue.  Chest x-ray notable for right lower lobe atelectasis; no infiltrate. COVID test PCR + 03/19/2020.  Patient was noted to be hypoxic and was started on remdesivir and IV  steroids.  Patient steroids were deescalated during hospital course but still remained on 2 L nasal cannula with continued hypoxia on room air to 84% with ambulation.  Pleated 5-day course of remdesivir and will discharge home on prednisone taper.  Continue supportive care with albuterol/ipratropium MDI, zinc, vitamin C, Robitussin.  We will also discharge home with home health PT/OT, tub bench, walker.  Recommend follow-up with PCP in 1 week.  Recommend receive Covid-19 vaccination following 14-day isolation period.  The treatment plan and use of medications and known side effects were discussed with patient/family. Some of the medications used are based on case reports/anecdotal data.  All other medications being used in the management of COVID-19 based on limited study data.  Complete risks and long-term side effects are unknown, however in the best clinical judgment they seem to be of some benefit.  Patient wanted to proceed with treatment options provided.  Profound generalized weakness Etiology likely secondary to acute Covid-19 viral infection, also likely confounded by history of partially treated/untreated multiple psychiatric disorders. Continue PT/OT with home health on discharge.  Hyponatremia: Resolved. Sodium 129 admission, likely secondary to hypovolemic hyponatremia.  Was started on IV fluid hydration with improvement/resolution of hyponatremia with sodium of 140 at time of discharge.  Hypoalbuminemia Albumin level low at 1.9, etiology likely secondary to poor oral intake.Encourage increased oral intake.  multiple psychiatric disorders(anxiety, depression, bipolar disorder, OCD, PTSD) Continue home hydroxyzine, propanolol 10mg  BID.  Outpatient follow-up with PCP/psychiatry.   Discharge Diagnoses:  Principal Problem:   Dehydration Active Problems:   MDD (major depressive disorder), severe (HCC)   COVID-19 virus infection    Discharge Instructions  Discharge  Instructions    Call MD for:  difficulty breathing, headache or visual disturbances   Complete by: As directed  Call MD for:  extreme fatigue   Complete by: As directed    Call MD for:  persistant dizziness or light-headedness   Complete by: As directed    Call MD for:  persistant nausea and vomiting   Complete by: As directed    Call MD for:  severe uncontrolled pain   Complete by: As directed    Call MD for:  temperature >100.4   Complete by: As directed    Diet - low sodium heart healthy   Complete by: As directed    Increase activity slowly   Complete by: As directed      Allergies as of 03/26/2020   No Known Allergies     Medication List    STOP taking these medications   ARIPiprazole 15 MG tablet Commonly known as: ABILIFY   pantoprazole 40 MG tablet Commonly known as: PROTONIX   traZODone 50 MG tablet Commonly known as: DESYREL   venlafaxine XR 75 MG 24 hr capsule Commonly known as: EFFEXOR-XR     TAKE these medications   albuterol 108 (90 Base) MCG/ACT inhaler Commonly known as: VENTOLIN HFA Inhale 2 puffs into the lungs every 6 (six) hours as needed for wheezing or shortness of breath.   ascorbic acid 500 MG tablet Commonly known as: VITAMIN C Take 1 tablet (500 mg total) by mouth daily.   cyclobenzaprine 10 MG tablet Commonly known as: FLEXERIL Take 1 tablet (10 mg total) by mouth 2 (two) times daily as needed for muscle spasms.   guaiFENesin-dextromethorphan 100-10 MG/5ML syrup Commonly known as: ROBITUSSIN DM Take 10 mLs by mouth every 4 (four) hours as needed for cough.   hydrOXYzine 25 MG capsule Commonly known as: VISTARIL Take 25 mg by mouth at bedtime.   ibuprofen 600 MG tablet Commonly known as: ADVIL Take 1 tablet (600 mg total) by mouth every 6 (six) hours as needed.   ipratropium 17 MCG/ACT inhaler Commonly known as: ATROVENT HFA Inhale 2 puffs into the lungs every 6 (six) hours as needed for wheezing.   ondansetron 8 MG  tablet Commonly known as: ZOFRAN Take 8 mg by mouth 3 (three) times daily as needed.   predniSONE 10 MG tablet Commonly known as: DELTASONE Take 4 tablets (40 mg total) by mouth daily for 3 days, THEN 3 tablets (30 mg total) daily for 3 days, THEN 2 tablets (20 mg total) daily for 3 days, THEN 1 tablet (10 mg total) daily for 3 days. Start taking on: March 26, 2020   propranolol 10 MG tablet Commonly known as: INDERAL Take 10 mg by mouth 2 (two) times daily as needed (high blood pressure).   pyridOXINE 100 MG tablet Commonly known as: VITAMIN B-6 Take 100 mg by mouth daily.   VITAMIN B50 COMPLEX PO Take 1 tablet by mouth daily.   zinc sulfate 220 (50 Zn) MG capsule Take 1 capsule (220 mg total) by mouth daily.            Durable Medical Equipment  (From admission, onward)         Start     Ordered   03/25/20 0711  For home use only DME oxygen  Once       Question Answer Comment  Length of Need 6 Months   Mode or (Route) Nasal cannula   Liters per Minute 2   Frequency Continuous (stationary and portable oxygen unit needed)   Oxygen conserving device Yes   Oxygen delivery system Gas  03/25/20 0710   03/21/20 1343  For home use only DME Tub bench  Once        03/21/20 1342   03/21/20 1343  For home use only DME Walker  Once       Question:  Patient needs a walker to treat with the following condition  Answer:  Gait disturbance   03/21/20 1342          Follow-up Information    Norm Salt, PA. Schedule an appointment as soon as possible for a visit in 1 week(s).   Specialty: Physician Assistant Contact information: [address] [place] [address] [phone]              No Known Allergies  Consultations:  None   Procedures/Studies: DG Chest Port 1 View  Result Date: 03/19/2020 CLINICAL DATA:  Cough EXAM: PORTABLE CHEST 1 VIEW COMPARISON:  07/15/2018 FINDINGS: Cardiac and mediastinal contours normal. Vascularity normal.  Negative for edema or effusion. Mild streaky density in the right lung base likely atelectasis. Previously noted right upper lobe infiltrate has resolved. IMPRESSION: Mild right lower lobe atelectasis.  Negative for pneumonia Electronically Signed   By: Marlan Palau M.D.   On: 03/19/2020 12:21      Subjective: Patient seen and examined bedside, resting comfortably.  Eating breakfast.  Ready for discharge home.  Continues to be hypoxic with exertion with SPO2 dropping to 84% but maintains good oxygenation with 2 L nasal cannula.  Completed 5-day course of remdesivir.  No other complaints or concerns at this time.  Denies headache, no dizziness, no chest pain, palpitations, no abdominal pain, no fever/chills/night sweats, no nausea/vomiting/diarrhea.  No acute events overnight per nursing staff.  Discharge Exam: Vitals:   03/25/20 2019 03/26/20 0502  BP: (!) 141/81 125/80  Pulse: 72 (!) 50  Resp: (!) 21 16  Temp: 98.1 F (36.7 C) 97.6 F (36.4 C)  SpO2: 96% 100%   Vitals:   03/24/20 2044 03/25/20 0457 03/25/20 2019 03/26/20 0502  BP: (!) 139/93 131/84 (!) 141/81 125/80  Pulse: 63 (!) 47 72 (!) 50  Resp: 20 20 (!) 21 16  Temp: 98.2 F (36.8 C) 97.7 F (36.5 C) 98.1 F (36.7 C) 97.6 F (36.4 C)  TempSrc:  Oral    SpO2: 100% 97% 96% 100%  Weight:      Height:        General: Pt is alert, awake, not in acute distress Cardiovascular: RRR, S1/S2 +, no rubs, no gallops Respiratory: CTA bilaterally, no wheezing, no rhonchi, on 2 L nasal cannula with SPO2 98% Abdominal: Soft, NT, ND, bowel sounds + Extremities: no edema, no cyanosis    The results of significant diagnostics from this hospitalization (including imaging, microbiology, ancillary and laboratory) are listed below for reference.     Microbiology: Recent Results (from the past 240 hour(s))  Blood Culture (routine x 2)     Status: None   Collection Time: 03/19/20 12:00 PM   Specimen: BLOOD  Result Value Ref Range  Status   Specimen Description   Final    BLOOD LEFT ANTECUBITAL Performed at [facility], [address] [place], [place] [address]    Special Requests   Final    BOTTLES DRAWN AEROBIC AND ANAEROBIC Blood Culture adequate volume Performed at [facility], [address], [place], [place] [address]    Culture   Final    NO GROWTH 5 DAYS Performed at [facility],  [address] [place], [place] [address]    Report Status 03/24/2020 FINAL  Final  SARS Coronavirus 2 by RT PCR (hospital order, performed in [facility] hospital lab) Nasopharyngeal Nasopharyngeal Swab     Status: Abnormal   Collection Time: 03/19/20 12:01 PM   Specimen: Nasopharyngeal Swab  Result Value Ref Range Status   SARS Coronavirus 2 POSITIVE (A) NEGATIVE Final    Comment: RESULT CALLED TO, READ BACK BY AND VERIFIED WITH: WILSON,G. @1414  03/19/20 BILLINGSLEY,L (NOTE) SARS-CoV-2 target nucleic acids are DETECTED  SARS-CoV-2 RNA is generally detectable in upper respiratory specimens  during the acute phase of infection.  Positive results are indicative  of the presence of the identified virus, but do not rule out bacterial infection or co-infection with other pathogens not detected by the test.  Clinical correlation with patient history and  other diagnostic information is necessary to determine patient infection status.  The expected result is negative.  Fact Sheet for Patients:   BoilerBrush.com.cy   Fact Sheet for Healthcare Providers:   https://pope.com/    This test is not yet approved or cleared by the [place] FDA and  has been authorized for detection and/or diagnosis of SARS-CoV-2 by FDA under an Emergency Use Authorization (EUA).  This EUA will remain in effect (meaning this  test can be used) for the duration of  the COVID-19 declaration under Section 564(b)(1) of the Act, 21 U.S.C. section 360-bbb-3(b)(1), unless  the authorization is terminated or revoked sooner.  Performed at [facility], [address] [place], [place] [address]      Labs: BNP (last 3 results) No results for input(s): BNP in the last 8760 hours. Basic Metabolic Panel: Recent Labs  Lab 03/22/20 0407 03/23/20 0402 03/24/20 0344 03/25/20 0431 03/26/20 0437  NA 136 135 137 140 137  K 3.7 4.4 4.4 4.3 3.7  CL 107 107 110 109 105  CO2 25 22 23 26 26   GLUCOSE 86 160* 137* 100* 82  BUN 6 9 16 17 19   CREATININE 0.47 0.47 0.49 0.51 0.62  CALCIUM 7.4* 7.6* 7.7* 8.1* 7.7*  MG  --   --   --  2.0 1.6*   Liver Function Tests: Recent Labs  Lab 03/20/20 0402 03/21/20 0339 03/22/20 0407 03/23/20 0402 03/24/20 0344  AST 45* 45* 44* 46* 33  ALT 27 26 27 31 31   ALKPHOS 33* 30* 31* 41 34*  BILITOT 0.1* 0.3 0.2* 0.3 0.1*  PROT 8.9* 8.2* 8.0 8.2* 8.0  ALBUMIN 1.9* 1.9* 1.7* 1.8* 1.7*   No results for input(s): LIPASE, AMYLASE in the last 168 hours. No results for input(s): AMMONIA in the last 168 hours. CBC: Recent Labs  Lab 03/20/20 0402 03/21/20 0339 03/22/20 0407 03/23/20 0402 03/24/20 0344  WBC 7.1 7.2 4.8 6.1 9.3  NEUTROABS 2.7 2.9 1.9 4.2 6.2  HGB 8.9* 8.3* 8.6* 8.3* 8.0*  HCT 28.6* 26.6* 28.2* 27.0* 26.2*  MCV 87.5 89.3 90.1 90.9 89.4  PLT 243 233 254 292 335   Cardiac Enzymes: No results for input(s): CKTOTAL, CKMB, CKMBINDEX, TROPONINI in the last 168 hours. BNP: Invalid input(s): POCBNP CBG: Recent Labs  Lab 03/20/20 0842 03/20/20 1132 03/21/20 1157  GLUCAP 89 96 104*   D-Dimer Recent Labs    03/25/20 0431 03/26/20 0437  DDIMER <0.27 <0.27   Hgb A1c No results for input(s): HGBA1C in the last 72 hours. Lipid Profile No results for input(s): CHOL, HDL, LDLCALC, TRIG, CHOLHDL, LDLDIRECT in the last 72  hours. Thyroid function studies No results for input(s): TSH, T4TOTAL, T3FREE, THYROIDAB in the last 72 hours.  Invalid input(s): FREET3 Anemia work up Recent Labs     03/24/20 0344  FERRITIN 56   Urinalysis    Component Value Date/Time   COLORURINE YELLOW 03/20/2020 2158   APPEARANCEUR CLEAR 03/20/2020 2158   LABSPEC 1.010 03/20/2020 2158   PHURINE 6.0 03/20/2020 2158   GLUCOSEU NEGATIVE 03/20/2020 2158   HGBUR NEGATIVE 03/20/2020 2158   BILIRUBINUR NEGATIVE 03/20/2020 2158   KETONESUR NEGATIVE 03/20/2020 2158   PROTEINUR NEGATIVE 03/20/2020 2158   NITRITE NEGATIVE 03/20/2020 2158   LEUKOCYTESUR NEGATIVE 03/20/2020 2158   Sepsis Labs Invalid input(s): PROCALCITONIN,  WBC,  LACTICIDVEN Microbiology Recent Results (from the past 240 hour(s))  Blood Culture (routine x 2)     Status: None   Collection Time: 03/19/20 12:00 PM   Specimen: BLOOD  Result Value Ref Range Status   Specimen Description   Final    BLOOD LEFT ANTECUBITAL Performed at [facility], [address], [place], [place] [address]    Special Requests   Final    BOTTLES DRAWN AEROBIC AND ANAEROBIC Blood Culture adequate volume Performed at [facility], [address] [place], [place] [address]    Culture   Final    NO GROWTH 5 DAYS Performed at [facility], [address], [place], [place] [address]    Report Status 03/24/2020 FINAL  Final  SARS Coronavirus 2 by RT PCR (hospital order, performed in [facility] hospital lab) Nasopharyngeal Nasopharyngeal Swab     Status: Abnormal   Collection Time: 03/19/20 12:01 PM   Specimen: Nasopharyngeal Swab  Result Value Ref Range Status   SARS Coronavirus 2 POSITIVE (A) NEGATIVE Final    Comment: RESULT CALLED TO, READ BACK BY AND VERIFIED WITH: WILSON,G. @1414  03/19/20 BILLINGSLEY,L (NOTE) SARS-CoV-2 target nucleic acids are DETECTED  SARS-CoV-2 RNA is generally detectable in upper respiratory specimens  during the acute phase of infection.  Positive results are indicative  of the presence of the identified virus, but do not rule out bacterial infection or co-infection with other pathogens  not detected by the test.  Clinical correlation with patient history and  other diagnostic information is necessary to determine patient infection status.  The expected result is negative.  Fact Sheet for Patients:   BoilerBrush.com.cy   Fact Sheet for Healthcare Providers:   https://pope.com/    This test is not yet approved or cleared by the [place] FDA and  has been authorized for detection and/or diagnosis of SARS-CoV-2 by FDA under an Emergency Use Authorization (EUA).  This EUA will remain in effect (meaning this  test can be used) for the duration of  the COVID-19 declaration under Section 564(b)(1) of the Act, 21 U.S.C. section 360-bbb-3(b)(1), unless the authorization is terminated or revoked sooner.  Performed at [facility], [address] [place], [place] [address]      Time coordinating discharge: Over 30 minutes  SIGNED:   Alvira Philips [place], DO  [facility] 03/26/2020, 9:36 AM

## 2020-03-26 NOTE — TOC Transition Note (Signed)
Transition of Care [facility]) - CM/SW Discharge Note   Patient Details  Name: Sheri Hall MRN: [id number] Date of Birth: [birth year]  [facility] CM/SW Contact:  Amada Jupiter, LCSW Phone Number: 03/26/2020, 10:52 AM   Clinical Narrative:    Alerted by MD that pt is cleared for dc today.  Pt agreeable.  Reviewed DME and HH orders and pt agreeable to referrals (see below).  Tub bench and walker left outside of pt's room.  Oxygen is being delivered today.  No further TOC needs.   Final next level of care: Home w Home Health Services Barriers to Discharge: Barriers Resolved   Patient Goals and CMS Choice Patient states their goals for this hospitalization and ongoing recovery are:: go home CMS Medicare.gov Compare Post Acute Care list provided to:: Patient Choice offered to / list presented to : Patient  Discharge Placement                       Discharge Plan and Services                DME Arranged: Walker rolling, Tub bench, Oxygen DME Agency: [facility] Date DME Agency Contacted: 03/26/20 Time DME Agency Contacted: 1051 Representative spoke with at DME Agency: Zack HH Arranged: PT, OT HH Agency: [facility] Date HH Agency Contacted: 03/26/20 Time HH Agency Contacted: 1051 Representative spoke with at [facility] Agency: Melissa Plemmons  Social Determinants of Health (SDOH) Interventions     Readmission Risk Interventions No flowsheet data found.

## 2021-04-08 ENCOUNTER — Encounter (HOSPITAL_COMMUNITY): Payer: Self-pay | Admitting: Emergency Medicine

## 2021-04-08 ENCOUNTER — Other Ambulatory Visit: Payer: Self-pay

## 2021-04-08 ENCOUNTER — Emergency Department (HOSPITAL_COMMUNITY): Payer: Medicaid Other

## 2021-04-08 ENCOUNTER — Encounter (HOSPITAL_COMMUNITY): Payer: Self-pay

## 2021-04-08 ENCOUNTER — Inpatient Hospital Stay (HOSPITAL_COMMUNITY)
Admission: EM | Admit: 2021-04-08 | Discharge: 2021-04-11 | DRG: 871 | Discharge status: Against Medical Advice | Payer: Medicaid Other | Attending: Internal Medicine | Admitting: Internal Medicine

## 2021-04-08 ENCOUNTER — Ambulatory Visit (HOSPITAL_COMMUNITY)
Admission: EM | Admit: 2021-04-08 | Discharge: 2021-04-08 | Disposition: A | Discharge status: Home / Self Care | Payer: Medicaid Other

## 2021-04-08 DIAGNOSIS — J181 Lobar pneumonia, unspecified organism: Secondary | ICD-10-CM | POA: Diagnosis not present

## 2021-04-08 DIAGNOSIS — G9341 Metabolic encephalopathy: Secondary | ICD-10-CM

## 2021-04-08 DIAGNOSIS — M1990 Unspecified osteoarthritis, unspecified site: Secondary | ICD-10-CM | POA: Diagnosis present

## 2021-04-08 DIAGNOSIS — J189 Pneumonia, unspecified organism: Secondary | ICD-10-CM | POA: Diagnosis not present

## 2021-04-08 DIAGNOSIS — E871 Hypo-osmolality and hyponatremia: Secondary | ICD-10-CM | POA: Diagnosis not present

## 2021-04-08 DIAGNOSIS — M069 Rheumatoid arthritis, unspecified: Secondary | ICD-10-CM | POA: Diagnosis present

## 2021-04-08 DIAGNOSIS — J159 Unspecified bacterial pneumonia: Secondary | ICD-10-CM | POA: Diagnosis present

## 2021-04-08 DIAGNOSIS — R531 Weakness: Secondary | ICD-10-CM | POA: Diagnosis present

## 2021-04-08 DIAGNOSIS — F319 Bipolar disorder, unspecified: Secondary | ICD-10-CM | POA: Diagnosis present

## 2021-04-08 DIAGNOSIS — M06041 Rheumatoid arthritis without rheumatoid factor, right hand: Secondary | ICD-10-CM | POA: Diagnosis not present

## 2021-04-08 DIAGNOSIS — R7401 Elevation of levels of liver transaminase levels: Secondary | ICD-10-CM | POA: Diagnosis present

## 2021-04-08 DIAGNOSIS — D638 Anemia in other chronic diseases classified elsewhere: Secondary | ICD-10-CM | POA: Diagnosis present

## 2021-04-08 DIAGNOSIS — F05 Delirium due to known physiological condition: Secondary | ICD-10-CM | POA: Diagnosis not present

## 2021-04-08 DIAGNOSIS — Z5329 Procedure and treatment not carried out because of patient's decision for other reasons: Secondary | ICD-10-CM | POA: Diagnosis present

## 2021-04-08 DIAGNOSIS — Z7951 Long term (current) use of inhaled steroids: Secondary | ICD-10-CM

## 2021-04-08 DIAGNOSIS — J45909 Unspecified asthma, uncomplicated: Secondary | ICD-10-CM | POA: Diagnosis present

## 2021-04-08 DIAGNOSIS — R6520 Severe sepsis without septic shock: Secondary | ICD-10-CM | POA: Diagnosis present

## 2021-04-08 DIAGNOSIS — F4310 Post-traumatic stress disorder, unspecified: Secondary | ICD-10-CM | POA: Diagnosis present

## 2021-04-08 DIAGNOSIS — A419 Sepsis, unspecified organism: Secondary | ICD-10-CM | POA: Diagnosis present

## 2021-04-08 DIAGNOSIS — R040 Epistaxis: Secondary | ICD-10-CM | POA: Diagnosis present

## 2021-04-08 DIAGNOSIS — Z79899 Other long term (current) drug therapy: Secondary | ICD-10-CM | POA: Diagnosis not present

## 2021-04-08 DIAGNOSIS — Z8619 Personal history of other infectious and parasitic diseases: Secondary | ICD-10-CM | POA: Diagnosis not present

## 2021-04-08 DIAGNOSIS — E860 Dehydration: Secondary | ICD-10-CM | POA: Diagnosis present

## 2021-04-08 DIAGNOSIS — N179 Acute kidney failure, unspecified: Secondary | ICD-10-CM

## 2021-04-08 DIAGNOSIS — E876 Hypokalemia: Secondary | ICD-10-CM | POA: Diagnosis present

## 2021-04-08 DIAGNOSIS — F17210 Nicotine dependence, cigarettes, uncomplicated: Secondary | ICD-10-CM | POA: Diagnosis present

## 2021-04-08 DIAGNOSIS — D649 Anemia, unspecified: Secondary | ICD-10-CM | POA: Diagnosis not present

## 2021-04-08 DIAGNOSIS — R509 Fever, unspecified: Secondary | ICD-10-CM

## 2021-04-08 DIAGNOSIS — J0100 Acute maxillary sinusitis, unspecified: Secondary | ICD-10-CM | POA: Diagnosis not present

## 2021-04-08 DIAGNOSIS — R630 Anorexia: Secondary | ICD-10-CM | POA: Diagnosis not present

## 2021-04-08 DIAGNOSIS — Z20822 Contact with and (suspected) exposure to covid-19: Secondary | ICD-10-CM | POA: Diagnosis present

## 2021-04-08 DIAGNOSIS — F419 Anxiety disorder, unspecified: Secondary | ICD-10-CM | POA: Diagnosis present

## 2021-04-08 DIAGNOSIS — M06042 Rheumatoid arthritis without rheumatoid factor, left hand: Secondary | ICD-10-CM | POA: Diagnosis not present

## 2021-04-08 DIAGNOSIS — A4189 Other specified sepsis: Secondary | ICD-10-CM | POA: Diagnosis present

## 2021-04-08 DIAGNOSIS — F429 Obsessive-compulsive disorder, unspecified: Secondary | ICD-10-CM | POA: Diagnosis present

## 2021-04-08 DIAGNOSIS — K219 Gastro-esophageal reflux disease without esophagitis: Secondary | ICD-10-CM | POA: Diagnosis present

## 2021-04-08 DIAGNOSIS — Z8616 Personal history of COVID-19: Secondary | ICD-10-CM | POA: Diagnosis not present

## 2021-04-08 LAB — COMPREHENSIVE METABOLIC PANEL
ALT: 261 U/L — ABNORMAL HIGH (ref 0–44)
AST: 218 U/L — ABNORMAL HIGH (ref 15–41)
Albumin: 2.2 g/dL — ABNORMAL LOW (ref 3.5–5.0)
Alkaline Phosphatase: 57 U/L (ref 38–126)
Anion gap: 11 (ref 5–15)
BUN: 21 mg/dL — ABNORMAL HIGH (ref 6–20)
CO2: 23 mmol/L (ref 22–32)
Calcium: 7.9 mg/dL — ABNORMAL LOW (ref 8.9–10.3)
Chloride: 91 mmol/L — ABNORMAL LOW (ref 98–111)
Creatinine, Ser: 1.02 mg/dL — ABNORMAL HIGH (ref 0.44–1.00)
GFR, Estimated: >60 mL/min (ref >60)
Glucose, Bld: 114 mg/dL — ABNORMAL HIGH (ref 70–99)
Potassium: 3.3 mmol/L — ABNORMAL LOW (ref 3.5–5.1)
Sodium: 125 mmol/L — ABNORMAL LOW (ref 135–145)
Total Bilirubin: 1.1 mg/dL (ref 0.3–1.2)
Total Protein: 8.5 g/dL — ABNORMAL HIGH (ref 6.5–8.1)

## 2021-04-08 LAB — RESP PANEL BY RT-PCR (FLU A&B, COVID) ARPGX2
Influenza A by PCR: NEGATIVE
Influenza B by PCR: NEGATIVE
SARS Coronavirus 2 by RT PCR: NEGATIVE

## 2021-04-08 LAB — CBC WITH DIFFERENTIAL/PLATELET
Abs Immature Granulocytes: 0 10*3/uL (ref 0.00–0.07)
Basophils Absolute: 0 10*3/uL (ref 0.0–0.1)
Basophils Relative: 0 %
Blasts: 1 %
Eosinophils Absolute: 0 10*3/uL (ref 0.0–0.5)
Eosinophils Relative: 0 %
HCT: 30.2 % — ABNORMAL LOW (ref 36.0–46.0)
Hemoglobin: 10.2 g/dL — ABNORMAL LOW (ref 12.0–15.0)
Lymphocytes Relative: 25 %
Lymphs Abs: 4.3 10*3/uL — ABNORMAL HIGH (ref 0.7–4.0)
MCH: 28.3 pg (ref 26.0–34.0)
MCHC: 33.8 g/dL (ref 30.0–36.0)
MCV: 83.9 fL (ref 80.0–100.0)
Monocytes Absolute: 0.7 10*3/uL (ref 0.1–1.0)
Monocytes Relative: 4 %
Neutro Abs: 11.9 10*3/uL — ABNORMAL HIGH (ref 1.7–7.7)
Neutrophils Relative %: 70 %
Platelets: 174 10*3/uL (ref 150–400)
RBC: 3.6 MIL/uL — ABNORMAL LOW (ref 3.87–5.11)
RDW: 16.6 % — ABNORMAL HIGH (ref 11.5–15.5)
WBC: 17 10*3/uL — ABNORMAL HIGH (ref 4.0–10.5)
nRBC: 0 % (ref 0.0–0.2)

## 2021-04-08 LAB — I-STAT CHEM 8, ED
BUN: 21 mg/dL — ABNORMAL HIGH (ref 6–20)
Calcium, Ion: 0.99 mmol/L — ABNORMAL LOW (ref 1.15–1.40)
Chloride: 93 mmol/L — ABNORMAL LOW (ref 98–111)
Creatinine, Ser: 0.9 mg/dL (ref 0.44–1.00)
Glucose, Bld: 118 mg/dL — ABNORMAL HIGH (ref 70–99)
HCT: 32 % — ABNORMAL LOW (ref 36.0–46.0)
Hemoglobin: 10.9 g/dL — ABNORMAL LOW (ref 12.0–15.0)
Potassium: 3.4 mmol/L — ABNORMAL LOW (ref 3.5–5.1)
Sodium: 128 mmol/L — ABNORMAL LOW (ref 135–145)
TCO2: 27 mmol/L (ref 22–32)

## 2021-04-08 LAB — I-STAT BETA HCG BLOOD, ED (MC, WL, AP ONLY): I-stat hCG, quantitative: 5.7 m[IU]/mL — ABNORMAL HIGH (ref <5)

## 2021-04-08 LAB — PROTIME-INR
INR: 1.2 (ref 0.8–1.2)
Prothrombin Time: 15.4 seconds — ABNORMAL HIGH (ref 11.4–15.2)

## 2021-04-08 LAB — LIPASE, BLOOD: Lipase: 61 U/L — ABNORMAL HIGH (ref 11–51)

## 2021-04-08 LAB — PROCALCITONIN: Procalcitonin: 12.14 ng/mL

## 2021-04-08 LAB — CBG MONITORING, ED: Glucose-Capillary: 118 mg/dL — ABNORMAL HIGH (ref 70–99)

## 2021-04-08 LAB — AMMONIA: Ammonia: 34 umol/L (ref 9–35)

## 2021-04-08 LAB — APTT: aPTT: 36 seconds (ref 24–36)

## 2021-04-08 LAB — LACTIC ACID, PLASMA
Lactic Acid, Venous: 1.1 mmol/L (ref 0.5–1.9)
Lactic Acid, Venous: 1.9 mmol/L (ref 0.5–1.9)

## 2021-04-08 LAB — ETHANOL: Alcohol, Ethyl (B): <10 mg/dL (ref <10)

## 2021-04-08 MED ORDER — SODIUM CHLORIDE 0.9 % IV SOLN
500.0000 mg | INTRAVENOUS | Status: DC
  Administered 2021-04-08 – 2021-04-09 (×2): 500 mg via INTRAVENOUS
  Filled 2021-04-08 (×4): qty 500

## 2021-04-08 MED ORDER — SODIUM CHLORIDE 0.9 % IV SOLN
2.0000 g | INTRAVENOUS | Status: DC
  Administered 2021-04-08 – 2021-04-09 (×2): 2 g via INTRAVENOUS
  Filled 2021-04-08 (×3): qty 20

## 2021-04-08 MED ORDER — LACTATED RINGERS IV BOLUS (SEPSIS)
1000.0000 mL | Freq: Once | INTRAVENOUS | Status: AC
  Administered 2021-04-08: 1000 mL via INTRAVENOUS

## 2021-04-08 MED ORDER — LACTATED RINGERS IV SOLN: INTRAVENOUS | Status: DC

## 2021-04-08 MED ORDER — ONDANSETRON HCL 4 MG/2ML IJ SOLN
4.0000 mg | Freq: Once | INTRAMUSCULAR | Status: AC
  Administered 2021-04-08: 4 mg via INTRAVENOUS
  Filled 2021-04-08: qty 2

## 2021-04-08 NOTE — ED Provider Notes (Addendum)
[facility]    CSN: [id number] Arrival date & time: 04/08/21  1600      History   Chief Complaint Chief Complaint  Patient presents with   Dizziness   Altered Mental Status    HPI Sheri Hall is a 50 y.o. female presenting with altered mental status and dizziness for about 3 days, getting worse.  Medical history seasonal allergies, asthma, pneumonia, sepsis, rheumatoid arthritis, depression, bipolar.  COVID-19 02/2020.  Here today with son to help provide history given patient's altered mental status.  She endorses 3 days of constant dizziness, worse with ambulating.  Shortness of breath and coughing.  Denies fever/chills, though these are evident on triage today.  Denies chest pain, headaches, vision changes, weakness, urinary symptoms.  HPI  Past Medical History:  Diagnosis Date   Anxiety    Arthritis    rheumatoid    Asthma    seasonal   Bipolar disorder (HCC)    Depression    GERD (gastroesophageal reflux disease)    OCD (obsessive compulsive disorder)    PTSD (post-traumatic stress disorder)     Patient Active Problem List   Diagnosis Date Noted   Dehydration 03/19/2020   COVID-19 virus infection 03/19/2020   Bipolar I disorder, most recent episode mixed (HCC) 12/29/2019   Major depressive disorder, recurrent episode (HCC) 12/28/2019   Lobar pneumonia (HCC) 07/17/2018   Normocytic anemia 07/17/2018   Cocaine use 07/17/2018   Rheumatoid arthritis (HCC) 07/17/2018   Sepsis (HCC) 07/16/2018   PTSD (post-traumatic stress disorder) 09/17/2017   Substance use disorder 09/17/2017   Grief 09/17/2017   MDD (major depressive disorder), severe (HCC) 09/16/2017    Past Surgical History:  Procedure Laterality Date   CESAREAN SECTION     OPEN REDUCTION INTERNAL FIXATION (ORIF) DISTAL RADIAL FRACTURE Left 10/15/2015   Procedure: OPEN TREATMENT OF LEFT DISTAL RADIUS FRACTURE;  Surgeon: Mack Hook, MD;  Location: [facility];  Service:  Orthopedics;  Laterality: Left;    OB History   No obstetric history on file.      Home Medications    Prior to Admission medications   Medication Sig Start Date End Date Taking? Authorizing Provider  albuterol (VENTOLIN HFA) 108 (90 Base) MCG/ACT inhaler Inhale 2 puffs into the lungs every 6 (six) hours as needed for wheezing or shortness of breath. 03/26/20   [place], Eric J, DO  B Complex-Biotin-FA (VITAMIN B50 COMPLEX PO) Take 1 tablet by mouth daily.    [provider]  cyclobenzaprine (FLEXERIL) 10 MG tablet Take 1 tablet (10 mg total) by mouth 2 (two) times daily as needed for muscle spasms. 03/05/20   Moshe Cipro, NP  guaiFENesin-dextromethorphan (ROBITUSSIN DM) 100-10 MG/5ML syrup Take 10 mLs by mouth every 4 (four) hours as needed for cough. 03/26/20   [place], Alvira Philips, DO  hydrOXYzine (VISTARIL) 25 MG capsule Take 25 mg by mouth at bedtime. 03/13/20   [provider]  ibuprofen (ADVIL) 600 MG tablet Take 1 tablet (600 mg total) by mouth every 6 (six) hours as needed. 03/05/20   Moshe Cipro, NP  ipratropium (ATROVENT HFA) 17 MCG/ACT inhaler Inhale 2 puffs into the lungs every 6 (six) hours as needed for wheezing. 03/26/20   [place], Eric J, DO  ondansetron (ZOFRAN) 8 MG tablet Take 8 mg by mouth 3 (three) times daily as needed. 03/13/20   [provider]  propranolol (INDERAL) 10 MG tablet Take 10 mg by mouth 2 (two) times daily as needed (high blood  pressure).  03/13/20   [provider]  pyridOXINE (VITAMIN B-6) 100 MG tablet Take 100 mg by mouth daily.    [provider]    Family History Family History  Problem Relation Age of Onset   Healthy Mother    Healthy Father     Social History Social History   Tobacco Use   Smoking status: Every Day    Packs/day: 0.50    Types: Cigarettes   Smokeless tobacco: Never  Vaping Use   Vaping Use: Never used  Substance Use Topics   Alcohol use: Yes    Comment: occ; liquor    Drug use: Yes    Types: Cocaine, "Crack" cocaine    Comment: wednesday March 22nd 2017     Allergies   Patient has no known allergies.   Review of Systems Review of Systems  Constitutional:  Negative for appetite change, chills and fever.  HENT:  Negative for congestion, ear pain, rhinorrhea, sinus pressure, sinus pain and sore throat.   Eyes:  Negative for redness and visual disturbance.  Respiratory:  Positive for cough and shortness of breath. Negative for chest tightness and wheezing.   Cardiovascular:  Negative for chest pain and palpitations.  Gastrointestinal:  Negative for abdominal pain, constipation, diarrhea, nausea and vomiting.  Genitourinary:  Negative for dysuria, frequency and urgency.  Musculoskeletal:  Negative for myalgias.  Neurological:  Negative for dizziness, weakness and headaches.  Psychiatric/Behavioral:  Negative for confusion.   All other systems reviewed and are negative.   Physical Exam Triage Vital Signs ED Triage Vitals [04/08/21 1605]  Enc Vitals Group     BP 118/73     Pulse Rate (!) 132     Resp 20     Temp (!) 102.8 F (39.3 C)     Temp Source Oral     SpO2 94 %     Weight      Height      Head Circumference      Peak Flow      Pain Score 0     Pain Loc      Pain Edu?      Excl. in GC?    No data found.  Updated Vital Signs BP 118/73 (BP Location: Right Arm)   Pulse (!) 132   Temp (!) 102.8 F (39.3 C) (Oral)   Resp 20   SpO2 94%   Visual Acuity Right Eye Distance:   Left Eye Distance:   Bilateral Distance:    Right Eye Near:   Left Eye Near:    Bilateral Near:     Physical Exam Vitals reviewed.  Constitutional:      General: She is not in acute distress.    Appearance: Normal appearance. She is not ill-appearing.  HENT:     Head: Normocephalic and atraumatic.     Right Ear: Tympanic membrane, ear canal and external ear normal. No tenderness. No middle ear effusion. There is no impacted cerumen. Tympanic  membrane is not perforated, erythematous, retracted or bulging.     Left Ear: Tympanic membrane, ear canal and external ear normal. No tenderness.  No middle ear effusion. There is no impacted cerumen. Tympanic membrane is not perforated, erythematous, retracted or bulging.     Nose: Nose normal. No congestion.     Mouth/Throat:     Mouth: Mucous membranes are moist.     Pharynx: Uvula midline. No oropharyngeal exudate or posterior oropharyngeal erythema.  Eyes:     Extraocular  Movements: Extraocular movements intact.     Pupils: Pupils are equal, round, and reactive to light.  Cardiovascular:     Rate and Rhythm: Normal rate and regular rhythm.     Heart sounds: Normal heart sounds.  Pulmonary:     Effort: Pulmonary effort is normal.     Breath sounds: Normal breath sounds. No decreased breath sounds, wheezing, rhonchi or rales.  Abdominal:     Palpations: Abdomen is soft.     Tenderness: There is no abdominal tenderness. There is no guarding or rebound.  Neurological:     General: No focal deficit present.     Mental Status: She is alert and oriented to person, place, and time.  Psychiatric:        Mood and Affect: Mood normal.        Behavior: Behavior normal.        Thought Content: Thought content normal.        Judgment: Judgment normal.     UC Treatments / Results  Labs (all labs ordered are listed, but only abnormal results are displayed) Labs Reviewed - No data to display  EKG   Radiology No results found.  Procedures Procedures (including critical care time)  Medications Ordered in UC Medications - No data to display  Initial Impression / Assessment and Plan / UC Course  I have reviewed the triage vital signs and the nursing notes.  Pertinent labs & imaging results that were available during my care of the patient were reviewed by me and considered in my medical decision making (see chart for details).     This patient is a very pleasant 50 y.o. year old  female presenting with suspected sepsis. Febrile at 102.8 and tachycardic at 132.  Sent to the emergency department for evaluation, son declines transport via EMS in favor of personal vehicle.  They state that they will head straight there.  Level 5 as this patient was admitted for sepsis.  Final Clinical Impressions(s) / UC Diagnoses   Final diagnoses:  Febrile illness  Acute confusional state  History of sepsis     Discharge Instructions      -Head to ED for evaluation of possible sepsis   ED Prescriptions   None    PDMP not reviewed this encounter.   Rhys Martini, PA-C 04/08/21 1616    Rhys Martini, PA-C 04/09/21 [phone]

## 2021-04-08 NOTE — ED Triage Notes (Signed)
Per pt sons, pt acting confused, not eating and dizzy for the past 2 days.

## 2021-04-08 NOTE — ED Triage Notes (Signed)
Pt here sent from UC confused not eating according to family , pt just moans and groans in triage , answers some questions ,

## 2021-04-08 NOTE — ED Notes (Signed)
Patient is being discharged from the Urgent Care and sent to the Emergency Department via POV (son) . Per Cheree Ditto, [place], patient is in need of higher level of care due to cough. Patient is aware and verbalizes understanding of plan of care.  Vitals:   04/08/21 1605  BP: 118/73  Pulse: (!) 132  Resp: 20  Temp: (!) 102.8 F (39.3 C)  SpO2: 94%

## 2021-04-08 NOTE — ED Provider Notes (Signed)
[facility] EMERGENCY DEPARTMENT Provider Note   CSN: [id number] Arrival date & time: 04/08/21  1621     History No chief complaint on file.   Sheri Hall is a 50 y.o. female.  HPI Patient is a 50 year old female with history of bipolar disorder, PTSD, OCD, depression, COVID-19, sepsis, pneumonia, who presents to the emergency department due to worsening altered mental status.  Patient initially seen at urgent care and was sent to the emergency department for further evaluation.  Patient has difficulty answering questions but is currently A&O x3.  She appears fatigued and is at times answering questions clearly and at other times slow to respond.  She states that for the past week she has been having cough, rhinorrhea, as well as sore throat.  Denies any chest pain or shortness of breath.  Also reports associated nausea and vomiting.  No diarrhea or urinary complaints.  Patient denies any alcohol use, smoking, or drug use.  Level 5 caveat due to acuity of condition.    Past Medical History:  Diagnosis Date   Anxiety    Arthritis    rheumatoid    Asthma    seasonal   Bipolar disorder (HCC)    Depression    GERD (gastroesophageal reflux disease)    OCD (obsessive compulsive disorder)    PTSD (post-traumatic stress disorder)     Patient Active Problem List   Diagnosis Date Noted   Dehydration 03/19/2020   COVID-19 virus infection 03/19/2020   Bipolar I disorder, most recent episode mixed (HCC) 12/29/2019   Major depressive disorder, recurrent episode (HCC) 12/28/2019   Lobar pneumonia (HCC) 07/17/2018   Normocytic anemia 07/17/2018   Cocaine use 07/17/2018   Rheumatoid arthritis (HCC) 07/17/2018   Sepsis (HCC) 07/16/2018   PTSD (post-traumatic stress disorder) 09/17/2017   Substance use disorder 09/17/2017   Grief 09/17/2017   MDD (major depressive disorder), severe (HCC) 09/16/2017    Past Surgical History:  Procedure Laterality Date   CESAREAN  SECTION     OPEN REDUCTION INTERNAL FIXATION (ORIF) DISTAL RADIAL FRACTURE Left 10/15/2015   Procedure: OPEN TREATMENT OF LEFT DISTAL RADIUS FRACTURE;  Surgeon: Mack Hook, MD;  Location: [facility];  Service: Orthopedics;  Laterality: Left;     OB History   No obstetric history on file.     Family History  Problem Relation Age of Onset   Healthy Mother    Healthy Father     Social History   Tobacco Use   Smoking status: Every Day    Packs/day: 0.50    Types: Cigarettes   Smokeless tobacco: Never  Vaping Use   Vaping Use: Never used  Substance Use Topics   Alcohol use: Yes    Comment: occ; liquor   Drug use: Yes    Types: Cocaine, "Crack" cocaine    Comment: wednesday March 22nd 2017    Home Medications Prior to Admission medications   Medication Sig Start Date End Date Taking? Authorizing Provider  albuterol (VENTOLIN HFA) 108 (90 Base) MCG/ACT inhaler Inhale 2 puffs into the lungs every 6 (six) hours as needed for wheezing or shortness of breath. 03/26/20   [place], Eric J, DO  B Complex-Biotin-FA (VITAMIN B50 COMPLEX PO) Take 1 tablet by mouth daily.    [provider]  cyclobenzaprine (FLEXERIL) 10 MG tablet Take 1 tablet (10 mg total) by mouth 2 (two) times daily as needed for muscle spasms. 03/05/20   Moshe Cipro, NP  guaiFENesin-dextromethorphan [facility] DM) 100-10  MG/5ML syrup Take 10 mLs by mouth every 4 (four) hours as needed for cough. 03/26/20   [place], Alvira Philips, DO  hydrOXYzine (VISTARIL) 25 MG capsule Take 25 mg by mouth at bedtime. 03/13/20   [provider]  ibuprofen (ADVIL) 600 MG tablet Take 1 tablet (600 mg total) by mouth every 6 (six) hours as needed. 03/05/20   Moshe Cipro, NP  ipratropium (ATROVENT HFA) 17 MCG/ACT inhaler Inhale 2 puffs into the lungs every 6 (six) hours as needed for wheezing. 03/26/20   [place], Eric J, DO  ondansetron (ZOFRAN) 8 MG tablet Take 8 mg by mouth 3 (three) times daily as  needed. 03/13/20   [provider]  propranolol (INDERAL) 10 MG tablet Take 10 mg by mouth 2 (two) times daily as needed (high blood pressure).  03/13/20   [provider]  pyridOXINE (VITAMIN B-6) 100 MG tablet Take 100 mg by mouth daily.    [provider]    Allergies    Patient has no known allergies.  Review of Systems   Review of Systems  Unable to perform ROS: Acuity of condition   Physical Exam Updated Vital Signs BP 109/80   Pulse (!) 112   Temp (!) 100.6 F (38.1 C) (Oral)   Resp (!) 22   SpO2 97%   Physical Exam Vitals and nursing note reviewed.  Constitutional:      General: She is not in acute distress.    Appearance: Normal appearance. She is not ill-appearing, toxic-appearing or diaphoretic.  HENT:     Head: Normocephalic and atraumatic.     Right Ear: External ear normal.     Left Ear: External ear normal.     Nose: Nose normal.     Mouth/Throat:     Mouth: Mucous membranes are moist.     Pharynx: Oropharynx is clear. No oropharyngeal exudate or posterior oropharyngeal erythema.  Eyes:     General: No scleral icterus.       Right eye: No discharge.        Left eye: No discharge.     Extraocular Movements: Extraocular movements intact.     Conjunctiva/sclera: Conjunctivae normal.  Cardiovascular:     Rate and Rhythm: Normal rate and regular rhythm.     Pulses: Normal pulses.     Heart sounds: Normal heart sounds. No murmur heard.   No friction rub. No gallop.  Pulmonary:     Effort: Pulmonary effort is normal. No respiratory distress.     Breath sounds: Normal breath sounds. No stridor. No wheezing, rhonchi or rales.     Comments: LCTAB.  Patient actively coughing during my exam and produces green sputum with a small amount of blood. Abdominal:     General: Abdomen is flat.     Palpations: Abdomen is soft.     Tenderness: There is no abdominal tenderness.     Comments: Abdomen is flat and soft.  Patient is not exhibit signs  of pain with deep palpation.  Musculoskeletal:        General: Normal range of motion.     Cervical back: Normal range of motion and neck supple. No tenderness.  Skin:    General: Skin is warm and dry.  Neurological:     General: No focal deficit present.     Mental Status: She is alert and oriented to person, place, and time.     Comments: A&O x3.  Appears fatigued and at times is slow to answer questions.  Moving all 4 extremities with ease.  Following commands.  Psychiatric:        Mood and Affect: Mood normal.        Behavior: Behavior normal.    ED Results / Procedures / Treatments   Labs (all labs ordered are listed, but only abnormal results are displayed) Labs Reviewed  COMPREHENSIVE METABOLIC PANEL - Abnormal; Notable for the following components:      Result Value   Sodium 125 (*)    Potassium 3.3 (*)    Chloride 91 (*)    Glucose, Bld 114 (*)    BUN 21 (*)    Creatinine, Ser 1.02 (*)    Calcium 7.9 (*)    Total Protein 8.5 (*)    Albumin 2.2 (*)    AST 218 (*)    ALT 261 (*)    All other components within normal limits  LIPASE, BLOOD - Abnormal; Notable for the following components:   Lipase 61 (*)    All other components within normal limits  CBC WITH DIFFERENTIAL/PLATELET - Abnormal; Notable for the following components:   WBC 17.0 (*)    RBC 3.60 (*)    Hemoglobin 10.2 (*)    HCT 30.2 (*)    RDW 16.6 (*)    Neutro Abs 11.9 (*)    Lymphs Abs 4.3 (*)    All other components within normal limits  I-STAT CHEM 8, ED - Abnormal; Notable for the following components:   Sodium 128 (*)    Potassium 3.4 (*)    Chloride 93 (*)    BUN 21 (*)    Glucose, Bld 118 (*)    Calcium, Ion 0.99 (*)    Hemoglobin 10.9 (*)    HCT 32.0 (*)    All other components within normal limits  CBG MONITORING, ED - Abnormal; Notable for the following components:   Glucose-Capillary 118 (*)    All other components within normal limits  RESP PANEL BY RT-PCR (FLU A&B, COVID) [id number]   CULTURE, BLOOD (ROUTINE X 2)  CULTURE, BLOOD (ROUTINE X 2)  URINE CULTURE  LACTIC ACID, PLASMA  ETHANOL  AMMONIA  RAPID URINE DRUG SCREEN, HOSP PERFORMED  LACTIC ACID, PLASMA  URINALYSIS, ROUTINE W REFLEX MICROSCOPIC  PROCALCITONIN  CREATININE, URINE, RANDOM  NA AND K (SODIUM & POTASSIUM), RAND UR  OSMOLALITY, URINE  LEGIONELLA PNEUMOPHILA SEROGP 1 UR AG  PROTIME-INR  APTT  MISCELLANEOUS GENETIC TEST  OSMOLALITY  I-STAT BETA HCG BLOOD, ED ([facility], [facility], [facility] ONLY)    EKG EKG Interpretation  Date/Time:  Monday April 08 2021 18:42:23 EDT Ventricular Rate:  121 PR Interval:  124 QRS Duration: 80 QT Interval:  304 QTC Calculation: 431 R Axis:   43 Text Interpretation: Sinus tachycardia Otherwise normal ECG Similar to prior tracing Confirmed by Tanda Rockers ([id number]) on 04/08/2021 8:10:45 PM  Radiology DG Chest 2 View  Result Date: 04/08/2021 CLINICAL DATA:  Altered mental status and dizziness. EXAM: CHEST - 2 VIEW COMPARISON:  March 19, 2020 FINDINGS: Hazy, moderate severity infiltrate is seen within the right upper lobe. There is no evidence of a pleural effusion or pneumothorax. The heart size and mediastinal contours are within normal limits. The visualized skeletal structures are unremarkable. IMPRESSION: Moderate severity right upper lobe infiltrate. Electronically Signed   By: Aram Candela M.D.   On: 04/08/2021 19:22   CT HEAD WO CONTRAST ( )  Result Date: 04/08/2021 CLINICAL DATA:  Dizziness EXAM: CT HEAD WITHOUT CONTRAST TECHNIQUE: Contiguous axial images were  obtained from the base of the skull through the vertex without intravenous contrast. COMPARISON:  None. FINDINGS: Brain: No evidence of acute infarction, hemorrhage, hydrocephalus, extra-axial collection or mass lesion/mass effect. Vascular: No hyperdense vessel or unexpected calcification. Skull: Normal. Negative for fracture or focal lesion. Sinuses/Orbits: No acute finding. Other: None. IMPRESSION: No acute  abnormality noted. Electronically Signed   By: Alcide Clever M.D.   On: 04/08/2021 21:39    Procedures .Critical Care Performed by: [name], PA-C Authorized by: [name], PA-C   Critical care provider statement:    Critical care time (minutes):  45   Critical care was necessary to treat or prevent imminent or life-threatening deterioration of the following conditions:  Sepsis   Critical care was time spent personally by me on the following activities:  Discussions with consultants, evaluation of patient's response to treatment, examination of patient, ordering and performing treatments and interventions, ordering and review of laboratory studies, ordering and review of radiographic studies, pulse oximetry, re-evaluation of patient's condition, obtaining history from patient or surrogate and review of old charts   Medications Ordered in ED Medications  lactated ringers infusion ( Intravenous New Bag/Given 04/08/21 2127)  cefTRIAXone (ROCEPHIN) 2 g in sodium chloride 0.9 % 100 mL IVPB (2 g Intravenous New Bag/Given 04/08/21 2109)  azithromycin (ZITHROMAX) 500 mg in sodium chloride 0.9 % 250 mL IVPB (500 mg Intravenous New Bag/Given 04/08/21 2118)  ondansetron (ZOFRAN) injection 4 mg (has no administration in time range)  lactated ringers bolus 1,000 mL (1,000 mLs Intravenous New Bag/Given 04/08/21 2112)    And  lactated ringers bolus 1,000 mL (1,000 mLs Intravenous New Bag/Given 04/08/21 2111)    ED Course  I have reviewed the triage vital signs and the nursing notes.  Pertinent labs & imaging results that were available during my care of the patient were reviewed by me and considered in my medical decision making (see chart for details).    MDM Rules/Calculators/A&P                          Pt is a 50 y.o. female who presents to the emergency department due to what appears to be a developing right upper lobe pneumonia.  Code sepsis activated upon arrival.  Labs: CBC with  a white count of 17, hemoglobin of 10.2, RDW of 16.6, neutrophils 11.9, percent to 4.3. CMP with a sodium of 125, potassium 3.3, chloride of 91, glucose of 114, BUN 21, creatinine 1.02, calcium 7.9, total protein of 8.5, BUN of 2.2, AST of 218, ALT of 251. Lipase of 61. Lactic acid 1.1. Ammonia 34. Ethanol less than 10. Blood cultures obtained.  Imaging: CT scan of the head without contrast shows no acute abnormalities. Chest x-ray shows moderate severity right upper lobe infiltrate.  I, [name], PA-C, personally reviewed and evaluated these images and lab results as part of my medical decision-making.  Patient A&O x3 but at times has difficulty answering questions.  Urgent care as well as triage staff initially concerned about altered mental status as well.  CT scan was obtained of the head which was negative.  Patient febrile at 100.6 F and tachycardic at 124 bpm upon arrival.  Code sepsis initiated.  Patient given 2 L of IV fluids and started on Rocephin as well as azithromycin for likely community-acquired pneumonia.  Patient also found to be hyponatremic at 125.  Unsure the source.  Patient denies any alcohol use.  Do not see  any diuretics listed on her medications.  Urine creatinine, osmolality, sodium, potassium, as well as a serum osmolality are pending.  Patient will require admission for further work-up.  We will discussed with the medicine team.  Note: Portions of this report may have been transcribed using voice recognition software. Every effort was made to ensure accuracy; however, inadvertent computerized transcription errors may be present.   Final Clinical Impression(s) / ED Diagnoses Final diagnoses:  Community acquired pneumonia of right upper lobe of lung  Sepsis, due to unspecified organism, unspecified whether acute organ dysfunction present [facility])  Hyponatremia   Rx / DC Orders ED Discharge Orders     None        [name], PA-C 04/08/21  2148    Tanda Rockers A, DO 04/09/21 0005

## 2021-04-08 NOTE — Sepsis Progress Note (Signed)
Elink Following for Codes Sepsis

## 2021-04-08 NOTE — Discharge Instructions (Addendum)
-  Head to ED for evaluation of possible sepsis

## 2021-04-09 DIAGNOSIS — G9341 Metabolic encephalopathy: Secondary | ICD-10-CM

## 2021-04-09 DIAGNOSIS — E871 Hypo-osmolality and hyponatremia: Secondary | ICD-10-CM

## 2021-04-09 DIAGNOSIS — N179 Acute kidney failure, unspecified: Secondary | ICD-10-CM

## 2021-04-09 DIAGNOSIS — J189 Pneumonia, unspecified organism: Secondary | ICD-10-CM

## 2021-04-09 LAB — RAPID URINE DRUG SCREEN, HOSP PERFORMED
Amphetamines: NOT DETECTED
Barbiturates: NOT DETECTED
Benzodiazepines: NOT DETECTED
Cocaine: NOT DETECTED
Opiates: NOT DETECTED
Tetrahydrocannabinol: NOT DETECTED

## 2021-04-09 LAB — BASIC METABOLIC PANEL
Anion gap: 8 (ref 5–15)
Anion gap: 10 (ref 5–15)
BUN: 16 mg/dL (ref 6–20)
BUN: 15 mg/dL (ref 6–20)
CO2: 24 mmol/L (ref 22–32)
CO2: 23 mmol/L (ref 22–32)
Calcium: 7.6 mg/dL — ABNORMAL LOW (ref 8.9–10.3)
Calcium: 7.6 mg/dL — ABNORMAL LOW (ref 8.9–10.3)
Chloride: 97 mmol/L — ABNORMAL LOW (ref 98–111)
Chloride: 97 mmol/L — ABNORMAL LOW (ref 98–111)
Creatinine, Ser: 0.89 mg/dL (ref 0.44–1.00)
Creatinine, Ser: 0.86 mg/dL (ref 0.44–1.00)
GFR, Estimated: >60 mL/min (ref >60)
GFR, Estimated: >60 mL/min (ref >60)
Glucose, Bld: 103 mg/dL — ABNORMAL HIGH (ref 70–99)
Glucose, Bld: 105 mg/dL — ABNORMAL HIGH (ref 70–99)
Potassium: 3.3 mmol/L — ABNORMAL LOW (ref 3.5–5.1)
Potassium: 3.6 mmol/L (ref 3.5–5.1)
Sodium: 129 mmol/L — ABNORMAL LOW (ref 135–145)
Sodium: 130 mmol/L — ABNORMAL LOW (ref 135–145)

## 2021-04-09 LAB — URINALYSIS, MICROSCOPIC (REFLEX)

## 2021-04-09 LAB — HEPATIC FUNCTION PANEL
ALT: 174 U/L — ABNORMAL HIGH (ref 0–44)
AST: 130 U/L — ABNORMAL HIGH (ref 15–41)
Albumin: 1.8 g/dL — ABNORMAL LOW (ref 3.5–5.0)
Alkaline Phosphatase: 44 U/L (ref 38–126)
Bilirubin, Direct: 0.1 mg/dL (ref 0.0–0.2)
Indirect Bilirubin: 0.5 mg/dL (ref 0.3–0.9)
Total Bilirubin: 0.6 mg/dL (ref 0.3–1.2)
Total Protein: 7.7 g/dL (ref 6.5–8.1)

## 2021-04-09 LAB — CBC
HCT: 29.1 % — ABNORMAL LOW (ref 36.0–46.0)
Hemoglobin: 9.7 g/dL — ABNORMAL LOW (ref 12.0–15.0)
MCH: 28.5 pg (ref 26.0–34.0)
MCHC: 33.3 g/dL (ref 30.0–36.0)
MCV: 85.6 fL (ref 80.0–100.0)
Platelets: 157 10*3/uL (ref 150–400)
RBC: 3.4 MIL/uL — ABNORMAL LOW (ref 3.87–5.11)
RDW: 17.2 % — ABNORMAL HIGH (ref 11.5–15.5)
WBC: 13.7 10*3/uL — ABNORMAL HIGH (ref 4.0–10.5)
nRBC: 0 % (ref 0.0–0.2)

## 2021-04-09 LAB — APTT: aPTT: 34 seconds (ref 24–36)

## 2021-04-09 LAB — URINALYSIS, ROUTINE W REFLEX MICROSCOPIC
Bilirubin Urine: NEGATIVE
Glucose, UA: NEGATIVE mg/dL
Ketones, ur: NEGATIVE mg/dL
Leukocytes,Ua: NEGATIVE
Nitrite: NEGATIVE
Protein, ur: 30 mg/dL — AB
Specific Gravity, Urine: <1.005 — ABNORMAL LOW (ref 1.005–1.030)
pH: 6 (ref 5.0–8.0)

## 2021-04-09 LAB — PROTIME-INR
INR: 1.3 — ABNORMAL HIGH (ref 0.8–1.2)
Prothrombin Time: 16.2 seconds — ABNORMAL HIGH (ref 11.4–15.2)

## 2021-04-09 LAB — HEMOGLOBIN AND HEMATOCRIT, BLOOD
HCT: 27 % — ABNORMAL LOW (ref 36.0–46.0)
HCT: 27.9 % — ABNORMAL LOW (ref 36.0–46.0)
Hemoglobin: 8.7 g/dL — ABNORMAL LOW (ref 12.0–15.0)
Hemoglobin: 9.3 g/dL — ABNORMAL LOW (ref 12.0–15.0)

## 2021-04-09 LAB — SODIUM, URINE, RANDOM: Sodium, Ur: 20 mmol/L

## 2021-04-09 LAB — OSMOLALITY, URINE: Osmolality, Ur: 280 mOsm/kg — ABNORMAL LOW (ref 300–900)

## 2021-04-09 LAB — TSH: TSH: 0.088 u[IU]/mL — ABNORMAL LOW (ref 0.350–4.500)

## 2021-04-09 LAB — HIV ANTIBODY (ROUTINE TESTING W REFLEX): HIV Screen 4th Generation wRfx: NONREACTIVE

## 2021-04-09 LAB — VITAMIN B12: Vitamin B-12: 566 pg/mL (ref 180–914)

## 2021-04-09 LAB — MAGNESIUM: Magnesium: 1.9 mg/dL (ref 1.7–2.4)

## 2021-04-09 MED ORDER — PANTOPRAZOLE SODIUM 40 MG IV SOLR
40.0000 mg | INTRAVENOUS | Status: DC
  Administered 2021-04-09 – 2021-04-10 (×2): 40 mg via INTRAVENOUS
  Filled 2021-04-09 (×2): qty 40

## 2021-04-09 MED ORDER — ACETAMINOPHEN 325 MG PO TABS
650.0000 mg | ORAL_TABLET | Freq: Four times a day (QID) | ORAL | Status: DC | PRN
  Administered 2021-04-09 (×2): 650 mg via ORAL
  Filled 2021-04-09 (×2): qty 2

## 2021-04-09 MED ORDER — SODIUM CHLORIDE 0.9 % IV SOLN: INTRAVENOUS | Status: DC

## 2021-04-09 MED ORDER — SODIUM CHLORIDE 0.9 % IV SOLN
INTRAVENOUS | Status: DC
  Administered 2021-04-09: 75 mL via INTRAVENOUS

## 2021-04-09 MED ORDER — SALINE SPRAY 0.65 % NA SOLN
1.0000 | NASAL | Status: DC | PRN
  Filled 2021-04-09: qty 44

## 2021-04-09 MED ORDER — OXYMETAZOLINE HCL 0.05 % NA SOLN
3.0000 | Freq: Four times a day (QID) | NASAL | Status: DC
  Administered 2021-04-09 – 2021-04-10 (×5): 3 via NASAL
  Filled 2021-04-09: qty 30

## 2021-04-09 MED ORDER — SODIUM CHLORIDE 0.9 % IV BOLUS
500.0000 mL | Freq: Once | INTRAVENOUS | Status: AC
  Administered 2021-04-09: 500 mL via INTRAVENOUS

## 2021-04-09 MED ORDER — ENOXAPARIN SODIUM 40 MG/0.4ML IJ SOSY: 40.0000 mg | PREFILLED_SYRINGE | INTRAMUSCULAR | Status: DC

## 2021-04-09 MED ORDER — OXYMETAZOLINE HCL 0.05 % NA SOLN
3.0000 | NASAL | Status: DC | PRN
  Administered 2021-04-09: 3 via NASAL
  Filled 2021-04-09: qty 30

## 2021-04-09 MED ORDER — INFLUENZA VAC SPLIT QUAD 0.5 ML IM SUSY
0.5000 mL | PREFILLED_SYRINGE | INTRAMUSCULAR | Status: DC
  Filled 2021-04-09: qty 0.5

## 2021-04-09 MED ORDER — POTASSIUM CHLORIDE CRYS ER 20 MEQ PO TBCR
40.0000 meq | EXTENDED_RELEASE_TABLET | Freq: Once | ORAL | Status: AC
  Administered 2021-04-09: 40 meq via ORAL
  Filled 2021-04-09: qty 2

## 2021-04-09 MED ORDER — POLYVINYL ALCOHOL 1.4 % OP SOLN
1.0000 [drp] | OPHTHALMIC | Status: DC | PRN
  Administered 2021-04-09: 1 [drp] via OPHTHALMIC
  Filled 2021-04-09: qty 15

## 2021-04-09 MED ORDER — ALBUTEROL SULFATE (2.5 MG/3ML) 0.083% IN NEBU: 2.5000 mg | INHALATION_SOLUTION | Freq: Four times a day (QID) | RESPIRATORY_TRACT | Status: DC | PRN

## 2021-04-09 NOTE — Progress Notes (Signed)
Pt seen again in 2w6. No more bleeding from nose and when she coughed earlier no more blood. Asked her to notify RN if she gets any recurring bleeding. She is on room air and has no complaints.

## 2021-04-09 NOTE — Progress Notes (Signed)
Patient seen and examined personally, I reviewed the chart, history and physical and admission note, done by admitting physician this morning and agree with the same with following addendum.  Please refer to the morning admission note for more detailed plan of care.  Briefly,   50 year old female with asthma, arthritis anxiety/bipolar/depression/OCD/PTSD, GERD seen in the ED for altered mental status, poor oral intake, cough In the ED alert oriented x3 but slow to respond, vitals showed fever tachycardia tachypnea, leukocytosis, hyponatremia hypokalemia with abnormal LFTs, normal lactic acid UA, UDS pending alcohol level undetectable ammonia level normal chest x-ray consistent with right upper lobe infiltrate CT head no acute finding COVID-19 negative.  Procal up 12, Patient was given high-volume bolus 30 cc/kg, IV antibiotics and blood cultures were sent and admitted for further management.  Seen in ED, Tells me she started a new job for 4 wks and was overworking. Slept on Saturday and could not wake up till Monday, did not go to work had no recollection of time or day and " I was delerious". Nose bleeding intermittently  abt 5 days morr when she blows her nose and also spits blood.  On exam pleasant, aao x3. No chest pain, SOB, on RA  C/o nausea and vomiting but not as bad since she is on iv she says Moving all extremities,  ISSUES  Severe sepsis due to CAP  CAP Severe sepsis criteria met with fever, RR 22/m HR 124, WBC 17K- source CAP, organ damage w/ encephalopathy.  BP soft normal lactic acid.  Patient is alert and oriented.  Continue ceftriaxone/azithromycin, follow-up blood culture.  Acute metabolic encephalopathy due to sepsis-other work-up unrevealing  Hyponatremia with poor oral intake.  Improving nicely on ivf-avoid overcorrection.check serial sodium, TSH, urine electrolytes.cont on IVF. Recent Labs  Lab 04/08/21 1832 04/08/21 1904 04/09/21 0805  NA 125* 128* 129*     AKI-cont ivf Hypokalemia-repleted this am  Epistaxis Coughing up blood/Hymoptysis?: likely epistaxis. Also has pneumonia-Hold lovenox, consulted ENT and discussed with Dr. Wilburn Cornelia advised Afrin nasal spray, saline nasal spray, not to blow the nose if recurrent may need packing, and he can be contacted.  Monitor serial h/h. ?? Hemoptysis- will d/w PCCM.  Discussed with PCCM PA  Rahul, if becomes recurrent issues even after Afrin they are available to see. Checked with RN 1.40 pm-patient doing okay after afrin nasal spray and no more coughing blood she said.  Mild transaminitis-likely from sepsis monitor  Hx of RA- on humira/methotrexate-med rec pending  Asthma-monitor Mood disorder w/ anxiety/bipolar/depression/OCD/PTSD: Continue home meds Pharmacy consulted to compelete meds rec

## 2021-04-09 NOTE — ED Notes (Signed)
Pt coughing up small amounts of blood in a cup. Notified provider

## 2021-04-09 NOTE — H&P (Signed)
History and Physical    Sheri Hall VWU:[id number] DOB: [birth year] DOA: 04/08/2021  PCP: Trey Sailors, PA Patient coming from: Home  Chief Complaint: Altered mental status  HPI: Sheri Hall is a 50 y.o. female with medical history significant of asthma, arthritis, anxiety, bipolar disorder, depression, OCD, PTSD, GERD presented to the ED for evaluation of confusion, poor oral intake, and cough.  In the ED, patient was AAO x3 but was slow to respond to questions.  Febrile, tachycardic, and tachypneic.  Not hypoxic.  Labs notable for WBC 17.0.  Hemoglobin 10.2 (stable).  Sodium 125.  Potassium 3.3.  Chloride 91.  BUN 21, creatinine 1.0 (baseline around 0.5).  AST 218, ALT 261.  Alk phos and T bili normal.  No significant elevation of lipase.  UDS pending.  Lactic acid normal x2.  UA pending.  Blood ethanol level undetectable.  Ammonia level normal.  Blood culture x2 drawn.  Procalcitonin elevated at 12.14.  COVID and influenza PCR negative.  Chest x-ray showing moderate severity right upper lobe infiltrate.  Head CT negative for acute finding. Patient was given antibiotics for CAP and 30 cc/kg fluid boluses per sepsis protocol.  History limited as patient is very slow to respond to questions.  States she was sleeping a lot for the past few days and not eating.  Also coughing.  Feeling nauseous but able to drink water.  Denies shortness of breath.  Review of Systems:  All systems reviewed and apart from history of presenting illness, are negative.  Past Medical History:  Diagnosis Date   Anxiety    Arthritis    rheumatoid    Asthma    seasonal   Bipolar disorder (HCC)    Depression    GERD (gastroesophageal reflux disease)    OCD (obsessive compulsive disorder)    PTSD (post-traumatic stress disorder)     Past Surgical History:  Procedure Laterality Date   CESAREAN SECTION     OPEN REDUCTION INTERNAL FIXATION (ORIF) DISTAL RADIAL FRACTURE Left 10/15/2015   Procedure: OPEN  TREATMENT OF LEFT DISTAL RADIUS FRACTURE;  Surgeon: Milly Jakob, MD;  Location: [place];  Service: Orthopedics;  Laterality: Left;     reports that she has been smoking cigarettes. She has been smoking an average of .5 packs per day. She has never used smokeless tobacco. She reports current alcohol use. She reports current drug use. Drugs: Cocaine and "Crack" cocaine.  No Known Allergies  Family History  Problem Relation Age of Onset   Healthy Mother    Healthy Father     Prior to Admission medications   Medication Sig Start Date End Date Taking? Authorizing Provider  albuterol (VENTOLIN HFA) 108 (90 Base) MCG/ACT inhaler Inhale 2 puffs into the lungs every 6 (six) hours as needed for wheezing or shortness of breath. 03/26/20   [place] Eric J, DO  B Complex-Biotin-FA (VITAMIN B50 COMPLEX PO) Take 1 tablet by mouth daily.    [provider]  cyclobenzaprine (FLEXERIL) 10 MG tablet Take 1 tablet (10 mg total) by mouth 2 (two) times daily as needed for muscle spasms. 03/05/20   Faustino Congress, NP  guaiFENesin-dextromethorphan (ROBITUSSIN DM) 100-10 MG/5ML syrup Take 10 mLs by mouth every 4 (four) hours as needed for cough. 03/26/20   [place] Donnamarie Poag, DO  hydrOXYzine (VISTARIL) 25 MG capsule Take 25 mg by mouth at bedtime. 03/13/20   [provider]  ibuprofen (ADVIL) 600 MG tablet Take 1 tablet (600 mg total) by mouth every  6 (six) hours as needed. 03/05/20   Faustino Congress, NP  ipratropium (ATROVENT HFA) 17 MCG/ACT inhaler Inhale 2 puffs into the lungs every 6 (six) hours as needed for wheezing. 03/26/20   [place] Eric J, DO  ondansetron (ZOFRAN) 8 MG tablet Take 8 mg by mouth 3 (three) times daily as needed. 03/13/20   [provider]  propranolol (INDERAL) 10 MG tablet Take 10 mg by mouth 2 (two) times daily as needed (high blood pressure).  03/13/20   [provider]  pyridOXINE (VITAMIN B-6) 100 MG tablet Take 100 mg by mouth daily.     [provider]    Physical Exam: Vitals:   04/08/21 2115 04/08/21 2145 04/08/21 2315 04/09/21 0121  BP: 109/80 110/76  98/67  Pulse: (!) 112 (!) 113 (!) 113 (!) 120  Resp: (!) 22 (!) '26 19 12  ' Temp:      TempSrc:      SpO2: 97% 94% 96% 98%    Physical Exam Constitutional:      General: She is not in acute distress. HENT:     Head: Normocephalic and atraumatic.  Eyes:     Extraocular Movements: Extraocular movements intact.  Cardiovascular:     Rate and Rhythm: Normal rate and regular rhythm.     Pulses: Normal pulses.  Pulmonary:     Effort: Pulmonary effort is normal. No respiratory distress.     Breath sounds: No wheezing.  Abdominal:     General: Bowel sounds are normal. There is no distension.     Palpations: Abdomen is soft.     Tenderness: There is no abdominal tenderness. There is no guarding or rebound.  Musculoskeletal:        General: No swelling or tenderness.     Cervical back: Normal range of motion and neck supple.  Skin:    General: Skin is warm and dry.  Neurological:     General: No focal deficit present.     Mental Status: She is alert.     Comments: Oriented to person, place, month, and year Very slow to respond to questions     Labs on Admission: I have personally reviewed following labs and imaging studies  CBC: Recent Labs  Lab 04/08/21 1832 04/08/21 1904  WBC 17.0*  --   NEUTROABS 11.9*  --   HGB 10.2* 10.9*  HCT 30.2* 32.0*  MCV 83.9  --   PLT 174  --    Basic Metabolic Panel: Recent Labs  Lab 04/08/21 1832 04/08/21 1904  NA 125* 128*  K 3.3* 3.4*  CL 91* 93*  CO2 23  --   GLUCOSE 114* 118*  BUN 21* 21*  CREATININE 1.02* 0.90  CALCIUM 7.9*  --    GFR: CrCl cannot be calculated (Unknown ideal weight.). Liver Function Tests: Recent Labs  Lab 04/08/21 1832  AST 218*  ALT 261*  ALKPHOS 57  BILITOT 1.1  PROT 8.5*  ALBUMIN 2.2*   Recent Labs  Lab 04/08/21 1832  LIPASE 61*   Recent Labs  Lab  04/08/21 1836  AMMONIA 34   Coagulation Profile: Recent Labs  Lab 04/08/21 2110  INR 1.2   Cardiac Enzymes: No results for input(s): CKTOTAL, CKMB, CKMBINDEX, TROPONINI in the last 168 hours. BNP (last 3 results) No results for input(s): PROBNP in the last 8760 hours. HbA1C: No results for input(s): HGBA1C in the last 72 hours. CBG: Recent Labs  Lab 04/08/21 1837  GLUCAP 118*   Lipid Profile: No  results for input(s): CHOL, HDL, LDLCALC, TRIG, CHOLHDL, LDLDIRECT in the last 72 hours. Thyroid Function Tests: No results for input(s): TSH, T4TOTAL, FREET4, T3FREE, THYROIDAB in the last 72 hours. Anemia Panel: No results for input(s): VITAMINB12, FOLATE, FERRITIN, TIBC, IRON, RETICCTPCT in the last 72 hours. Urine analysis:    Component Value Date/Time   COLORURINE YELLOW 03/20/2020 2158   APPEARANCEUR CLEAR 03/20/2020 2158   LABSPEC 1.010 03/20/2020 2158   PHURINE 6.0 03/20/2020 2158   GLUCOSEU NEGATIVE 03/20/2020 2158   HGBUR NEGATIVE 03/20/2020 2158   BILIRUBINUR NEGATIVE 03/20/2020 2158   KETONESUR NEGATIVE 03/20/2020 2158   PROTEINUR NEGATIVE 03/20/2020 2158   NITRITE NEGATIVE 03/20/2020 2158   LEUKOCYTESUR NEGATIVE 03/20/2020 2158    Radiological Exams on Admission: DG Chest 2 View  Result Date: 04/08/2021 CLINICAL DATA:  Altered mental status and dizziness. EXAM: CHEST - 2 VIEW COMPARISON:  March 19, 2020 FINDINGS: Hazy, moderate severity infiltrate is seen within the right upper lobe. There is no evidence of a pleural effusion or pneumothorax. The heart size and mediastinal contours are within normal limits. The visualized skeletal structures are unremarkable. IMPRESSION: Moderate severity right upper lobe infiltrate. Electronically Signed   By: Virgina Norfolk M.D.   On: 04/08/2021 19:22   CT HEAD WO CONTRAST (5MM)  Result Date: 04/08/2021 CLINICAL DATA:  Dizziness EXAM: CT HEAD WITHOUT CONTRAST TECHNIQUE: Contiguous axial images were obtained from the base  of the skull through the vertex without intravenous contrast. COMPARISON:  None. FINDINGS: Brain: No evidence of acute infarction, hemorrhage, hydrocephalus, extra-axial collection or mass lesion/mass effect. Vascular: No hyperdense vessel or unexpected calcification. Skull: Normal. Negative for fracture or focal lesion. Sinuses/Orbits: No acute finding. Other: None. IMPRESSION: No acute abnormality noted. Electronically Signed   By: Inez Catalina M.D.   On: 04/08/2021 21:39    EKG: Independently reviewed.  Sinus tachycardia.  Assessment/Plan Principal Problem:   CAP (community acquired pneumonia) Active Problems:   Sepsis ([place])   Hyponatremia   AKI (acute kidney injury) ([place])   Acute metabolic encephalopathy   Sepsis secondary to community-acquired pneumonia Febrile, tachycardic, and tachypneic on arrival.  WBC 17.0.  Procalcitonin elevated at 12.14.  No lactic acidosis or hypotension.  Chest x-ray showing moderate severity right upper lobe infiltrate.  COVID and influenza PCR negative.  Not hypoxic, satting in the mid 90s on room air. -Continue ceftriaxone and azithromycin.  Patient was given 30 cc/kg fluid boluses per sepsis protocol in the ED.  Check Legionella urinary antigen given hyponatremia.  Blood culture x2 drawn.  Continuous pulse ox, supplemental oxygen if needed.  Hyponatremia In setting of poor oral intake. -Continue gentle IV fluid hydration.  Serial BMPs.  Check serum osmolarity, urine sodium, and urine osmolarity.  Check TSH level.  Acute metabolic encephalopathy Likely multifactorial from hyponatremia and pneumonia.  Head CT negative for acute finding and neuro exam nonfocal.  Ammonia level normal.  Blood ethanol level undetectable. -Continue management as mentioned above.  Check TSH and B12 levels.  UDS pending.  AKI BUN 21, creatinine 1.0 (baseline around 0.5).  Likely prerenal from dehydration in the setting of poor oral intake.  Creatinine improved to 0.9 after IV  fluid boluses. -Continue gentle IV fluid hydration.  Monitor renal function and urine output.  Avoid nephrotoxic agents.  Mild transaminitis Likely due to sepsis. -Repeat LFTs, if no improvement, order hepatitis panel and right upper quadrant ultrasound.  Mild hypokalemia -Monitor potassium and magnesium levels, replenish as needed.  Asthma Stable.  Not wheezing. -  Albuterol as needed  Mood disorder -Pharmacy med rec pending.  DVT prophylaxis: Lovenox Code Status: Full code Family Communication: No family available at this time. Disposition Plan: Status is: Inpatient  Remains inpatient appropriate because:Inpatient level of care appropriate due to severity of illness  Dispo: The patient is from: Home              Anticipated d/c is to: Home              Patient currently is not medically stable to d/c.   Difficult to place patient No  Level of care: Level of care: Telemetry Medical  The medical decision making on this patient was of high complexity and the patient is at high risk for clinical deterioration, therefore this is a level 3 visit.  Shela Leff MD [facility]  If 7PM-7AM, please contact night-coverage [email or web address]  04/09/2021, 4:43 AM

## 2021-04-09 NOTE — ED Notes (Signed)
Provider notified of pt coughing up blood

## 2021-04-10 LAB — CBC
HCT: 26.2 % — ABNORMAL LOW (ref 36.0–46.0)
Hemoglobin: 8.6 g/dL — ABNORMAL LOW (ref 12.0–15.0)
MCH: 27.7 pg (ref 26.0–34.0)
MCHC: 32.8 g/dL (ref 30.0–36.0)
MCV: 84.5 fL (ref 80.0–100.0)
Platelets: 166 10*3/uL (ref 150–400)
RBC: 3.1 MIL/uL — ABNORMAL LOW (ref 3.87–5.11)
RDW: 17.2 % — ABNORMAL HIGH (ref 11.5–15.5)
WBC: 12.8 10*3/uL — ABNORMAL HIGH (ref 4.0–10.5)
nRBC: 0 % (ref 0.0–0.2)

## 2021-04-10 LAB — BASIC METABOLIC PANEL
Anion gap: 7 (ref 5–15)
BUN: 13 mg/dL (ref 6–20)
CO2: 25 mmol/L (ref 22–32)
Calcium: 7.5 mg/dL — ABNORMAL LOW (ref 8.9–10.3)
Chloride: 103 mmol/L (ref 98–111)
Creatinine, Ser: 0.67 mg/dL (ref 0.44–1.00)
GFR, Estimated: >60 mL/min (ref >60)
Glucose, Bld: 97 mg/dL (ref 70–99)
Potassium: 3.6 mmol/L (ref 3.5–5.1)
Sodium: 135 mmol/L (ref 135–145)

## 2021-04-10 LAB — OSMOLALITY: Osmolality: 280 mOsm/kg (ref 275–295)

## 2021-04-10 MED ORDER — GUAIFENESIN-DM 100-10 MG/5ML PO SYRP: 10.0000 mL | ORAL_SOLUTION | ORAL | Status: DC | PRN

## 2021-04-10 MED ORDER — IPRATROPIUM BROMIDE 0.02 % IN SOLN: 0.5000 mg | Freq: Four times a day (QID) | RESPIRATORY_TRACT | Status: DC | PRN

## 2021-04-10 MED ORDER — PANTOPRAZOLE SODIUM 40 MG PO TBEC: 40.0000 mg | DELAYED_RELEASE_TABLET | Freq: Every day | ORAL | Status: DC

## 2021-04-10 MED ORDER — ALBUTEROL SULFATE HFA 108 (90 BASE) MCG/ACT IN AERS: 2.0000 | INHALATION_SPRAY | Freq: Four times a day (QID) | RESPIRATORY_TRACT | Status: DC | PRN

## 2021-04-10 MED ORDER — FOLIC ACID 1 MG PO TABS
1.0000 mg | ORAL_TABLET | Freq: Every day | ORAL | Status: DC
  Administered 2021-04-10: 1 mg via ORAL
  Filled 2021-04-10: qty 1

## 2021-04-10 MED ORDER — PANTOPRAZOLE SODIUM 40 MG PO TBEC: 40.0000 mg | DELAYED_RELEASE_TABLET | Freq: Two times a day (BID) | ORAL | Status: DC

## 2021-04-10 NOTE — Progress Notes (Signed)
PROGRESS NOTE    Sheri Hall  IWL:[id number] DOB: [birth year] DOA: 04/08/2021 PCP: Trey Sailors, PA   Brief Narrative/Hospital Course: 50 year old female with asthma, arthritis anxiety/bipolar/depression/OCD/PTSD, GERD seen in the ED for altered mental status, poor oral intake, cough In the ED alert oriented x3 but slow to respond, vitals showed fever tachycardia tachypnea, leukocytosis, hyponatremia hypokalemia with abnormal LFTs, normal lactic acid UA, UDS pending alcohol level undetectable ammonia level normal chest x-ray consistent with right upper lobe infiltrate CT head no acute finding COVID-19 negative.  Procal up 12, Patient was given high-volume bolus 30 cc/kg, IV antibiotics and blood cultures were sent and admitted for further management. Subjective: Seen this am Overnight vitals stable, on room air Leukocytosis improving. She feels somewhat dizzy no more bleeding in the nose or cough  Assessment & Plan: Severe sepsis due to CAP  CAP: Severe sepsis criteria met with fever, RR 22/m HR 124, WBC 17K- source CAP, organ damage w/ encephalopathy.Continue ceftriaxone/azithromycin, follow-up blood culture.  Encourage ambulation incentive spirometry  Acute metabolic encephalopathy due to sepsis-resolved.   Dizziness, check orthostatic vitals discussed with nurse Hyponatremia with poor oral intake.  Improved, will stop IV fluids Recent Labs  Lab 04/08/21 1832 04/08/21 1904 04/09/21 0805 04/09/21 1200 04/10/21 0149  NA 125* 128* 129* 130* 135    AKI-resolved.  Stop IV fluids Hypokalemia-resolved  Epistaxis Coughing up blood/Hymoptysis?: likely epistaxis.consulted ENT and discussed with Dr. Wilburn Cornelia advised Afrin nasal spray, saline nasal spray, not to blow the nose if recurrent may need packing, patient can be followed up as outpatient if no recurrence.  Discussed with PCCM.  Seems to have resolved.  Change afrin to prn  Mild transaminitis-likely from sepsis monitor.   Holding methotrexate for now  Hx of RA- on humira every 2 week and methotrexate q. weekly.  Monitor LFTs before resuming methotrexate due for Humira next wk  Anemia likely from chronic disease check anemia panel.  Continue her folate  Asthma-monitor Mood disorder w/ anxiety/bipolar/depression/OCD/PTSD: Mood is stable.    Diet Order             Diet Heart Room service appropriate? Yes; Fluid consistency: Thin  Diet effective now                 DVT prophylaxis: Place and maintain sequential compression device Start: 04/10/21 0733 Code Status:   Code Status: Full Code  Family Communication: plan of care discussed with patient at bedside.  Status is: Inpatient Remains inpatient appropriate because:IV treatments appropriate due to intensity of illness or inability to take PO and Inpatient level of care appropriate due to severity of illness Dispo: The patient is from: Home              Anticipated d/c is to: Home              Patient currently is not medically stable to d/c.   Difficult to place patient No Objective: Vitals: Today's Vitals   04/09/21 2330 04/10/21 0502 04/10/21 0600 04/10/21 0717  BP: 97/63 101/67    Pulse: 79 85    Resp: 18 16    Temp: 98.8 F (37.1 C) 98 F (36.7 C)    TempSrc: Oral Oral    SpO2: 98% 100%    Weight:   66.8 kg   Height:   '5\' 1"'  (1.549 m)   PainSc:    0-No pain   Examination:  General exam: AA0X3,older than stated age HEENT:Oral mucosa moist, Ear/Nose WNL grossly,dentition normal. Respiratory  system: bilaterally clear no use of accessory muscle, non tender. Cardiovascular system: S1 & S2 +,No JVD. Gastrointestinal system: Abdomen soft, NT,ND, BS+. Nervous System:Alert, awake, moving extremities Extremities: No edema, distal peripheral pulses palpable.  Skin: No rashes,no icterus. MSK: Normal muscle bulk,tone, power   Intake/Output Summary (Last 24 hours) at 04/10/2021 0918 Last data filed at 04/10/2021 0300 Gross per 24 hour   Intake 2101.18 ml  Output --  Net 2101.18 ml   Filed Weights   04/10/21 0600  Weight: 66.8 kg   Weight change:    Consultants:see note  Procedures:see note Antimicrobials: Anti-infectives (From admission, onward)    Start     Dose/Rate Route Frequency Ordered Stop   04/08/21 2030  cefTRIAXone (ROCEPHIN) 2 g in sodium chloride 0.9 % 100 mL IVPB        2 g 200 mL/hr over 30 Minutes Intravenous Every 24 hours 04/08/21 2028 04/13/21 2029   04/08/21 2030  azithromycin (ZITHROMAX) 500 mg in sodium chloride 0.9 % 250 mL IVPB        500 mg 250 mL/hr over 60 Minutes Intravenous Every 24 hours 04/08/21 2028 04/13/21 2029      Culture/Microbiology    Component Value Date/Time   SDES BLOOD RIGHT ANTECUBITAL 04/08/2021 2109   SPECREQUEST  04/08/2021 2109    BOTTLES DRAWN AEROBIC AND ANAEROBIC Blood Culture adequate volume   CULT  04/08/2021 2109    NO GROWTH < 12 HOURS Performed at [facility] [address], [place], [place] [address]    REPTSTATUS PENDING 04/08/2021 2109    Other culture-see note  Unresulted Labs (From admission, onward)     Start     Ordered   04/11/21 0500  Hepatic function panel  Tomorrow morning,   R        04/10/21 0731   04/11/21 0500  Vitamin B12  (Anemia Panel (PNL))  Tomorrow morning,   R        04/10/21 0732   04/11/21 0500  Folate  (Anemia Panel (PNL))  Tomorrow morning,   R        04/10/21 0732   04/11/21 0500  Iron and TIBC  (Anemia Panel (PNL))  Tomorrow morning,   R        04/10/21 0732   04/11/21 0500  Ferritin  (Anemia Panel (PNL))  Tomorrow morning,   R        04/10/21 0732   04/11/21 0500  Reticulocytes  (Anemia Panel (PNL))  Tomorrow morning,   R        04/10/21 0732   04/10/21 7741  Basic metabolic panel  Daily,   R     Question:  Specimen collection method  Answer:  Lab=Lab collect   04/09/21 1350   04/10/21 0500  CBC  Daily,   R     Question:  Specimen collection method  Answer:  Lab=Lab collect   04/09/21 1350    04/08/21 2031  Miscellaneous Genetic Test (non-interfaced send-out)  Once,   STAT       Comments: Septicyte   04/08/21 2031   04/08/21 2027  Urine Culture  (Septic presentation on arrival (screening labs, nursing and treatment orders for obvious sepsis))  ONCE - STAT,   AD       Question:  Indication  Answer:  Sepsis   04/08/21 2028   04/08/21 2010  Legionella Pneumophila Serogp 1 Ur Ag  Once,   STAT        04/08/21 2009  Medications reviewed: \ Scheduled Meds:  folic acid  1 mg Oral Daily   influenza vac split quadrivalent PF  0.5 mL Intramuscular Tomorrow-1000   oxymetazoline  3 spray Each Nare Q6H   pantoprazole (PROTONIX) IV  40 mg Intravenous Q24H   Continuous Infusions:  azithromycin 500 mg (04/09/21 2142)   cefTRIAXone (ROCEPHIN)  IV 2 g (04/09/21 2109)     Intake/Output from previous day: 09/20 0701 - 09/21 0700 In: 2101.2 [I.V.:1251.2; IV Piggyback:850] Out: -  Intake/Output this shift: No intake/output data recorded. Filed Weights   04/10/21 0600  Weight: 66.8 kg   Data Reviewed: I have personally reviewed following labs and imaging studies CBC: Recent Labs  Lab 04/08/21 1832 04/08/21 1904 04/09/21 0805 04/09/21 1715 04/09/21 1953 04/10/21 0149  WBC 17.0*  --  13.7*  --   --  12.8*  NEUTROABS 11.9*  --   --   --   --   --   HGB 10.2* 10.9* 9.7* 8.7* 9.3* 8.6*  HCT 30.2* 32.0* 29.1* 27.0* 27.9* 26.2*  MCV 83.9  --  85.6  --   --  84.5  PLT 174  --  157  --   --  003   Basic Metabolic Panel: Recent Labs  Lab 04/08/21 1832 04/08/21 1904 04/09/21 0805 04/09/21 1200 04/10/21 0149  NA 125* 128* 129* 130* 135  K 3.3* 3.4* 3.3* 3.6 3.6  CL 91* 93* 97* 97* 103  CO2 23  --  '24 23 25  ' GLUCOSE 114* 118* 103* 105* 97  BUN 21* 21* '16 15 13  ' CREATININE 1.02* 0.90 0.89 0.86 0.67  CALCIUM 7.9*  --  7.6* 7.6* 7.5*  MG  --   --  1.9  --   --    GFR: Estimated Creatinine Clearance: 73.6 mL/min (by C-G formula based on SCr of 0.67 mg/dL). Liver  Function Tests: Recent Labs  Lab 04/08/21 1832 04/09/21 0805  AST 218* 130*  ALT 261* 174*  ALKPHOS 57 44  BILITOT 1.1 0.6  PROT 8.5* 7.7  ALBUMIN 2.2* 1.8*   Recent Labs  Lab 04/08/21 1832  LIPASE 61*   Recent Labs  Lab 04/08/21 1836  AMMONIA 34   Coagulation Profile: Recent Labs  Lab 04/08/21 2110 04/09/21 1200  INR 1.2 1.3*   Cardiac Enzymes: No results for input(s): CKTOTAL, CKMB, CKMBINDEX, TROPONINI in the last 168 hours. BNP (last 3 results) No results for input(s): PROBNP in the last 8760 hours. HbA1C: No results for input(s): HGBA1C in the last 72 hours. CBG: Recent Labs  Lab 04/08/21 1837  GLUCAP 118*   Lipid Profile: No results for input(s): CHOL, HDL, LDLCALC, TRIG, CHOLHDL, LDLDIRECT in the last 72 hours. Thyroid Function Tests: Recent Labs    04/09/21 0805  TSH 0.088*   Anemia Panel: Recent Labs    04/09/21 0805  VITAMINB12 566   Sepsis Labs: Recent Labs  Lab 04/08/21 1832 04/08/21 2110  PROCALCITON  --  12.14  LATICACIDVEN 1.1 1.9    Recent Results (from the past 240 hour(s))  Blood culture (routine x 2)     Status: None (Preliminary result)   Collection Time: 04/08/21  9:00 PM   Specimen: BLOOD  Result Value Ref Range Status   Specimen Description BLOOD LEFT ANTECUBITAL  Final   Special Requests   Final    BOTTLES DRAWN AEROBIC AND ANAEROBIC Blood Culture adequate volume   Culture   Final    NO GROWTH < 12 HOURS Performed at [facility], [place] [address], [place], [place] [address]    Report Status PENDING  Incomplete  Blood culture (routine x 2)     Status: None (Preliminary result)   Collection Time: 04/08/21  9:09 PM   Specimen: BLOOD  Result Value Ref Range Status   Specimen Description BLOOD RIGHT ANTECUBITAL  Final   Special Requests   Final    BOTTLES DRAWN AEROBIC AND ANAEROBIC Blood Culture adequate volume   Culture   Final    NO GROWTH < 12 HOURS Performed at [facility], [place] [address]  [place], [place] [address]    Report Status PENDING  Incomplete  Resp Panel by RT-PCR (Flu A&B, Covid) Nasopharyngeal Swab     Status: None   Collection Time: 04/08/21 10:01 PM   Specimen: Nasopharyngeal Swab; Nasopharyngeal(NP) swabs in vial transport medium  Result Value Ref Range Status   SARS Coronavirus 2 by RT PCR NEGATIVE NEGATIVE Final    Comment: (NOTE) SARS-CoV-2 target nucleic acids are NOT DETECTED.  The SARS-CoV-2 RNA is generally detectable in upper respiratory specimens during the acute phase of infection. The lowest concentration of SARS-CoV-2 viral copies this assay can detect is 138 copies/mL. A negative result does not preclude SARS-Cov-2 infection and should not be used as the sole basis for treatment or other patient management decisions. A negative result may occur with  improper specimen collection/handling, submission of specimen other than nasopharyngeal swab, presence of viral mutation(s) within the areas targeted by this assay, and inadequate number of viral copies(<138 copies/mL). A negative result must be combined with clinical observations, patient history, and epidemiological information. The expected result is Negative.  Fact Sheet for Patients:  EntrepreneurPulse.com.au  Fact Sheet for Healthcare Providers:  IncredibleEmployment.be  This test is no t yet approved or cleared by the [place] FDA and  has been authorized for detection and/or diagnosis of SARS-CoV-2 by FDA under an Emergency Use Authorization (EUA). This EUA will remain  in effect (meaning this test can be used) for the duration of the COVID-19 declaration under Section 564(b)(1) of the Act, 21 U.S.C.section 360bbb-3(b)(1), unless the authorization is terminated  or revoked sooner.       Influenza A by PCR NEGATIVE NEGATIVE Final   Influenza B by PCR NEGATIVE NEGATIVE Final    Comment: (NOTE) The Xpert Xpress SARS-CoV-2/FLU/RSV plus assay is  intended as an aid in the diagnosis of influenza from Nasopharyngeal swab specimens and should not be used as a sole basis for treatment. Nasal washings and aspirates are unacceptable for Xpert Xpress SARS-CoV-2/FLU/RSV testing.  Fact Sheet for Patients: EntrepreneurPulse.com.au  Fact Sheet for Healthcare Providers: IncredibleEmployment.be  This test is not yet approved or cleared by the [place] FDA and has been authorized for detection and/or diagnosis of SARS-CoV-2 by FDA under an Emergency Use Authorization (EUA). This EUA will remain in effect (meaning this test can be used) for the duration of the COVID-19 declaration under Section 564(b)(1) of the Act, 21 U.S.C. section 360bbb-3(b)(1), unless the authorization is terminated or revoked.  Performed at [facility], [place] [address] [place], [place] [address]      Radiology Studies: DG Chest 2 View  Result Date: 04/08/2021 CLINICAL DATA:  Altered mental status and dizziness. EXAM: CHEST - 2 VIEW COMPARISON:  March 19, 2020 FINDINGS: Hazy, moderate severity infiltrate is seen within the right upper lobe. There is no evidence of a pleural effusion or pneumothorax. The heart size and mediastinal contours are  within normal limits. The visualized skeletal structures are unremarkable. IMPRESSION: Moderate severity right upper lobe infiltrate. Electronically Signed   By: Virgina Norfolk M.D.   On: 04/08/2021 19:22   CT HEAD WO CONTRAST (5MM)  Result Date: 04/08/2021 CLINICAL DATA:  Dizziness EXAM: CT HEAD WITHOUT CONTRAST TECHNIQUE: Contiguous axial images were obtained from the base of the skull through the vertex without intravenous contrast. COMPARISON:  None. FINDINGS: Brain: No evidence of acute infarction, hemorrhage, hydrocephalus, extra-axial collection or mass lesion/mass effect. Vascular: No hyperdense vessel or unexpected calcification. Skull: Normal. Negative for fracture or  focal lesion. Sinuses/Orbits: No acute finding. Other: None. IMPRESSION: No acute abnormality noted. Electronically Signed   By: Inez Catalina M.D.   On: 04/08/2021 21:39     LOS: 2 days   [name], MD [facility]  04/10/2021, 9:18 AM

## 2021-04-11 ENCOUNTER — Emergency Department (HOSPITAL_COMMUNITY): Payer: Medicaid Other

## 2021-04-11 ENCOUNTER — Encounter (HOSPITAL_COMMUNITY): Payer: Self-pay

## 2021-04-11 ENCOUNTER — Emergency Department (HOSPITAL_COMMUNITY)
Admission: EM | Admit: 2021-04-11 | Discharge: 2021-04-11 | Disposition: A | Discharge status: Home / Self Care | Payer: Medicaid Other | Attending: Emergency Medicine | Admitting: Emergency Medicine

## 2021-04-11 ENCOUNTER — Other Ambulatory Visit: Payer: Self-pay

## 2021-04-11 DIAGNOSIS — M06041 Rheumatoid arthritis without rheumatoid factor, right hand: Secondary | ICD-10-CM | POA: Insufficient documentation

## 2021-04-11 DIAGNOSIS — J181 Lobar pneumonia, unspecified organism: Secondary | ICD-10-CM | POA: Insufficient documentation

## 2021-04-11 DIAGNOSIS — J189 Pneumonia, unspecified organism: Secondary | ICD-10-CM

## 2021-04-11 DIAGNOSIS — R630 Anorexia: Secondary | ICD-10-CM | POA: Insufficient documentation

## 2021-04-11 DIAGNOSIS — D649 Anemia, unspecified: Secondary | ICD-10-CM

## 2021-04-11 DIAGNOSIS — M06042 Rheumatoid arthritis without rheumatoid factor, left hand: Secondary | ICD-10-CM | POA: Insufficient documentation

## 2021-04-11 DIAGNOSIS — M069 Rheumatoid arthritis, unspecified: Secondary | ICD-10-CM

## 2021-04-11 DIAGNOSIS — J0100 Acute maxillary sinusitis, unspecified: Secondary | ICD-10-CM

## 2021-04-11 DIAGNOSIS — J45909 Unspecified asthma, uncomplicated: Secondary | ICD-10-CM | POA: Insufficient documentation

## 2021-04-11 DIAGNOSIS — F17210 Nicotine dependence, cigarettes, uncomplicated: Secondary | ICD-10-CM | POA: Insufficient documentation

## 2021-04-11 DIAGNOSIS — Z20822 Contact with and (suspected) exposure to covid-19: Secondary | ICD-10-CM | POA: Insufficient documentation

## 2021-04-11 DIAGNOSIS — Z8616 Personal history of COVID-19: Secondary | ICD-10-CM | POA: Insufficient documentation

## 2021-04-11 DIAGNOSIS — E876 Hypokalemia: Secondary | ICD-10-CM

## 2021-04-11 LAB — COMPREHENSIVE METABOLIC PANEL
ALT: 91 U/L — ABNORMAL HIGH (ref 0–44)
AST: 41 U/L (ref 15–41)
Albumin: 2.3 g/dL — ABNORMAL LOW (ref 3.5–5.0)
Alkaline Phosphatase: 58 U/L (ref 38–126)
Anion gap: 11 (ref 5–15)
BUN: 8 mg/dL (ref 6–20)
CO2: 27 mmol/L (ref 22–32)
Calcium: 8.2 mg/dL — ABNORMAL LOW (ref 8.9–10.3)
Chloride: 101 mmol/L (ref 98–111)
Creatinine, Ser: 0.55 mg/dL (ref 0.44–1.00)
GFR, Estimated: >60 mL/min (ref >60)
Glucose, Bld: 100 mg/dL — ABNORMAL HIGH (ref 70–99)
Potassium: 3 mmol/L — ABNORMAL LOW (ref 3.5–5.1)
Sodium: 139 mmol/L (ref 135–145)
Total Bilirubin: 0.5 mg/dL (ref 0.3–1.2)
Total Protein: 8.6 g/dL — ABNORMAL HIGH (ref 6.5–8.1)

## 2021-04-11 LAB — LEGIONELLA PNEUMOPHILA SEROGP 1 UR AG: L. pneumophila Serogp 1 Ur Ag: NEGATIVE

## 2021-04-11 LAB — URINE CULTURE

## 2021-04-11 LAB — CBC WITH DIFFERENTIAL/PLATELET
Abs Immature Granulocytes: 0.24 10*3/uL — ABNORMAL HIGH (ref 0.00–0.07)
Basophils Absolute: 0.1 10*3/uL (ref 0.0–0.1)
Basophils Relative: 0 %
Eosinophils Absolute: 0.2 10*3/uL (ref 0.0–0.5)
Eosinophils Relative: 2 %
HCT: 28.7 % — ABNORMAL LOW (ref 36.0–46.0)
Hemoglobin: 9.1 g/dL — ABNORMAL LOW (ref 12.0–15.0)
Immature Granulocytes: 2 %
Lymphocytes Relative: 37 %
Lymphs Abs: 5.9 10*3/uL — ABNORMAL HIGH (ref 0.7–4.0)
MCH: 27.2 pg (ref 26.0–34.0)
MCHC: 31.7 g/dL (ref 30.0–36.0)
MCV: 85.7 fL (ref 80.0–100.0)
Monocytes Absolute: 1.2 10*3/uL — ABNORMAL HIGH (ref 0.1–1.0)
Monocytes Relative: 8 %
Neutro Abs: 8.2 10*3/uL — ABNORMAL HIGH (ref 1.7–7.7)
Neutrophils Relative %: 51 %
Platelets: 285 10*3/uL (ref 150–400)
RBC: 3.35 MIL/uL — ABNORMAL LOW (ref 3.87–5.11)
RDW: 17.6 % — ABNORMAL HIGH (ref 11.5–15.5)
WBC: 15.8 10*3/uL — ABNORMAL HIGH (ref 4.0–10.5)
nRBC: 0 % (ref 0.0–0.2)

## 2021-04-11 LAB — RESP PANEL BY RT-PCR (FLU A&B, COVID) ARPGX2
Influenza A by PCR: NEGATIVE
Influenza B by PCR: NEGATIVE
SARS Coronavirus 2 by RT PCR: NEGATIVE

## 2021-04-11 MED ORDER — BENZONATATE 100 MG PO CAPS
100.0000 mg | ORAL_CAPSULE | Freq: Three times a day (TID) | ORAL | 0 refills | Status: DC
Start: 2021-04-11 — End: 2024-04-25

## 2021-04-11 MED ORDER — SODIUM CHLORIDE 0.9 % IV BOLUS
1000.0000 mL | Freq: Once | INTRAVENOUS | Status: AC
  Administered 2021-04-11: 1000 mL via INTRAVENOUS

## 2021-04-11 MED ORDER — IOHEXOL 350 MG/ML SOLN
80.0000 mL | Freq: Once | INTRAVENOUS | Status: AC | PRN
  Administered 2021-04-11: 80 mL via INTRAVENOUS

## 2021-04-11 MED ORDER — ARTIFICIAL TEARS OPHTHALMIC OINT
TOPICAL_OINTMENT | Freq: Once | OPHTHALMIC | Status: AC
  Administered 2021-04-11: 1 via OPHTHALMIC
  Filled 2021-04-11: qty 3.5

## 2021-04-11 MED ORDER — POTASSIUM CHLORIDE 10 MEQ/100ML IV SOLN
10.0000 meq | INTRAVENOUS | Status: AC
  Administered 2021-04-11 (×2): 10 meq via INTRAVENOUS
  Filled 2021-04-11 (×2): qty 100

## 2021-04-11 MED ORDER — LEVOFLOXACIN 750 MG PO TABS
750.0000 mg | ORAL_TABLET | Freq: Once | ORAL | Status: AC
  Administered 2021-04-11: 750 mg via ORAL
  Filled 2021-04-11: qty 1

## 2021-04-11 MED ORDER — KETOROLAC TROMETHAMINE 15 MG/ML IJ SOLN
15.0000 mg | Freq: Once | INTRAMUSCULAR | Status: DC
  Filled 2021-04-11: qty 1

## 2021-04-11 MED ORDER — HYDROCODONE BIT-HOMATROP MBR 5-1.5 MG/5ML PO SOLN
5.0000 mL | Freq: Once | ORAL | Status: AC
  Administered 2021-04-11: 5 mL via ORAL
  Filled 2021-04-11: qty 5

## 2021-04-11 MED ORDER — LEVOFLOXACIN 750 MG PO TABS: 750.0000 mg | ORAL_TABLET | Freq: Every day | ORAL | 0 refills | Status: AC

## 2021-04-11 NOTE — ED Provider Notes (Signed)
[facility]-EMERGENCY DEPT Provider Note   CSN: [id number] Arrival date & time: 04/11/21  1255     History Chief Complaint  Patient presents with  . Abnormal Lab    Sheri Hall is a 50 y.o. female.  Sheri Hall was admitted to [facility] on 04/08/2021.  She had right upper lobe pneumonia, sepsis, and encephalopathy.  She was treated with Rocephin and azithromycin, and she left the hospital AGAINST MEDICAL ADVICE early this morning.  She states that she was not discharged with antibiotics.  She states that she was unable to rest, felt that the environment was not therapeutic, and she felt that she had to get out of the hospital.  However, she endorses weakness, difficulty walking, and shortness of breath.  She has had an ongoing cough.  She also reports that she has had a very bad smell in her nose for days.  She initially thought this was specific to [facility], but after going home, she washed everything, and she still smells it.  During her hospitalization at [facility], she was experiencing epistaxis. She had a CT of her head.  The history is provided by the patient.  Weakness Severity:  Moderate Onset quality:  Gradual Duration:  1 week Timing:  Constant Progression:  Waxing and waning Chronicity:  New Context comment:  Recent hospitalization for RUL pneumonia Relieved by:  Nothing Worsened by:  Nothing Ineffective treatments:  None tried Associated symptoms: anorexia, cough, dizziness, fever (none today), nausea and shortness of breath   Associated symptoms: no abdominal pain, no arthralgias, no chest pain, no diarrhea, no dysuria, no seizures and no vomiting       Past Medical History:  Diagnosis Date  . Anxiety   . Arthritis    rheumatoid   . Asthma    seasonal  . Bipolar disorder (HCC)   . Depression   . GERD (gastroesophageal reflux disease)   . OCD (obsessive compulsive disorder)   . PTSD (post-traumatic stress disorder)      Patient Active Problem List   Diagnosis Date Noted  . Hyponatremia 04/09/2021  . AKI (acute kidney injury) (HCC) 04/09/2021  . Acute metabolic encephalopathy 04/09/2021  . CAP (community acquired pneumonia) 04/08/2021  . Dehydration 03/19/2020  . COVID-19 virus infection 03/19/2020  . Bipolar I disorder, most recent episode mixed (HCC) 12/29/2019  . Major depressive disorder, recurrent episode (HCC) 12/28/2019  . Lobar pneumonia (HCC) 07/17/2018  . Normocytic anemia 07/17/2018  . Cocaine use 07/17/2018  . Rheumatoid arthritis (HCC) 07/17/2018  . Sepsis (HCC) 07/16/2018  . PTSD (post-traumatic stress disorder) 09/17/2017  . Substance use disorder 09/17/2017  . Grief 09/17/2017  . MDD (major depressive disorder), severe (HCC) 09/16/2017    Past Surgical History:  Procedure Laterality Date  . CESAREAN SECTION    . OPEN REDUCTION INTERNAL FIXATION (ORIF) DISTAL RADIAL FRACTURE Left 10/15/2015   Procedure: OPEN TREATMENT OF LEFT DISTAL RADIUS FRACTURE;  Surgeon: Mack Hook, MD;  Location: [facility];  Service: Orthopedics;  Laterality: Left;     OB History   No obstetric history on file.     Family History  Problem Relation Age of Onset  . Healthy Mother   . Healthy Father     Social History   Tobacco Use  . Smoking status: Every Day    Packs/day: 0.50    Types: Cigarettes  . Smokeless tobacco: Never  Vaping Use  . Vaping Use: Never used  Substance Use Topics  .  Alcohol use: Yes    Comment: occ; liquor  . Drug use: Yes    Types: Cocaine, "Crack" cocaine    Comment: wednesday March 22nd 2017    Home Medications Prior to Admission medications   Medication Sig Start Date End Date Taking? Authorizing Provider  Adalimumab 40 MG/0.4ML PNKT Inject 0.4 mLs into the skin every 14 (fourteen) days. 01/11/21   [provider]  albuterol (VENTOLIN HFA) 108 (90 Base) MCG/ACT inhaler Inhale 2 puffs into the lungs every 6 (six) hours as needed  for wheezing or shortness of breath. 03/26/20   [place], Alvira Philips, DO  chlorhexidine (PERIDEX) 0.12 % solution Use as directed 15 mLs in the mouth or throat daily. 03/21/21   [provider]  DENTA 5000 PLUS 1.1 % CREA dental cream Place 1 application onto teeth daily. 03/21/21   [provider]  folic acid (FOLVITE) 1 MG tablet Take 1 mg by mouth daily. 01/11/21   [provider]  guaiFENesin-dextromethorphan (ROBITUSSIN DM) 100-10 MG/5ML syrup Take 10 mLs by mouth every 4 (four) hours as needed for cough. Patient not taking: No sig reported 03/26/20   [place], Alvira Philips, DO  ibuprofen (ADVIL) 600 MG tablet Take 1 tablet (600 mg total) by mouth every 6 (six) hours as needed. Patient not taking: Reported on 04/09/2021 03/05/20   Moshe Cipro, NP  ipratropium (ATROVENT HFA) 17 MCG/ACT inhaler Inhale 2 puffs into the lungs every 6 (six) hours as needed for wheezing. 03/26/20   [place], Alvira Philips, DO  methotrexate (RHEUMATREX) 2.5 MG tablet Take 15 mg by mouth once a week. 01/11/21   [provider]  valACYclovir (VALTREX) 1000 MG tablet Take 1,000 mg by mouth 2 (two) times daily as needed (breakouts). 03/21/21   [provider]    Allergies    Patient has no known allergies.  Review of Systems   Review of Systems  Constitutional:  Positive for fever (none today). Negative for chills.  HENT:  Negative for ear pain and sore throat.   Eyes:  Negative for pain and visual disturbance.  Respiratory:  Positive for cough and shortness of breath.   Cardiovascular:  Negative for chest pain and palpitations.  Gastrointestinal:  Positive for anorexia and nausea. Negative for abdominal pain, diarrhea and vomiting.  Genitourinary:  Negative for dysuria and hematuria.  Musculoskeletal:  Negative for arthralgias and back pain.  Skin:  Negative for color change and rash.  Neurological:  Positive for dizziness and weakness. Negative for seizures and syncope.  All other systems  reviewed and are negative.  Physical Exam Updated Vital Signs BP 97/66 (BP Location: Left Arm)   Pulse 85   Temp 98.5 F (36.9 C) (Oral)   Resp 18   SpO2 98%   Physical Exam Vitals and nursing note reviewed.  Constitutional:      Appearance: She is well-developed.  HENT:     Head: Normocephalic and atraumatic.  Cardiovascular:     Rate and Rhythm: Normal rate and regular rhythm.     Heart sounds: Normal heart sounds.  Pulmonary:     Effort: Pulmonary effort is normal. No tachypnea.     Breath sounds: Examination of the left-upper field reveals rhonchi. Examination of the left-middle field reveals rhonchi. Rhonchi present.  Musculoskeletal:     Right lower leg: No edema.     Left lower leg: No edema.  Skin:    General: Skin is warm and dry.  Neurological:     General: No focal  deficit present.     Mental Status: She is alert and oriented to person, place, and time.  Psychiatric:        Mood and Affect: Mood normal.        Behavior: Behavior normal.    ED Results / Procedures / Treatments   Labs (all labs ordered are listed, but only abnormal results are displayed) Labs Reviewed  COMPREHENSIVE METABOLIC PANEL - Abnormal; Notable for the following components:      Result Value   Potassium 3.0 (*)    Glucose, Bld 100 (*)    Calcium 8.2 (*)    Total Protein 8.6 (*)    Albumin 2.3 (*)    ALT 91 (*)    All other components within normal limits  CBC WITH DIFFERENTIAL/PLATELET - Abnormal; Notable for the following components:   WBC 15.8 (*)    RBC 3.35 (*)    Hemoglobin 9.1 (*)    HCT 28.7 (*)    RDW 17.6 (*)    Neutro Abs 8.2 (*)    Lymphs Abs 5.9 (*)    Monocytes Absolute 1.2 (*)    Abs Immature Granulocytes 0.24 (*)    All other components within normal limits  RESP PANEL BY RT-PCR (FLU A&B, COVID) [id number]    EKG None  Radiology DG Chest 2 View  Result Date: 04/11/2021 CLINICAL DATA:  Hyponatremia, hypokalemia and sepsis secondary to pneumonia. EXAM:  CHEST - 2 VIEW COMPARISON:  April 08, 2021 FINDINGS: Moderate severity infiltrate is seen within the superior segment of the right lower lobe. This is very mildly increased in severity when compared to the prior study. There is no evidence of a pleural effusion or pneumothorax. The heart size and mediastinal contours are within normal limits. The visualized skeletal structures are unremarkable. IMPRESSION: Moderate severity right lower lobe infiltrate. Electronically Signed   By: Aram Candela M.D.   On: 04/11/2021 19:29   CT Maxillofacial W Contrast  Result Date: 04/11/2021 CLINICAL DATA:  Initial evaluation for maxillary/facial abscess. EXAM: CT MAXILLOFACIAL WITH CONTRAST TECHNIQUE: Multidetector CT imaging of the maxillofacial structures was performed with intravenous contrast. Multiplanar CT image reconstructions were also generated. CONTRAST:  34mL OMNIPAQUE IOHEXOL 350 MG/ML SOLN COMPARISON:  None. FINDINGS: Osseous: No acute osseous abnormality. No discrete or worrisome osseous lesions. Remote posttraumatic defect noted at the left lamina papyracea. Degenerative osteoarthritic changes noted about the TMJs bilaterally. Note made of a focal dental carie at the right first mandibular molar. Orbits: Globes and orbital soft tissues within normal limits. No evidence for intraorbital or postseptal cellulitis. Sinuses: Scattered mucoperiosteal thickening present about the sphenoethmoidal and maxillary sinuses, likely allergic/inflammatory nature. No air-fluid levels to suggest acute sinusitis. Small chronic appearing right mastoid effusion noted. Visualized nasopharynx unremarkable. Middle ear cavities are clear. Soft tissues: There is question of a small area of mild tissue stranding involving the subcutaneous fat of the left face, lateral to the left mandibular body (series 3, image 56). Changes are extremely mild in nature, and not entirely certain, but could reflect early and/or mild  infection/cellulitis. No other significant soft tissue swelling or inflammatory changes seen elsewhere about the face. No discrete abscess or drainable fluid collection. Salivary glands including the parotid and submandibular glands are within normal limits. Partially visualized thyroid within normal limits. No concerning adenopathy within the visualized upper neck. Limited intracranial: Unremarkable. IMPRESSION: 1. Question small area of mild tissue stranding involving the subcutaneous fat of the left face, lateral to the left mandibular body. Changes  are extremely mild in nature, and not entirely certain, but could reflect changes of early and/or mild infection/cellulitis. Correlation with physical exam recommended. No discrete abscess or drainable fluid collection. 2. Mild mucoperiosteal thickening about the sphenoethmoidal and maxillary sinuses, likely allergic/inflammatory in nature. No air-fluid levels to suggest acute sinusitis. 3. Focal dental carie at the right first mandibular molar. 4. Remote posttraumatic defect at the left lamina papyracea. Electronically Signed   By: Rise Mu M.D.   On: 04/11/2021 19:25    Procedures Procedures   Medications Ordered in ED Medications  ketorolac (TORADOL) 15 MG/ML injection 15 mg (15 mg Intravenous Not Given 04/11/21 1826)  potassium chloride 10 mEq in 100 mL IVPB (10 mEq Intravenous New Bag/Given 04/11/21 2003)  artificial tears (LACRILUBE) ophthalmic ointment (has no administration in time range)  levofloxacin (LEVAQUIN) tablet 750 mg (has no administration in time range)  HYDROcodone bit-homatropine (HYCODAN) 5-1.5 MG/5ML syrup 5 mL (5 mLs Oral Given 04/11/21 1930)  sodium chloride 0.9 % bolus 1,000 mL (0 mLs Intravenous Stopped 04/11/21 1937)  iohexol (OMNIPAQUE) 350 MG/ML injection 80 mL (80 mLs Intravenous Contrast Given 04/11/21 1853)    ED Course  I have reviewed the triage vital signs and the nursing notes.  Pertinent labs & imaging  results that were available during my care of the patient were reviewed by me and considered in my medical decision making (see chart for details).  Clinical Course as of 04/11/21 2019  Thu Apr 11, 2021  2016 I spoke with Dr. Rachael Darby regarding this patient.  No clear indication for hospitalization given her lack of oxygen requirement or other indication.  Patient is comfortable going home.  I will treat her with Levaquin after discussing the choice of antibiotics with Dr. Rachael Darby.  She has maxillary sinusitis and appears to be symptomatic from it based on her complaints of a foul odor.  This will cover lungs and sinuses.   [AW]    Clinical Course User Index [AW] Koleen Distance, MD   MDM Rules/Calculators/A&P                           Sheri Hall presented after leaving AGAINST MEDICAL ADVICE from [facility] earlier this morning.  She had been being treated for pneumonia and sepsis.  She is currently well-appearing with normal vital signs and no O2 requirement.  She was evaluated here and was noted to have a slight increase in her white blood cell count.  Hemoglobin is low but at baseline.  Electrolytes are largely corrected when compared to her admission labs, but she was slightly hypokalemic.  This was repleted here in the ED.  She will be treated for sinusitis and pneumonia.  Careful return precautions were given. Final Clinical Impression(s) / ED Diagnoses Final diagnoses:  Community acquired pneumonia of right lower lobe of lung  Acute non-recurrent maxillary sinusitis  Anemia, unspecified type  Rheumatoid arthritis involving both hands, unspecified whether rheumatoid factor present (HCC)  Hypokalemia    Rx / DC Orders ED Discharge Orders          Ordered    levofloxacin (LEVAQUIN) 750 MG tablet  Daily        04/11/21 2015    benzonatate (TESSALON) 100 MG capsule  Every 8 hours        04/11/21 2016             Koleen Distance, MD 04/11/21 2019

## 2021-04-11 NOTE — ED Notes (Signed)
Provider at the bedside.  

## 2021-04-11 NOTE — Discharge Summary (Signed)
Sheri Hall DISCHARGE SUMMARY  Patient expresses desire to leave the Hospital immidiately during night RN and MD has been warned that this is not Medically advisable at this time, and can result in Medical complications like Death and Disability, patient understands and accepts the risks involved and assumes full responsibilty of this decision. I made my best effort to convince him to stay.  Sheri Hall M.D on 04/11/2021 at 12:58 PM  [facility]  Time < 30 minutes  Last Note Below  from 04/10/21 Brief Narrative/Hospital Course: 50 year old female with asthma, arthritis anxiety/bipolar/depression/OCD/PTSD, GERD seen in the ED for altered mental status, poor oral intake, cough In the ED alert oriented x3 but slow to respond, vitals showed fever tachycardia tachypnea, leukocytosis, hyponatremia hypokalemia with abnormal LFTs, normal lactic acid UA, UDS pending alcohol level undetectable ammonia level normal chest x-ray consistent with right upper lobe infiltrate CT head no acute finding COVID-19 negative.  Procal up 12, Patient was given high-volume bolus 30 cc/kg, IV antibiotics and blood cultures were sent and admitted for further management.   Assessment & Plan: Severe sepsis due to CAP  CAP bacterial: Severe sepsis criteria met with fever, RR 22/m HR 124, WBC 17K- source CAP, organ damage w/ encephalopathy.Continue ceftriaxone/azithromycin, follow-up blood culture.  Encourage ambulation incentive spirometry  Acute metabolic encephalopathy due to sepsis-resolved.   Dizziness, check orthostatic vitals discussed with nurse Hyponatremia with poor oral intake.  Improved, will stop IV fluids Recent Labs  Lab 04/08/21 1832 04/08/21 1904 04/09/21 0805 04/09/21 1200 04/10/21 0149  NA 125* 128* 129* 130* 135    AKI-resolved.  Stop IV  fluids Hypokalemia-resolved  Epistaxis Coughing up blood/Hymoptysis?: likely epistaxis.consulted ENT and discussed with Dr. Wilburn Cornelia advised Afrin nasal spray, saline nasal spray, not to blow the nose if recurrent may need packing, patient can be followed up as outpatient if no recurrence.  Discussed with PCCM.  Seems to have resolved.  Change afrin to prn  Mild transaminitis-likely from sepsis monitor.  Holding methotrexate for now  Hx of RA- on humira every 2 week and methotrexate q. weekly.  Monitor LFTs before resuming methotrexate due for Humira next wk  Anemia likely from chronic disease check anemia panel.  Continue her folate  Asthma-monitor Mood disorder w/ anxiety/bipolar/depression/OCD/PTSD: Mood is stable.    Diet Order     None     DVT prophylaxis:  Code Status:   Code Status: Prior  Family Communication: plan of care discussed with patient at bedside.   Objective: Vitals: Today's Vitals   04/10/21 1230 04/10/21 2036 04/10/21 2211 04/11/21 0045  BP: 101/68 (!) 85/52    Pulse: 77 88    Resp: 16 16    Temp:  98.7 F (37.1 C)    TempSrc:  Axillary    SpO2: 97% 97%    Weight:      Height:      PainSc:   0-No pain 0-No pain   Examination:  General exam: AA0X3,older than stated age HEENT:Oral mucosa moist, Ear/Nose WNL grossly,dentition normal. Respiratory system: bilaterally clear no use of accessory muscle, non tender. Cardiovascular system: S1 & S2 +,No JVD. Gastrointestinal system: Abdomen soft, NT,ND, BS+. Nervous System:Alert, awake,  moving extremities Extremities: No edema, distal peripheral pulses palpable.  Skin: No rashes,no icterus. MSK: Normal muscle bulk,tone, power  No intake or output data in the 24 hours ending 04/11/21 1259  Filed Weights   04/10/21 0600  Weight: 66.8 kg   Weight change:    Consultants:see note  Procedures:see note Antimicrobials: Anti-infectives (From admission, onward)    Start     Dose/Rate Route Frequency  Ordered Stop   04/08/21 2030  cefTRIAXone (ROCEPHIN) 2 g in sodium chloride 0.9 % 100 mL IVPB  Status:  Discontinued        2 g 200 mL/hr over 30 Minutes Intravenous Every 24 hours 04/08/21 2028 04/11/21 1159   04/08/21 2030  azithromycin (ZITHROMAX) 500 mg in sodium chloride 0.9 % 250 mL IVPB  Status:  Discontinued        500 mg 250 mL/hr over 60 Minutes Intravenous Every 24 hours 04/08/21 2028 04/11/21 1159      Culture/Microbiology    Component Value Date/Time   SDES BLOOD RIGHT ANTECUBITAL 04/08/2021 2109   SPECREQUEST  04/08/2021 2109    BOTTLES DRAWN AEROBIC AND ANAEROBIC Blood Culture adequate volume   CULT  04/08/2021 2109    NO GROWTH 3 DAYS Performed at [facility] [address], [place], [place] [address]    REPTSTATUS PENDING 04/08/2021 2109    Other culture-see note  Unresulted Labs (From admission, onward)     Start     Ordered   04/08/21 2010  Legionella Pneumophila Serogp 1 Ur Ag  Once,   STAT        04/08/21 2009           Medications reviewed: \ Scheduled Meds: REM  Continuous Infusions: REM    Intake/Output from previous day: No intake/output data recorded. Intake/Output this shift: No intake/output data recorded. Filed Weights   04/10/21 0600  Weight: 66.8 kg   Data Reviewed: I have personally reviewed following labs and imaging studies CBC: Recent Labs  Lab 04/08/21 1832 04/08/21 1904 04/09/21 0805 04/09/21 1715 04/09/21 1953 04/10/21 0149  WBC 17.0*  --  13.7*  --   --  12.8*  NEUTROABS 11.9*  --   --   --   --   --   HGB 10.2* 10.9* 9.7* 8.7* 9.3* 8.6*  HCT 30.2* 32.0* 29.1* 27.0* 27.9* 26.2*  MCV 83.9  --  85.6  --   --  84.5  PLT 174  --  157  --   --  811   Basic Metabolic Panel: Recent Labs  Lab 04/08/21 1832 04/08/21 1904 04/09/21 0805 04/09/21 1200 04/10/21 0149  NA 125* 128* 129* 130* 135  K 3.3* 3.4* 3.3* 3.6 3.6  CL 91* 93* 97* 97* 103  CO2 23  --  _0 GLUCOSE 114* 118* 103* 105* 97  BUN  21* 21* _1 CREATININE 1.02* 0.90 0.89 0.86 0.67  CALCIUM 7.9*  --  7.6* 7.6* 7.5*  MG  --   --  1.9  --   --    GFR: Estimated Creatinine Clearance: 73.6 mL/min (by C-G formula based on SCr of 0.67 mg/dL). Liver Function Tests: Recent Labs  Lab 04/08/21 1832 04/09/21 0805  AST 218* 130*  ALT 261* 174*  ALKPHOS 57 44  BILITOT 1.1 0.6  PROT 8.5* 7.7  ALBUMIN 2.2* 1.8*   Recent Labs  Lab 04/08/21 1832  LIPASE 61*   Recent Labs  Lab 04/08/21 1836  AMMONIA  34   Coagulation Profile: Recent Labs  Lab 04/08/21 2110 04/09/21 1200  INR 1.2 1.3*   Cardiac Enzymes: No results for input(s): CKTOTAL, CKMB, CKMBINDEX, TROPONINI in the last 168 hours. BNP (last 3 results) No results for input(s): PROBNP in the last 8760 hours. HbA1C: No results for input(s): HGBA1C in the last 72 hours. CBG: Recent Labs  Lab 04/08/21 1837  GLUCAP 118*   Lipid Profile: No results for input(s): CHOL, HDL, LDLCALC, TRIG, CHOLHDL, LDLDIRECT in the last 72 hours. Thyroid Function Tests: Recent Labs    04/09/21 0805  TSH 0.088*   Anemia Panel: Recent Labs    04/09/21 0805  VITAMINB12 566   Sepsis Labs: Recent Labs  Lab 04/08/21 1832 04/08/21 2110  PROCALCITON  --  12.14  LATICACIDVEN 1.1 1.9    Recent Results (from the past 240 hour(s))  Urine Culture     Status: Abnormal   Collection Time: 04/08/21  8:27 PM   Specimen: In/Out Cath Urine  Result Value Ref Range Status   Specimen Description IN/OUT CATH URINE  Final   Special Requests   Final    NONE Performed at [facility], [place] [address], [place], [place] [address]    Culture MULTIPLE SPECIES PRESENT, SUGGEST RECOLLECTION (A)  Final   Report Status 04/11/2021 FINAL  Final  Blood culture (routine x 2)     Status: None (Preliminary result)   Collection Time: 04/08/21  9:00 PM   Specimen: BLOOD  Result Value Ref Range Status   Specimen Description BLOOD LEFT ANTECUBITAL  Final   Special Requests   Final     BOTTLES DRAWN AEROBIC AND ANAEROBIC Blood Culture adequate volume   Culture   Final    NO GROWTH 3 DAYS Performed at [facility], [place] [address] [place], [place] [address]    Report Status PENDING  Incomplete  Blood culture (routine x 2)     Status: None (Preliminary result)   Collection Time: 04/08/21  9:09 PM   Specimen: BLOOD  Result Value Ref Range Status   Specimen Description BLOOD RIGHT ANTECUBITAL  Final   Special Requests   Final    BOTTLES DRAWN AEROBIC AND ANAEROBIC Blood Culture adequate volume   Culture   Final    NO GROWTH 3 DAYS Performed at [facility], [place] [address], [place], [place] [address]    Report Status PENDING  Incomplete  Resp Panel by RT-PCR (Flu A&B, Covid) Nasopharyngeal Swab     Status: None   Collection Time: 04/08/21 10:01 PM   Specimen: Nasopharyngeal Swab; Nasopharyngeal(NP) swabs in vial transport medium  Result Value Ref Range Status   SARS Coronavirus 2 by RT PCR NEGATIVE NEGATIVE Final    Comment: (NOTE) SARS-CoV-2 target nucleic acids are NOT DETECTED.  The SARS-CoV-2 RNA is generally detectable in upper respiratory specimens during the acute phase of infection. The lowest concentration of SARS-CoV-2 viral copies this assay can detect is 138 copies/mL. A negative result does not preclude SARS-Cov-2 infection and should not be used as the sole basis for treatment or other patient management decisions. A negative result may occur with  improper specimen collection/handling, submission of specimen other than nasopharyngeal swab, presence of viral mutation(s) within the areas targeted by this assay, and inadequate number of viral copies(<138 copies/mL). A negative result must be combined with clinical observations, patient history, and epidemiological information. The expected result is Negative.  Fact Sheet for Patients:  EntrepreneurPulse.com.au  Fact Sheet  for Healthcare Providers:   IncredibleEmployment.be  This test is no t yet approved or cleared by the [place] and  has been authorized for detection and/or diagnosis of SARS-CoV-2 by FDA under an Emergency Use Authorization (EUA). This EUA will remain  in effect (meaning this test can be used) for the duration of the COVID-19 declaration under Section 564(b)(1) of the Act, 21 U.S.C.section 360bbb-3(b)(1), unless the authorization is terminated  or revoked sooner.       Influenza A by PCR NEGATIVE NEGATIVE Final   Influenza B by PCR NEGATIVE NEGATIVE Final    Comment: (NOTE) The Xpert Xpress SARS-CoV-2/FLU/RSV plus assay is intended as an aid in the diagnosis of influenza from Nasopharyngeal swab specimens and should not be used as a sole basis for treatment. Nasal washings and aspirates are unacceptable for Xpert Xpress SARS-CoV-2/FLU/RSV testing.  Fact Sheet for Patients: EntrepreneurPulse.com.au  Fact Sheet for Healthcare Providers: IncredibleEmployment.be  This test is not yet approved or cleared by the [place] FDA and has been authorized for detection and/or diagnosis of SARS-CoV-2 by FDA under an Emergency Use Authorization (EUA). This EUA will remain in effect (meaning this test can be used) for the duration of the COVID-19 declaration under Section 564(b)(1) of the Act, 21 U.S.C. section 360bbb-3(b)(1), unless the authorization is terminated or revoked.  Performed at [facility], [place] [address] [place], [place] [address]      Radiology Studies: No results found.   LOS: 3 days   Sheri Pert, MD [facility]  04/11/2021, 12:59 PM

## 2021-04-11 NOTE — ED Notes (Signed)
Pt ambulatory to bathroom w/ stand-by assist. Pt has slight wobbly gait.

## 2021-04-11 NOTE — Progress Notes (Signed)
Patient has requested to leave AMA. She is re-educated on the importance of adhering to the prescribed med regimen given her diagnosis. On-call hospitalist Shaloub notified. AMA documentation provided per patient chart.

## 2021-04-11 NOTE — ED Notes (Signed)
Hycodan cough syrup handed-off to nightshift RN, Felipa Furnace.

## 2021-04-11 NOTE — Progress Notes (Signed)
HOSPITAL MEDICINE OVERNIGHT EVENT NOTE    Notified by nursing that patient wished to leave the hospital against medical advice stating that she needed to go home with her kids.  Patient voiced her understanding of her risk of morbidity and mortality by leaving AMA.  Patient signed all appropriate paperwork and left the floor.    Marinda Elk  MD [facility]

## 2021-04-11 NOTE — Plan of Care (Signed)

## 2021-04-11 NOTE — Progress Notes (Signed)
Patient found to be in bed, A&Ox4. NAD. Refused all scheduled mediations. Documented as appropriate.

## 2021-04-11 NOTE — ED Provider Notes (Signed)
Emergency Medicine Provider Triage Evaluation Note  Sheri Hall , a 50 y.o. female  was evaluated in triage.  Presenting to the emergency department with reports of abnormal lab results.  Per chart review patient was seen on 9/19 was admitted to the hospital for hyponatremia, hypokalemia, and sepsis secondary to pneumonia.  Patient left this morning AGAINST MEDICAL ADVICE.  Is presenting to the emergency department requesting readmission.  Review of Systems  Positive: Headache, cough, Negative: Shortness of breath  Physical Exam  BP 103/71 (BP Location: Left Arm)   Pulse 90   Temp 98.5 F (36.9 C) (Oral)   Resp 16   SpO2 96%  Gen:   Awake, no distress   Resp:  Normal effort  MSK:   Moves extremities without difficulty  Other:    Medical Decision Making  Medically screening exam initiated at 1:39 PM.  Appropriate orders placed.  Sheri Hall was informed that the remainder of the evaluation will be completed by another provider, this initial triage assessment does not replace that evaluation, and the importance of remaining in the ED until their evaluation is complete.  The patient appears stable so that the remainder of the work up may be completed by another provider.      Haskel Schroeder, PA-C 04/11/21 1340    Margarita Grizzle, MD 04/16/21 1241

## 2021-04-11 NOTE — ED Triage Notes (Signed)
Pt reports leaving [facility] AMA after being admitted for abnormal sodium and potassium. Pt denies any new symptoms or concerns. She states she is here to be re-admitted for same.

## 2021-04-13 LAB — CULTURE, BLOOD (ROUTINE X 2)
Culture: NO GROWTH
Culture: NO GROWTH
Special Requests: ADEQUATE
Special Requests: ADEQUATE

## 2021-08-29 ENCOUNTER — Encounter (HOSPITAL_COMMUNITY): Payer: Self-pay

## 2021-08-29 ENCOUNTER — Emergency Department (HOSPITAL_COMMUNITY): Payer: Medicaid Other

## 2021-08-29 ENCOUNTER — Emergency Department (HOSPITAL_COMMUNITY)
Admission: EM | Admit: 2021-08-29 | Discharge: 2021-08-29 | Disposition: A | Discharge status: Home / Self Care | Payer: Medicaid Other | Attending: Emergency Medicine | Admitting: Emergency Medicine

## 2021-08-29 DIAGNOSIS — M25552 Pain in left hip: Secondary | ICD-10-CM | POA: Diagnosis present

## 2021-08-29 DIAGNOSIS — M1612 Unilateral primary osteoarthritis, left hip: Secondary | ICD-10-CM | POA: Diagnosis not present

## 2021-08-29 MED ORDER — KETOROLAC TROMETHAMINE 15 MG/ML IJ SOLN
15.0000 mg | Freq: Once | INTRAMUSCULAR | Status: AC
  Administered 2021-08-29: 15 mg via INTRAMUSCULAR
  Filled 2021-08-29: qty 1

## 2021-08-29 NOTE — ED Provider Triage Note (Signed)
Emergency Medicine Provider Triage Evaluation Note  Sheri Hall , a 51 y.o. female  was evaluated in triage.  Pt complains of left anterior thigh pain of 1 year duration that is worsened over the past month particularly over the past few days.  She endorses occasional giving out of her leg resulting in falls.  She denies fever, chills, back pain, abdominal pain, knee pain.  Patient reports she was referred to orthopedics and evaluated 6 months ago and was told she had hip issue.  She does point to her left hip as where the pain is concentrated.  Review of Systems  Positive: As above Negative: As above  Physical Exam  BP (!) 118/93 (BP Location: Right Arm)    Pulse 73    Temp 98.3 F (36.8 C) (Oral)    Resp 18    LMP 08/29/2021 (Approximate)    SpO2 97%  Gen:   Awake, no distress   Resp:  Normal effort  MSK:   Moves extremities without difficulty.  5/5 strength in bilateral extremities.  Hips without tenderness to palpation.  Knees without tenderness to palpation. Other:    Medical Decision Making  Medically screening exam initiated at 3:13 PM.  Appropriate orders placed.  Linh Sprinkle was informed that the remainder of the evaluation will be completed by another provider, this initial triage assessment does not replace that evaluation, and the importance of remaining in the ED until their evaluation is complete.     Marita Kansas, PA-C 08/29/21 1514

## 2021-08-29 NOTE — Discharge Instructions (Signed)
Your left hip x-ray showed moderate to severe degenerative disease.  Your evaluated for this by your orthopedic group previously.  If you are able to schedule an appointment with the same orthopedic group that would be ideal.  If not I have attached on call orthopedic above for you to give a call and schedule a follow-up appointment.  You received a shot of Toradol in the emergency room.  Continue taking your arthritis strength Tylenol at home.  If you have worsening symptoms please return to the emergency room.

## 2021-08-29 NOTE — ED Provider Notes (Signed)
[facility]-EMERGENCY DEPT Provider Note   CSN: [id number] Arrival date & time: 08/29/21  1439     History  Chief Complaint  Patient presents with   Leg Pain    Maryanne Stachnik is a 51 y.o. female.  51 year old female presents today for evaluation of left lower extremity pain of 1 year duration that has progressively worsened.  She states it worsened quite about 6 months ago and was referred to orthopedics where she was told she had bilateral hip issues.  States since then she has had difficulty occasionally with walking and sensation of her leg giving out underneath with couple falls intermittently.  Denies head trauma or loss of consciousness with these episodes.  Without fever, chills.  Denies injury to her left lower extremity.  Without paresthesias.  Without abdominal pain, back pain.  Denies back trauma.  The history is provided by the patient. No language interpreter was used.      Home Medications Prior to Admission medications   Medication Sig Start Date End Date Taking? Authorizing Provider  Adalimumab 40 MG/0.4ML PNKT Inject 0.4 mLs into the skin every 14 (fourteen) days. 01/11/21   [provider]  albuterol (VENTOLIN HFA) 108 (90 Base) MCG/ACT inhaler Inhale 2 puffs into the lungs every 6 (six) hours as needed for wheezing or shortness of breath. 03/26/20   [place], Eric J, DO  benzonatate (TESSALON) 100 MG capsule Take 1 capsule (100 mg total) by mouth every 8 (eight) hours. 04/11/21   Koleen Distance, MD  chlorhexidine (PERIDEX) 0.12 % solution Use as directed 15 mLs in the mouth or throat daily. 03/21/21   [provider]  DENTA 5000 PLUS 1.1 % CREA dental cream Place 1 application onto teeth daily. 03/21/21   [provider]  folic acid (FOLVITE) 1 MG tablet Take 1 mg by mouth daily. 01/11/21   [provider]  guaiFENesin-dextromethorphan (ROBITUSSIN DM) 100-10 MG/5ML syrup Take 10 mLs by mouth every 4 (four) hours as  needed for cough. Patient not taking: No sig reported 03/26/20   [place], Alvira Philips, DO  ibuprofen (ADVIL) 600 MG tablet Take 1 tablet (600 mg total) by mouth every 6 (six) hours as needed. Patient not taking: Reported on 04/09/2021 03/05/20   Moshe Cipro, NP  ipratropium (ATROVENT HFA) 17 MCG/ACT inhaler Inhale 2 puffs into the lungs every 6 (six) hours as needed for wheezing. 03/26/20   [place], Alvira Philips, DO  methotrexate (RHEUMATREX) 2.5 MG tablet Take 15 mg by mouth once a week. 01/11/21   [provider]  valACYclovir (VALTREX) 1000 MG tablet Take 1,000 mg by mouth 2 (two) times daily as needed (breakouts). 03/21/21   [provider]      Allergies    Patient has no known allergies.    Review of Systems   Review of Systems  Constitutional:  Negative for activity change, chills and fever.  Musculoskeletal:  Positive for arthralgias and myalgias. Negative for gait problem.  Skin:  Negative for wound.  Neurological:  Negative for weakness and light-headedness.  All other systems reviewed and are negative.  Physical Exam Updated Vital Signs BP (!) 118/93 (BP Location: Right Arm)    Pulse 73    Temp 98.3 F (36.8 C) (Oral)    Resp 18    LMP 08/29/2021 (Approximate)    SpO2 97%  Physical Exam Vitals and nursing note reviewed.  Constitutional:      General: She is not in acute distress.  Appearance: Normal appearance. She is not ill-appearing.  HENT:     Head: Normocephalic and atraumatic.     Nose: Nose normal.  Eyes:     Conjunctiva/sclera: Conjunctivae normal.  Pulmonary:     Effort: Pulmonary effort is normal. No respiratory distress.  Musculoskeletal:        General: No deformity.     Comments: Mild tenderness present over left hip.  Quadricep muscle group without tenderness to palpation.  5/5 strength in bilateral lower extremities.  2+ DP pulse present.  Full range of motion present in all joints in bilateral lower extremities.  Sensation intact.  Skin:     Findings: No rash.  Neurological:     Mental Status: She is alert.    ED Results / Procedures / Treatments   Labs (all labs ordered are listed, but only abnormal results are displayed) Labs Reviewed - No data to display  EKG None  Radiology DG Hip Unilat W or Wo Pelvis 2-3 Views Left  Result Date: 08/29/2021 CLINICAL DATA:  Left hip pain EXAM: DG HIP (WITH OR WITHOUT PELVIS) 3V LEFT COMPARISON:  None. FINDINGS: There is no evidence of hip fracture or dislocation. Moderate severe degenerative changes of the left hip. Mild degenerative changes of the right hip. Soft tissues are unremarkable. IMPRESSION: Moderate to severe degenerative changes of the left hip. No acute osseous abnormality. Electronically Signed   By: Allegra LaiLeah  Strickland M.D.   On: 08/29/2021 15:37    Procedures Procedures    Medications Ordered in ED Medications - No data to display  ED Course/ Medical Decision Making/ A&P                           Medical Decision Making Amount and/or Complexity of Data Reviewed Radiology: ordered.  Risk Prescription drug management.   51 year old female presents today for evaluation of left lower extremity pain that has been ongoing for about 1 year.  Worsened over the past 6 months.  Patient was previously evaluated by orthopedics for this and was told she had arthritis in both of her hips.  She does point to her left hip and has been most concentrated area of pain otherwise she states her entire quadricep muscle group is painful.  She has been ambulating without difficulty.  Has full range of motion and strength on exam.  2+ DP pulses are present.  Without spinal process tenderness.  Without abdominal tenderness.  X-ray demonstrates moderate to severe degenerative changes of the left hip.  Toradol given in the emergency room.  Advised patient to follow-up with the orthopedic group she saw 6 months ago.  I have provided her with on-call orthopedic group information in case she does  not have access to her previous orthopedic office.  Patient voices understanding and is in agreement with plan.  Return precautions discussed.  Patient states over the past several months she has taken significant amount of NSAIDs and was told to stop by her primary care provider.  Will not prescribe additional NSAIDs at this time.  Discussed use of arthritis strength Tylenol.   Final Clinical Impression(s) / ED Diagnoses Final diagnoses:  Left hip pain  Osteoarthritis of left hip, unspecified osteoarthritis type    Rx / DC Orders ED Discharge Orders     None         Marita Kansasli, Tanmay Halteman, PA-C 08/29/21 1604    Lorre NickAllen, Anthony, MD 08/29/21 2249

## 2021-08-29 NOTE — ED Triage Notes (Signed)
Pt presents with c/o left thigh pain. Pt reports pain present for approx one year, worsening over the last few weeks and days.

## 2021-12-18 ENCOUNTER — Encounter (HOSPITAL_COMMUNITY): Payer: Self-pay

## 2021-12-18 ENCOUNTER — Other Ambulatory Visit: Payer: Self-pay

## 2021-12-18 ENCOUNTER — Emergency Department (HOSPITAL_COMMUNITY)
Admission: EM | Admit: 2021-12-18 | Discharge: 2021-12-18 | Disposition: A | Discharge status: Home / Self Care | Payer: Medicaid Other | Attending: Student | Admitting: Student

## 2021-12-18 DIAGNOSIS — M542 Cervicalgia: Secondary | ICD-10-CM | POA: Insufficient documentation

## 2021-12-18 DIAGNOSIS — Y92410 Unspecified street and highway as the place of occurrence of the external cause: Secondary | ICD-10-CM | POA: Diagnosis not present

## 2021-12-18 DIAGNOSIS — R252 Cramp and spasm: Secondary | ICD-10-CM | POA: Diagnosis not present

## 2021-12-18 MED ORDER — LIDOCAINE 5 % EX PTCH
1.0000 | MEDICATED_PATCH | CUTANEOUS | Status: DC
  Administered 2021-12-18: 1 via TRANSDERMAL
  Filled 2021-12-18: qty 1

## 2021-12-18 MED ORDER — IBUPROFEN 200 MG PO TABS
600.0000 mg | ORAL_TABLET | Freq: Once | ORAL | Status: AC
  Administered 2021-12-18: 600 mg via ORAL
  Filled 2021-12-18: qty 3
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
## 2022-04-07 IMAGING — CT CT MAXILLOFACIAL W/ CM
3 series · 15 of 47 positions shown, 18 images · IV contrast (omnipaque)
Comparison: None.

CLINICAL DATA: Initial evaluation for maxillary/facial abscess.

EXAM:
CT MAXILLOFACIAL WITH CONTRAST
TECHNIQUE: Multidetector CT imaging of the maxillofacial structures was
performed with intravenous contrast. Multiplanar CT image
reconstructions were also generated.
CONTRAST:  80mL OMNIPAQUE IOHEXOL 350 MG/ML SOLN

[Series 3: max soft · axial · 0.36mm/px · z∈[+1344,+1478]mm · 9 of 79 slices shown, 12 images]
[im 6/79  brain]
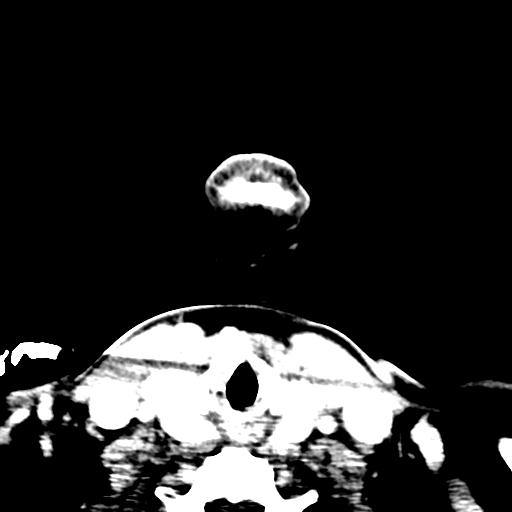
[im 6/79  bone]
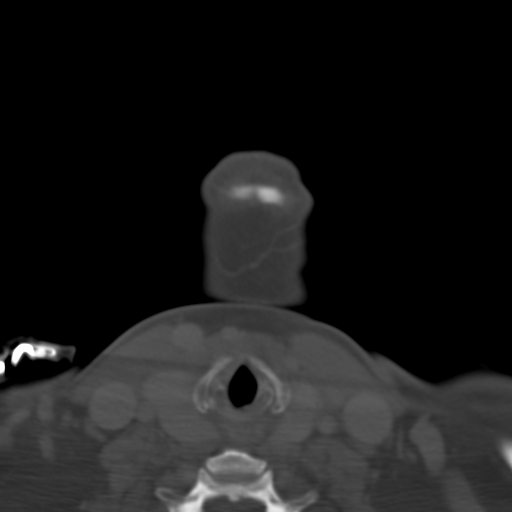
[im 14/79  bone]
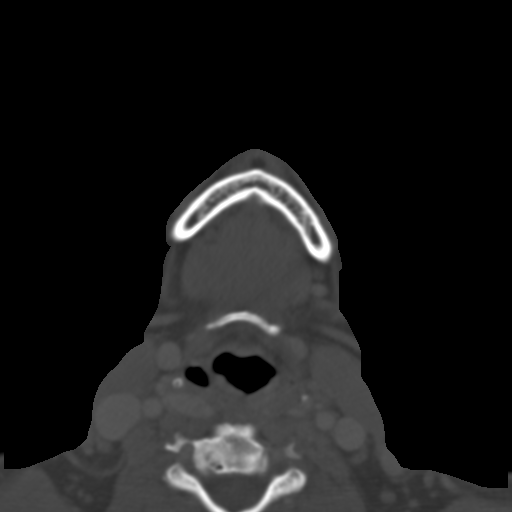
[im 22/79  bone]
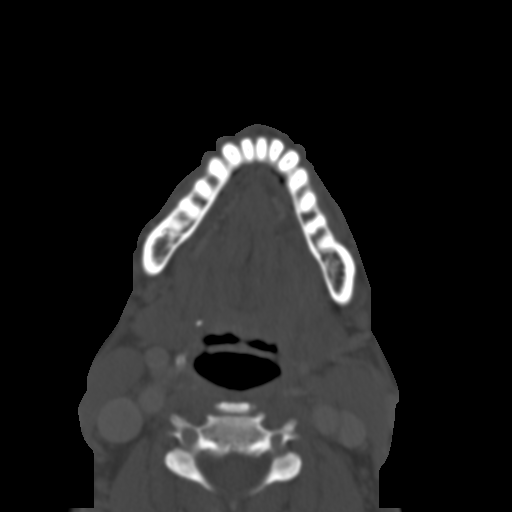
[im 30/79  bone]
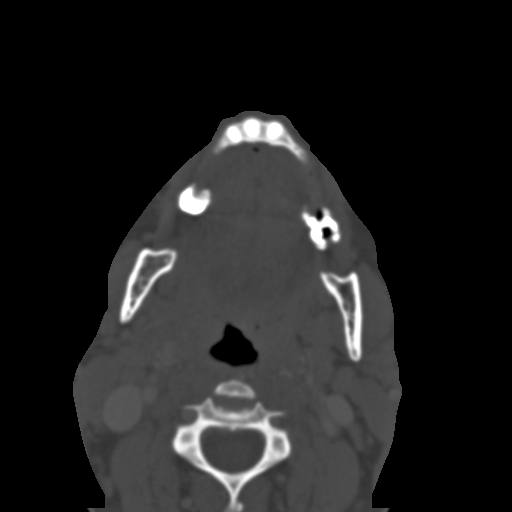
[im 41/79  brain]
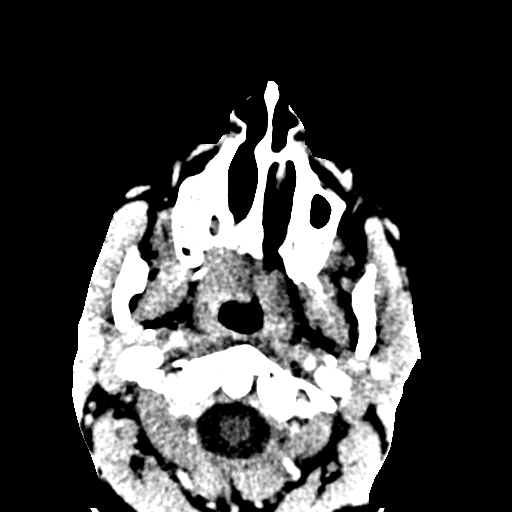
[im 41/79  bone]
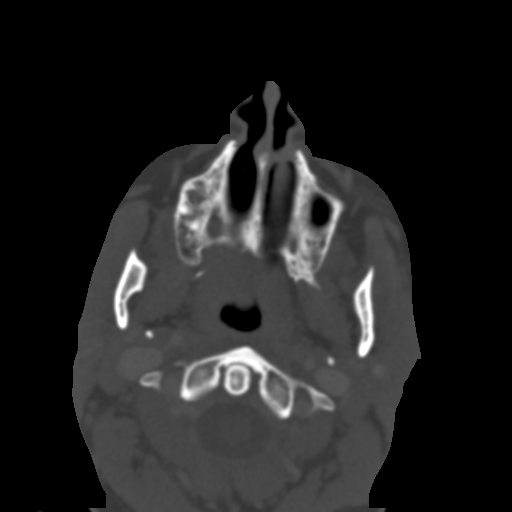
[im 49/79  bone]
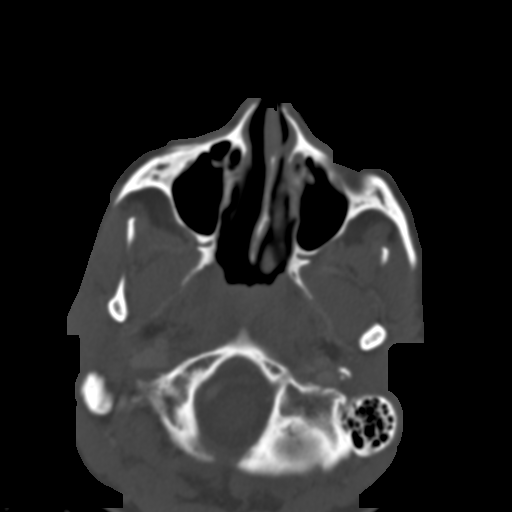
[im 57/79  bone]
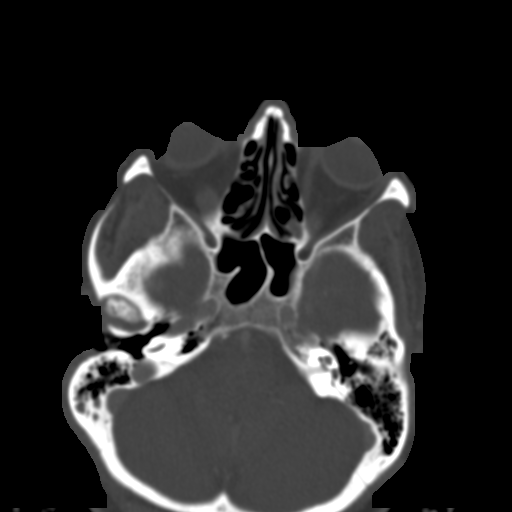
[im 65/79  bone]
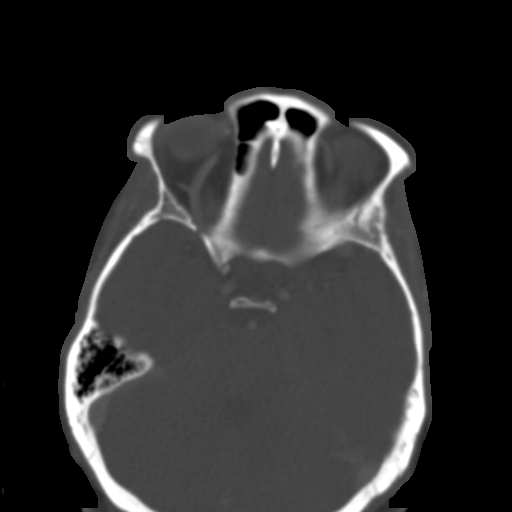
[im 73/79  brain]
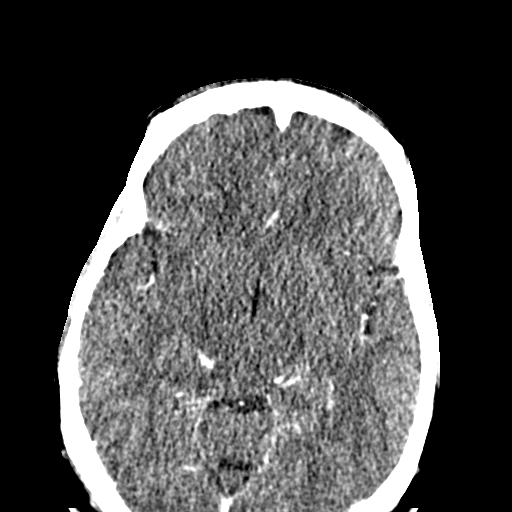
[im 73/79  bone]
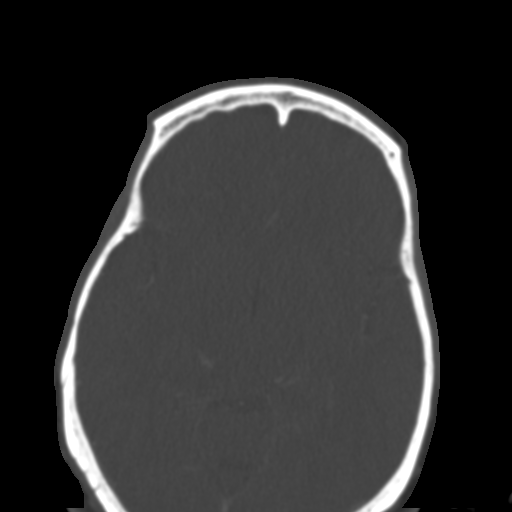

[Series 7: coronal soft · coronal · 0.36mm/px · 3 of 79 slices shown]
[im 27/79  bone]
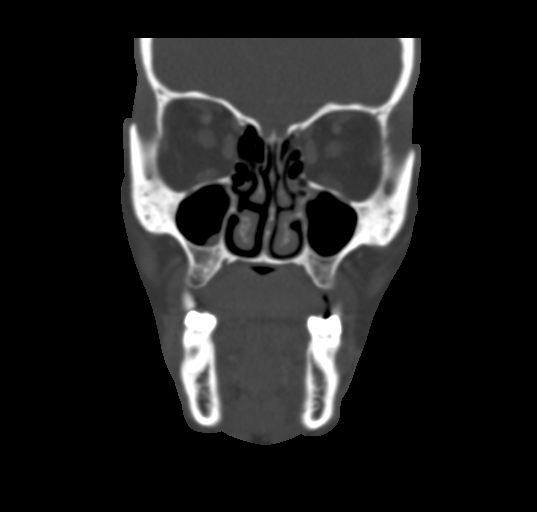
[im 35/79  bone]
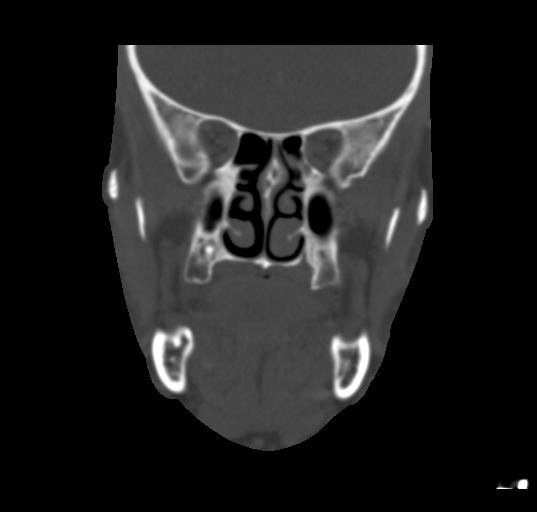
[im 44/79  bone]
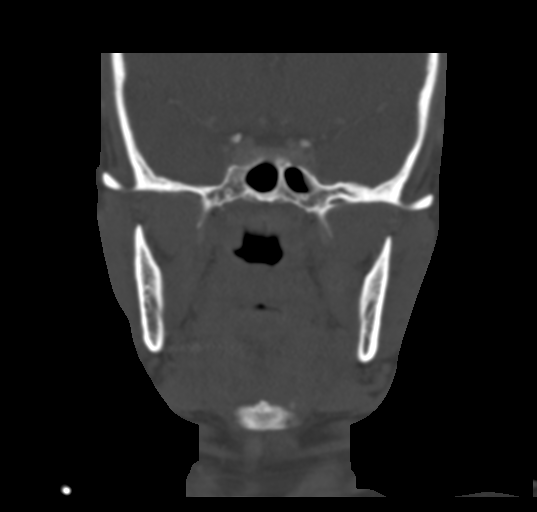

[Series 8: sagittal soft · sagittal · 0.31mm/px · 3 of 82 slices shown]
[im 28/82  bone]
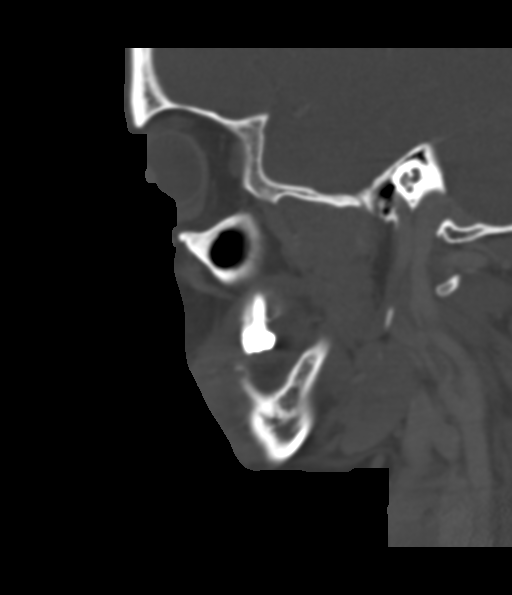
[im 41/82  bone]
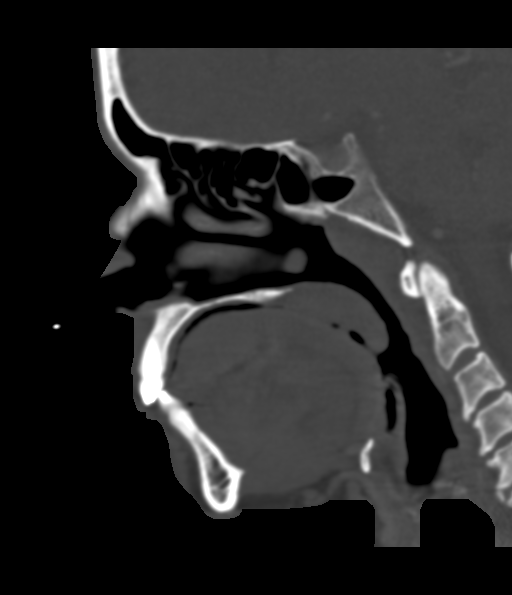
[im 55/82  bone]
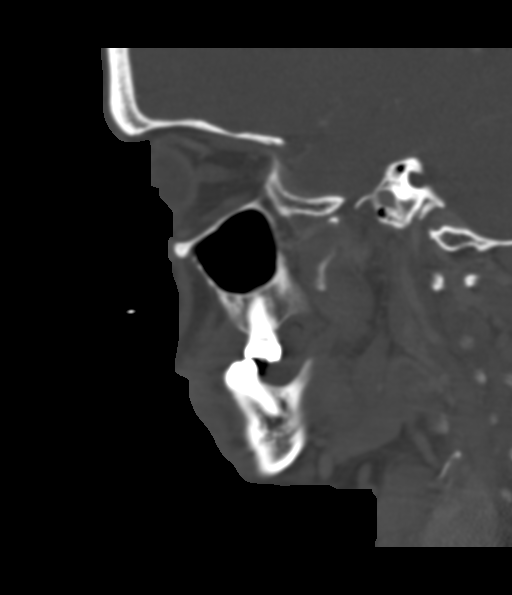

[15 of 47 positions shown; findings below may reference images not displayed]

FINDINGS: Osseous: No acute osseous abnormality. No discrete or worrisome
osseous lesions. Remote posttraumatic defect noted at the left
lamina papyracea. Degenerative osteoarthritic changes noted about
the TMJs bilaterally. Note made of a focal dental Poul-Henning at the right
first mandibular molar.

Orbits: Globes and orbital soft tissues within normal limits. No
evidence for intraorbital or postseptal cellulitis.

Sinuses: Scattered mucoperiosteal thickening present about the
sphenoethmoidal and maxillary sinuses, likely allergic/inflammatory
nature. No air-fluid levels to suggest acute sinusitis. Small
chronic appearing right mastoid effusion noted. Visualized
nasopharynx unremarkable. Middle ear cavities are clear.

Soft tissues: There is question of a small area of mild tissue
stranding involving the subcutaneous fat of the left face, lateral
to the left mandibular body (series 3, image 56). Changes are
extremely mild in nature, and not entirely certain, but could
reflect early and/or mild infection/cellulitis. No other significant
soft tissue swelling or inflammatory changes seen elsewhere about
the face. No discrete abscess or drainable fluid collection.
Salivary glands including the parotid and submandibular glands are
within normal limits. Partially visualized thyroid within normal
limits. No concerning adenopathy within the visualized upper neck.

Limited intracranial: Unremarkable.
IMPRESSION: 1. Question small area of mild tissue stranding involving the
subcutaneous fat of the left face, lateral to the left mandibular
body. Changes are extremely mild in nature, and not entirely
certain, but could reflect changes of early and/or mild
infection/cellulitis. Correlation with physical exam recommended. No
discrete abscess or drainable fluid collection.
2. Mild mucoperiosteal thickening about the sphenoethmoidal and
maxillary sinuses, likely allergic/inflammatory in nature. No
air-fluid levels to suggest acute sinusitis.
3. Focal dental Poul-Henning at the right first mandibular molar.
4. Remote posttraumatic defect at the left lamina papyracea.

## 2022-08-25 IMAGING — CR DG HIP (WITH OR WITHOUT PELVIS) 2-3V*L*
3 series · 3 of 3 positions shown · non-contrast
Comparison: None.

CLINICAL DATA: Left hip pain

EXAM:
DG HIP (WITH OR WITHOUT PELVIS) 3V LEFT

[t pelvis ap]
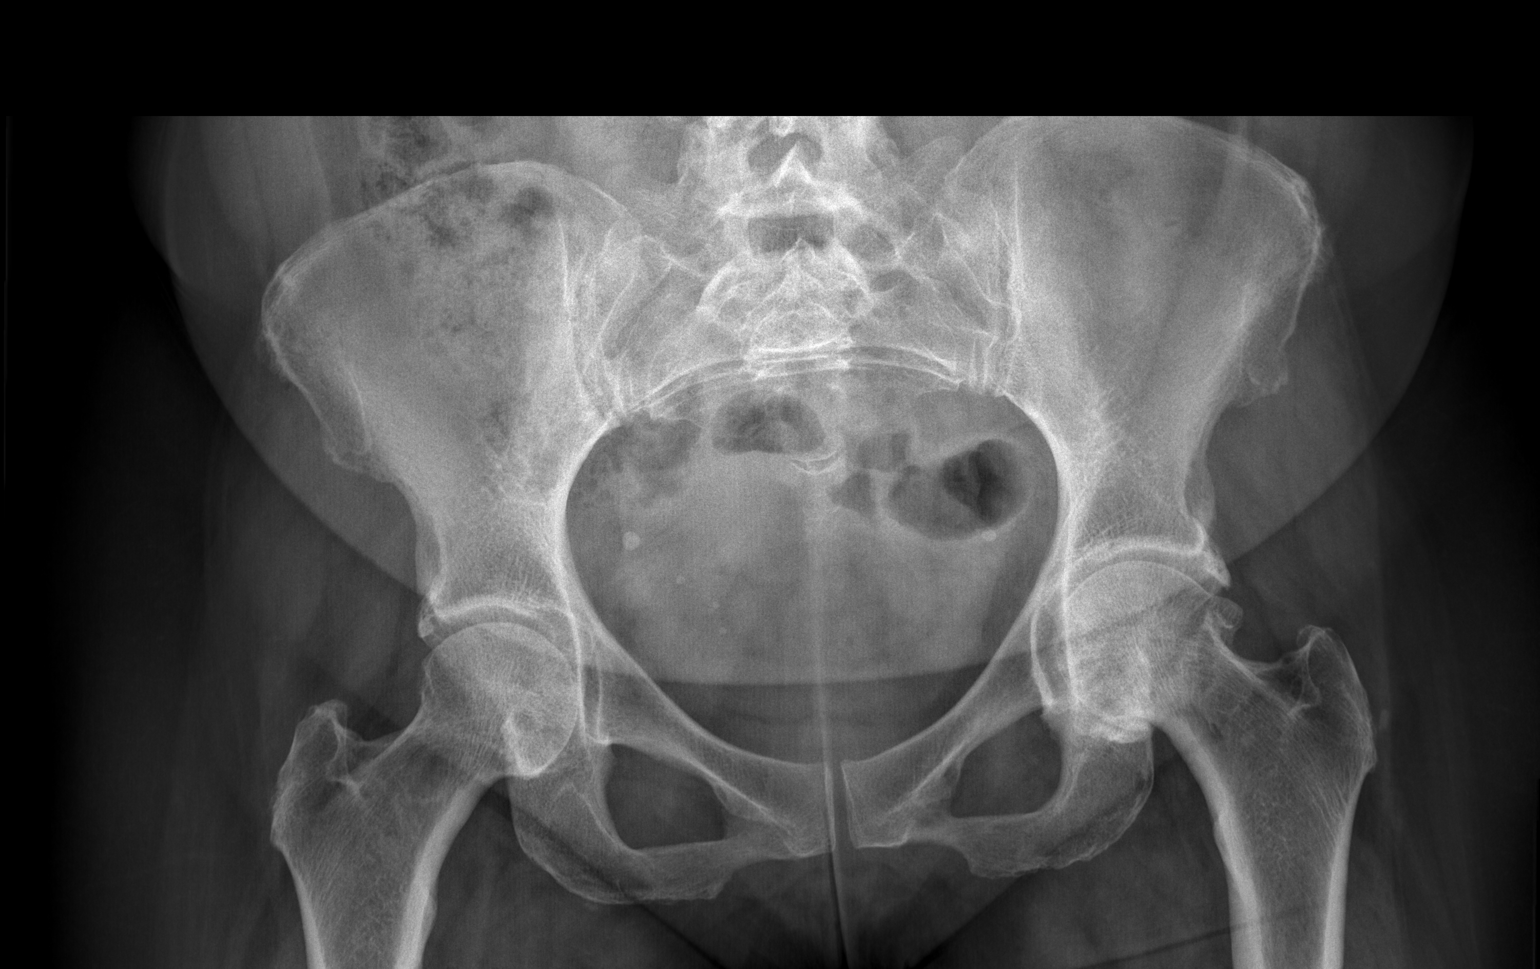

[t hip ap left]
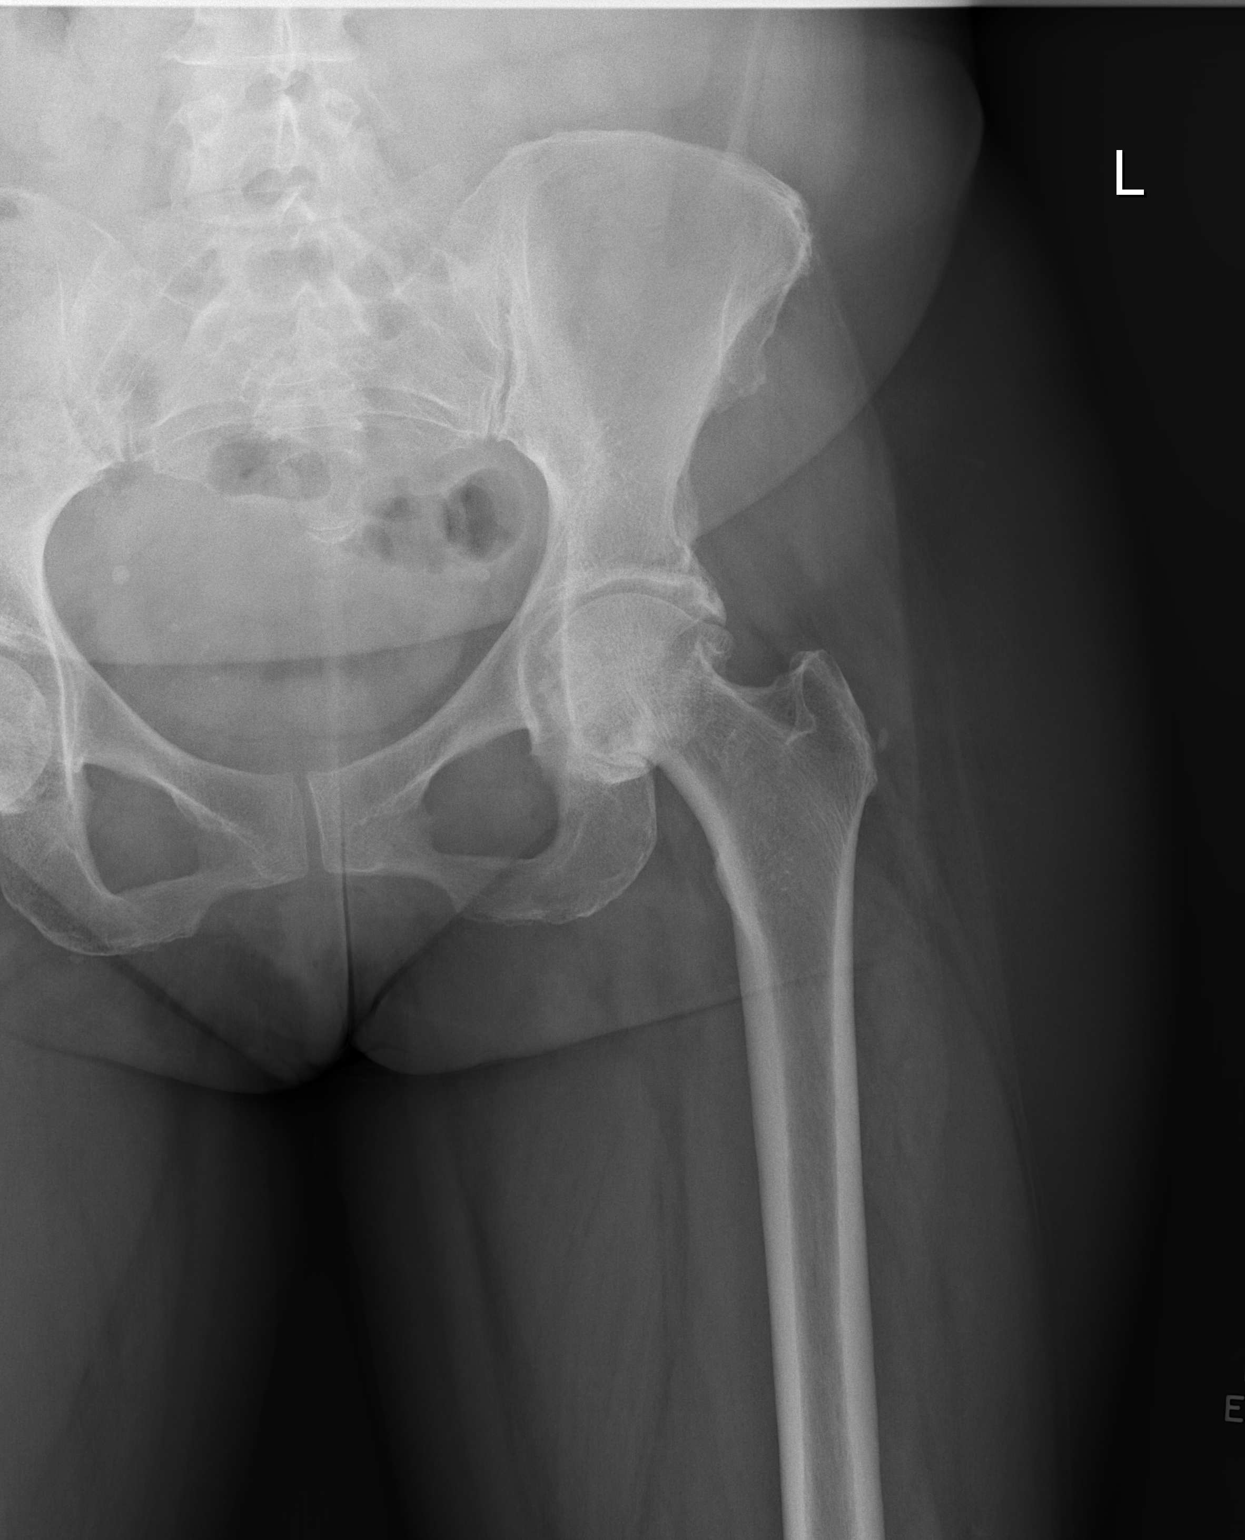

[t hip frog leg left]
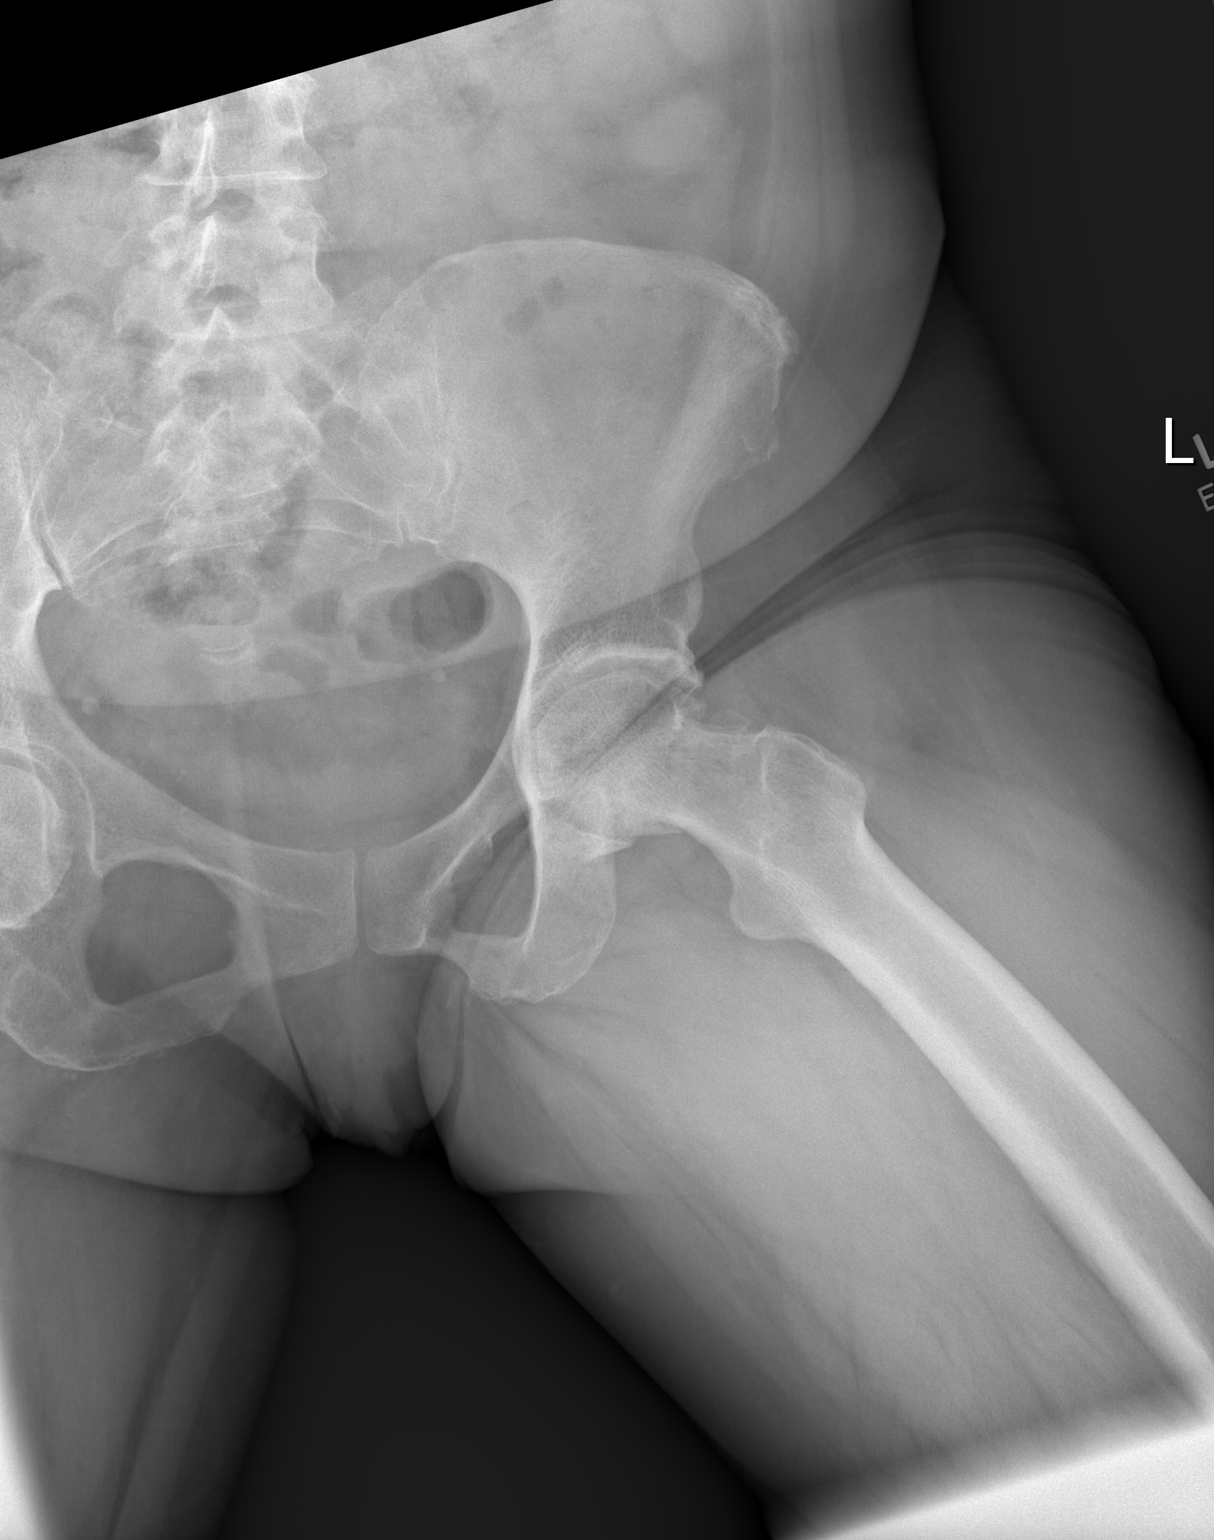

[3 of 3 positions shown; findings below may reference images not displayed]

FINDINGS: There is no evidence of hip fracture or dislocation. Moderate severe
degenerative changes of the left hip. Mild degenerative changes of
the right hip. Soft tissues are unremarkable.
IMPRESSION: Moderate to severe degenerative changes of the left hip. No acute
osseous abnormality.

## 2022-09-02 ENCOUNTER — Ambulatory Visit: Payer: Medicaid Other | Admitting: Physician Assistant

## 2022-09-02 ENCOUNTER — Encounter: Payer: Self-pay | Admitting: Physician Assistant

## 2022-09-02 VITALS — BP 108/67 | HR 69 | Ht 61.5 in | Wt 154.0 lb

## 2022-09-02 DIAGNOSIS — M25552 Pain in left hip: Secondary | ICD-10-CM | POA: Diagnosis not present

## 2022-09-02 DIAGNOSIS — G8929 Other chronic pain: Secondary | ICD-10-CM

## 2022-09-02 DIAGNOSIS — F17210 Nicotine dependence, cigarettes, uncomplicated: Secondary | ICD-10-CM

## 2022-09-02 DIAGNOSIS — M069 Rheumatoid arthritis, unspecified: Secondary | ICD-10-CM | POA: Diagnosis not present

## 2022-09-02 MED ORDER — PREDNISONE 5 MG PO TABS: ORAL_TABLET | ORAL | 0 refills | Status: AC

## 2022-09-02 MED ORDER — IBUPROFEN 600 MG PO TABS: 600.0000 mg | ORAL_TABLET | Freq: Four times a day (QID) | ORAL | 0 refills | Status: DC | PRN

## 2022-09-02 NOTE — Progress Notes (Signed)
New Patient Office Visit  Subjective    Patient ID: Sheri Hall, female    DOB: October 28, 1970  Age: 52 y.o. MRN: [id number]  CC:  Chief Complaint  Patient presents with   Hip Pain    Pt pain score 6, pt was doing PT     HPI Sheri Hall states that she suffers from rheumatoid arthritis, is currently being treated by rheumatology with Humira, and also attends physical therapy.  States that she does have a follow-up appointment with rheumatology next month, but states that she has been experiencing pain in her left hip and both hands make it difficult for her to complete her daily activities including her job at [employer].  Note from last rheumatology visit: 07/11/22  Sheri Hall is a 52 y.o. female who is here for follow up of rheumatoid arthritis. Currently OFF of both Humira and methotrexate. She stopped medication when she traveled to [place] and never resumed. She is having some more joint pain today, but not as much has she would have thought. Patient denies associated bleeding/clotting problems, fevers, proximal muscle weakness, oral ulcers, pleurisy, rashes/photosensitive, Raynaud's, seizures, bloody loose stool, or eye inflammation. Patient also denies history of recurrent genital ulcerations.   Assessment:  Sheri Hall is a 52 y.o. African American female with rheumatoid arthritis. She is off of Humira and methotrexate. Pt stopped her medications while on a trip to [place] recently. She has a little more pain today, but has not had severe return of symptoms. We discussed in depth and will resume Humira today and continue to hold the methotrexate.    Plan:   - Continue Humira 40 mg every 2 weeks  - Hold methotrexate for now, but plan to resume if Humira is not adequately covering her symptoms.  - CBC, Hepatic function and creatinine every 3 months - FU 3-4 months   States today that she does plan on resuming methotrexate at her next follow-up with rheumatology.  States  that prednisone tapers have been successful in the past with giving her relief.  Outpatient Encounter Medications as of 09/02/2022  Medication Sig   Adalimumab 40 MG/0.4ML PNKT Inject 0.4 mLs into the skin every 14 (fourteen) days.   albuterol (VENTOLIN HFA) 108 (90 Base) MCG/ACT inhaler Inhale 2 puffs into the lungs every 6 (six) hours as needed for wheezing or shortness of breath.   ipratropium (ATROVENT HFA) 17 MCG/ACT inhaler Inhale 2 puffs into the lungs every 6 (six) hours as needed for wheezing.   methotrexate (RHEUMATREX) 2.5 MG tablet Take 15 mg by mouth once a week.   predniSONE (DELTASONE) 5 MG tablet Take 4 tablets (20 mg total) by mouth daily with breakfast for 5 days, THEN 3 tablets (15 mg total) daily with breakfast for 5 days, THEN 2 tablets (10 mg total) daily with breakfast for 5 days, THEN 1 tablet (5 mg total) daily with breakfast for 5 days.   benzonatate (TESSALON) 100 MG capsule Take 1 capsule (100 mg total) by mouth every 8 (eight) hours. (Patient not taking: Reported on 09/02/2022)   chlorhexidine (PERIDEX) 0.12 % solution Use as directed 15 mLs in the mouth or throat daily. (Patient not taking: Reported on 09/02/2022)   DENTA 5000 PLUS 1.1 % CREA dental cream Place 1 application onto teeth daily. (Patient not taking: Reported on [id number])   folic acid (FOLVITE) 1 MG tablet Take 1 mg by mouth daily. (Patient not taking: Reported on 09/02/2022)   guaiFENesin-dextromethorphan (ROBITUSSIN DM) 100-10 MG/5ML syrup Take 10  mLs by mouth every 4 (four) hours as needed for cough. (Patient not taking: Reported on 04/09/2021)   ibuprofen (ADVIL) 600 MG tablet Take 1 tablet (600 mg total) by mouth every 6 (six) hours as needed.   valACYclovir (VALTREX) 1000 MG tablet Take 1,000 mg by mouth 2 (two) times daily as needed (breakouts). (Patient not taking: Reported on 09/02/2022)   [DISCONTINUED] ibuprofen (ADVIL) 600 MG tablet Take 1 tablet (600 mg total) by mouth every 6 (six) hours as needed.  (Patient not taking: Reported on 04/09/2021)   No facility-administered encounter medications on file as of 09/02/2022.    Past Medical History:  Diagnosis Date   Anxiety    Arthritis    rheumatoid    Asthma    seasonal   Bipolar disorder (HCC)    Depression    GERD (gastroesophageal reflux disease)    OCD (obsessive compulsive disorder)    PTSD (post-traumatic stress disorder)     Past Surgical History:  Procedure Laterality Date   CESAREAN SECTION     OPEN REDUCTION INTERNAL FIXATION (ORIF) DISTAL RADIAL FRACTURE Left 10/15/2015   Procedure: OPEN TREATMENT OF LEFT DISTAL RADIUS FRACTURE;  Surgeon: Milly Jakob, MD;  Location: [facility];  Service: Orthopedics;  Laterality: Left;    Family History  Problem Relation Age of Onset   Healthy Mother    Healthy Father     Social History   Socioeconomic History   Marital status: Single    Spouse name: Not on file   Number of children: Not on file   Years of education: Not on file   Highest education level: Not on file  Occupational History   Not on file  Tobacco Use   Smoking status: Every Day    Packs/day: 0.50    Types: Cigarettes   Smokeless tobacco: Never  Vaping Use   Vaping Use: Never used  Substance and Sexual Activity   Alcohol use: Yes    Comment: occ; liquor   Drug use: Yes    Types: Cocaine, "Crack" cocaine    Comment: wednesday March 22nd 2017   Sexual activity: Yes    Birth control/protection: I.U.D.    Comment: Mirena  Other Topics Concern   Not on file  Social History Narrative   Not on file   Social Determinants of Health   Financial Resource Strain: Not on file  Food Insecurity: Not on file  Transportation Needs: Not on file  Physical Activity: Not on file  Stress: Not on file  Social Connections: Not on file  Intimate Partner Violence: Not on file    Review of Systems  Constitutional:  Negative for chills and fever.  HENT: Negative.    Eyes: Negative.    Respiratory:  Negative for shortness of breath.   Cardiovascular:  Negative for chest pain.  Gastrointestinal: Negative.   Genitourinary: Negative.   Musculoskeletal:  Positive for joint pain and myalgias.  Skin: Negative.   Neurological: Negative.   Endo/Heme/Allergies: Negative.   Psychiatric/Behavioral: Negative.          Objective    BP 108/67 (BP Location: Left Arm, Patient Position: Sitting, Cuff Size: Normal)   Pulse 69   Ht 5' 1.5" (1.562 m)   Wt 154 lb (69.9 kg)   SpO2 97%   BMI 28.63 kg/m   Physical Exam Vitals and nursing note reviewed.  Constitutional:      Appearance: Normal appearance.  HENT:     Head: Normocephalic and atraumatic.  Right Ear: External ear normal.     Left Ear: External ear normal.     Nose: Nose normal.     Mouth/Throat:     Mouth: Mucous membranes are moist.     Pharynx: Oropharynx is clear.  Eyes:     Extraocular Movements: Extraocular movements intact.     Conjunctiva/sclera: Conjunctivae normal.     Pupils: Pupils are equal, round, and reactive to light.  Cardiovascular:     Rate and Rhythm: Normal rate and regular rhythm.     Pulses: Normal pulses.          Dorsalis pedis pulses are 2+ on the right side and 2+ on the left side.       Posterior tibial pulses are 2+ on the right side and 2+ on the left side.     Heart sounds: Normal heart sounds.  Pulmonary:     Effort: Pulmonary effort is normal.     Breath sounds: Normal breath sounds.  Musculoskeletal:     Right hand: Deformity and bony tenderness present. Decreased strength.     Left hand: Deformity and bony tenderness present. Decreased strength.     Cervical back: Normal range of motion and neck supple.     Right hip: Normal.     Left hip: Tenderness present. Decreased range of motion.     Comments: left hip lateral tenderness to palpation.  Skin:    General: Skin is warm and dry.  Neurological:     General: No focal deficit present.     Mental Status: She is  alert and oriented to person, place, and time.  Psychiatric:        Mood and Affect: Mood normal.        Behavior: Behavior normal.        Thought Content: Thought content normal.        Judgment: Judgment normal.       Assessment & Plan:   Problem List Items Addressed This Visit       Musculoskeletal and Integument   Rheumatoid arthritis ([place]) - Primary   Relevant Medications   predniSONE (DELTASONE) 5 MG tablet   ibuprofen (ADVIL) 600 MG tablet   Other Visit Diagnoses     Chronic hip pain, left       Relevant Medications   predniSONE (DELTASONE) 5 MG tablet   ibuprofen (ADVIL) 600 MG tablet      1. Rheumatoid arthritis involving both hands, unspecified whether rheumatoid factor present (HCC) Trial prednisone taper.  Patient education given on supportive care, red flags given for prompt reevaluation.  Courtesy refill of ibuprofen given, encourage patient to hold ibuprofen until prednisone taper has completed.  Patient understands and agrees.  Continue follow-up with rheumatology. - predniSONE (DELTASONE) 5 MG tablet; Take 4 tablets (20 mg total) by mouth daily with breakfast for 5 days, THEN 3 tablets (15 mg total) daily with breakfast for 5 days, THEN 2 tablets (10 mg total) daily with breakfast for 5 days, THEN 1 tablet (5 mg total) daily with breakfast for 5 days.  Dispense: 50 tablet; Refill: 0 - ibuprofen (ADVIL) 600 MG tablet; Take 1 tablet (600 mg total) by mouth every 6 (six) hours as needed.  Dispense: 30 tablet; Refill: 0  2. Chronic hip pain, left  - predniSONE (DELTASONE) 5 MG tablet; Take 4 tablets (20 mg total) by mouth daily with breakfast for 5 days, THEN 3 tablets (15 mg total) daily with breakfast for 5 days, THEN 2 tablets (  10 mg total) daily with breakfast for 5 days, THEN 1 tablet (5 mg total) daily with breakfast for 5 days.  Dispense: 50 tablet; Refill: 0 - ibuprofen (ADVIL) 600 MG tablet; Take 1 tablet (600 mg total) by mouth every 6 (six) hours as  needed.  Dispense: 30 tablet; Refill: 0   I have reviewed the patient's medical history (PMH, PSH, Social History, Family History, Medications, and allergies) , and have been updated if relevant. I spent 25 minutes reviewing chart and  face to face time with patient.    Return if symptoms worsen or fail to improve.   Loraine Grip Mayers, PA-C

## 2022-09-02 NOTE — Patient Instructions (Signed)
I sent a prescription for a steroid taper to your pharmacy.  I also sent a prescription for ibuprofen 600 mg.  I encourage you to wait to use the ibuprofen until after you have completed the steroid taper.  I hope that you feel better soon, please let us know if there is anything else we can do for you  Kennieth Rad, PA-C Physician Assistant [email or web address]  Prednisone Tablets What is this medication? PREDNISONE (PRED ni sone) treats many conditions such as asthma, allergic reactions, arthritis, inflammatory bowel diseases, adrenal, and blood or bone marrow disorders. It works by decreasing inflammation, slowing down an overactive immune system, or replacing cortisol normally made in the body. Cortisol is a hormone that plays an important role in how the body responds to stress, illness, and injury. It belongs to a group of medications called steroids. This medicine may be used for other purposes; ask your health care provider or pharmacist if you have questions. COMMON BRAND NAME(S): Deltasone, Predone, Sterapred, Sterapred DS What should I tell my care team before I take this medication? They need to know if you have any of these conditions: Cushing's syndrome Diabetes Glaucoma Heart disease High blood pressure Infection (especially a virus infection such as chickenpox, cold sores, or herpes) Kidney disease Liver disease Mental illness Myasthenia gravis Osteoporosis Seizures Stomach or intestine problems Thyroid disease An unusual or allergic reaction to lactose, prednisone, other medications, foods, dyes, or preservatives Pregnant or trying to get pregnant Breast-feeding How should I use this medication? Take this medication by mouth with a glass of water. Follow the directions on the prescription label. Take this medication with food. If you are taking this medication once a day, take it in the morning. Do  not take more medication than you are told to take. Do not suddenly stop taking your medication because you may develop a severe reaction. Your care team will tell you how much medication to take. If your care team wants you to stop the medication, the dose may be slowly lowered over time to avoid any side effects. Talk to your care team about the use of this medication in children. Special care may be needed. Overdosage: If you think you have taken too much of this medicine contact a poison control center or emergency room at once. NOTE: This medicine is only for you. Do not share this medicine with others. What if I miss a dose? If you miss a dose, take it as soon as you can. If it is almost time for your next dose, talk to your care team. You may need to miss a dose or take an extra dose. Do not take double or extra doses without advice. What may interact with this medication? Do not take this medication with any of the following: Metyrapone Mifepristone This medication may also interact with the following: Aminoglutethimide Amphotericin B Aspirin and aspirin-like medications Barbiturates Certain medications for diabetes, like glipizide or glyburide Cholestyramine Cholinesterase inhibitors Cyclosporine Digoxin Diuretics Ephedrine Female hormones, like estrogens and birth control pills Isoniazid Ketoconazole NSAIDS, medications for pain and inflammation, like ibuprofen or naproxen Phenytoin Rifampin Toxoids Vaccines Warfarin This list may not describe all possible interactions. Give your health care provider a list of all the medicines, herbs, non-prescription drugs, or dietary supplements you use. Also tell them if you smoke, drink alcohol, or use illegal drugs. Some items may interact with your medicine. What should I watch for while using this medication? Visit your care team  for regular checks on your progress. If you are taking this medication over a prolonged period, carry an  identification card with your name and address, the type and dose of your medication, and your care team's name and address. This medication may increase your risk of getting an infection. Tell your care team if you are around anyone with measles or chickenpox, or if you develop sores or blisters that do not heal properly. If you are going to have surgery, tell your care team that you have taken this medication within the last twelve months. Ask your care team about your diet. You may need to lower the amount of salt you eat. This medication may increase blood sugar. Ask your care team if changes in diet or medications are needed if you have diabetes. What side effects may I notice from receiving this medication? Side effects that you should report to your care team as soon as possible: Allergic reactions--skin rash, itching, hives, swelling of the face, lips, tongue, or throat Cushing syndrome--increased fat around the midsection, upper back, neck, or face, pink or purple stretch marks on the skin, thinning, fragile skin that easily bruises, unexpected hair growth High blood sugar (hyperglycemia)--increased thirst or amount of urine, unusual weakness or fatigue, blurry vision Increase in blood pressure Infection--fever, chills, cough, sore throat, wounds that don't heal, pain or trouble when passing urine, general feeling of discomfort or being unwell Low adrenal gland function--nausea, vomiting, loss of appetite, unusual weakness or fatigue, dizziness Mood and behavior changes--anxiety, nervousness, confusion, hallucinations, irritability, hostility, thoughts of suicide or self-harm, worsening mood, feelings of depression Stomach bleeding--bloody or black, tar-like stools, vomiting blood or brown material that looks like coffee grounds Swelling of the ankles, hands, or feet Side effects that usually do not require medical attention (report to your care team if they continue or are  bothersome): Acne General discomfort and fatigue Headache Increase in appetite Nausea Trouble sleeping Weight gain This list may not describe all possible side effects. Call your doctor for medical advice about side effects. You may report side effects to FDA at 1-800-FDA-1088. Where should I keep my medication? Keep out of the reach of children. Store at room temperature between 15 and 30 degrees C (59 and 86 degrees F). Protect from light. Keep container tightly closed. Throw away any unused medication after the expiration date. NOTE: This sheet is a summary. It may not cover all possible information. If you have questions about this medicine, talk to your doctor, pharmacist, or health care provider.  2023 Elsevier/Gold Standard (2007-08-28 00:00:00)

## 2022-12-30 ENCOUNTER — Encounter: Payer: Self-pay | Admitting: Physician Assistant

## 2022-12-30 ENCOUNTER — Ambulatory Visit: Payer: Medicaid Other | Admitting: Physician Assistant

## 2022-12-30 VITALS — BP 110/72 | HR 67 | Ht 61.5 in | Wt 156.0 lb

## 2022-12-30 DIAGNOSIS — M069 Rheumatoid arthritis, unspecified: Secondary | ICD-10-CM

## 2022-12-30 DIAGNOSIS — J4520 Mild intermittent asthma, uncomplicated: Secondary | ICD-10-CM

## 2022-12-30 DIAGNOSIS — F17210 Nicotine dependence, cigarettes, uncomplicated: Secondary | ICD-10-CM

## 2022-12-30 MED ORDER — ALBUTEROL SULFATE HFA 108 (90 BASE) MCG/ACT IN AERS: 2.0000 | INHALATION_SPRAY | Freq: Four times a day (QID) | RESPIRATORY_TRACT | 0 refills | Status: DC | PRN

## 2022-12-30 MED ORDER — CETIRIZINE HCL 10 MG PO TABS: 10.0000 mg | ORAL_TABLET | Freq: Every day | ORAL | 11 refills | Status: DC

## 2022-12-30 NOTE — Progress Notes (Signed)
Established Patient Office Visit  Subjective   Patient ID: Sheri Hall, female    DOB: 11/15/1970  Age: 52 y.o. MRN: [id number]  Chief Complaint  Patient presents with   Nasal Congestion    OCT's cold/ cough medication used, with no relief     Pain    Arthritis in hands and feet, pain score of 8 /10    Medication Refill    Hx of asthma, taking only albuterol, need refill other inhauler's     States that she has been experiencing postnasal drip, states that this has been ongoing for the last 2 to 3 months.  States that she feels she is clearing her throat constantly.  States that she does cough occasionally, sometimes forces herself to cough to cough up phlegm.  Denies any other upper respiratory symptoms.  States that she has tried Robitussin and Mucinex without relief.  States that she has seen rheumatology and did restart her Humira.  States that she does continue to experience 8 out of 10 pain on a daily basis in her hands and feet.  States that she does use Tylenol and ibuprofen without much relief.  States that she does have a follow-up with rheumatology next month and is supposed to be able to start methotrexate at that time.  States that she has been trying to attend physical therapy appointments, but however had to reschedule the last physical therapy.  States last physical therapy appointment was the beginning of May, states Is Thursday.  Does endorse that she has been having to use her rescue inhaler approximately 4 times a week.  States that she uses it after work when she is making the "long walk to her car".  Denies nighttime awakenings.does endorse history of smoking, states that she smoked approximately 1 pack a day for 20 years, states that she has been a non-smoker for the past 4 years.   Past Medical History:  Diagnosis Date   Anxiety    Arthritis    rheumatoid    Asthma    seasonal   Bipolar disorder (HCC)    Depression    GERD (gastroesophageal reflux  disease)    OCD (obsessive compulsive disorder)    PTSD (post-traumatic stress disorder)    Social History   Socioeconomic History   Marital status: Single    Spouse name: Not on file   Number of children: Not on file   Years of education: Not on file   Highest education level: Not on file  Occupational History   Not on file  Tobacco Use   Smoking status: Every Day    Packs/day: .5    Types: Cigarettes   Smokeless tobacco: Never  Vaping Use   Vaping Use: Never used  Substance and Sexual Activity   Alcohol use: Yes    Comment: occ; liquor   Drug use: Yes    Types: Cocaine, "Crack" cocaine    Comment: wednesday March 22nd 2017   Sexual activity: Yes    Birth control/protection: I.U.D.    Comment: Mirena  Other Topics Concern   Not on file  Social History Narrative   Not on file   Social Determinants of Health   Financial Resource Strain: Not on file  Food Insecurity: Not on file  Transportation Needs: Not on file  Physical Activity: Not on file  Stress: Not on file  Social Connections: Not on file  Intimate Partner Violence: Not on file   Family History  Problem Relation Age of  Onset   Healthy Mother    Healthy Father    No Known Allergies  Review of Systems  Constitutional:  Negative for chills and fever.  HENT: Negative.    Eyes: Negative.   Respiratory:  Negative for shortness of breath.   Cardiovascular:  Negative for chest pain.  Gastrointestinal:  Negative for nausea and vomiting.  Genitourinary: Negative.   Musculoskeletal:  Positive for joint pain.  Skin: Negative.   Neurological: Negative.   Endo/Heme/Allergies: Negative.   Psychiatric/Behavioral: Negative.        Objective:     BP 110/72 (BP Location: Left Arm, Patient Position: Sitting, Cuff Size: Large)   Pulse 67   Ht 5' 1.5" (1.562 m)   Wt 156 lb (70.8 kg)   SpO2 97%   BMI 29.00 kg/m  BP Readings from Last 3 Encounters:  12/30/22 110/72  09/02/22 108/67  12/18/21 (!) 160/88    Wt Readings from Last 3 Encounters:  12/30/22 156 lb (70.8 kg)  09/02/22 154 lb (69.9 kg)  04/10/21 147 lb 4.3 oz (66.8 kg)      Physical Exam Vitals and nursing note reviewed.  Constitutional:      Appearance: Normal appearance.  HENT:     Head: Normocephalic and atraumatic.     Right Ear: Tympanic membrane, ear canal and external ear normal.     Left Ear: Tympanic membrane, ear canal and external ear normal.     Mouth/Throat:     Mouth: Mucous membranes are moist.     Pharynx: Oropharynx is clear.  Eyes:     Extraocular Movements: Extraocular movements intact.     Conjunctiva/sclera: Conjunctivae normal.     Pupils: Pupils are equal, round, and reactive to light.  Cardiovascular:     Rate and Rhythm: Normal rate and regular rhythm.     Pulses: Normal pulses.     Heart sounds: Normal heart sounds.  Pulmonary:     Effort: Pulmonary effort is normal.     Breath sounds: Normal breath sounds.  Musculoskeletal:        General: Normal range of motion.     Cervical back: Normal range of motion and neck supple.  Skin:    General: Skin is warm and dry.  Neurological:     General: No focal deficit present.     Mental Status: She is alert and oriented to person, place, and time.  Psychiatric:        Mood and Affect: Mood normal.        Behavior: Behavior normal.        Thought Content: Thought content normal.        Judgment: Judgment normal.       Assessment & Plan:   Problem List Items Addressed This Visit       Musculoskeletal and Integument   Rheumatoid arthritis (HCC) - Primary   Other Visit Diagnoses     Mild intermittent asthma without complication       Relevant Medications   cetirizine (ZYRTEC ALLERGY) 10 MG tablet   albuterol (VENTOLIN HFA) 108 (90 Base) MCG/ACT inhaler   Other Relevant Orders   Ambulatory referral to Pulmonology     1. Rheumatoid arthritis involving both hands, unspecified whether rheumatoid factor present [facility]) Encouraged patient  to follow-up with rheumatology and physical therapy.  Patient understands and agrees.  2. Mild intermittent asthma without complication Trial Zyrtec.  Continue albuterol as needed.  Patient would prefer to be seen by pulmonology rather than follow-up with  primary care provider over this issue.  Referral started.  Patient education given on supportive care.  Red flags given for prompt reevaluation. - Ambulatory referral to Pulmonology - cetirizine (ZYRTEC ALLERGY) 10 MG tablet; Take 1 tablet (10 mg total) by mouth daily.  Dispense: 30 tablet; Refill: 11 - albuterol (VENTOLIN HFA) 108 (90 Base) MCG/ACT inhaler; Inhale 2 puffs into the lungs every 6 (six) hours as needed for wheezing or shortness of breath.  Dispense: 1 each; Refill: 0   I have reviewed the patient's medical history (PMH, PSH, Social History, Family History, Medications, and allergies) , and have been updated if relevant. I spent 30 minutes reviewing chart and  face to face time with patient.   Return if symptoms worsen or fail to improve.    Kasandra Knudsen Mayers, PA-C

## 2022-12-30 NOTE — Patient Instructions (Signed)
To help with your postnasal drip, you are going to start taking Zyrtec on a daily basis.    I started a referral for you to be seen by pulmonology for further evaluation of your asthma.  I strongly encourage you to reach out to your rheumatologist to see if you can get a sooner appointment, and also speak to your providers nurse to see if they are able to offer relief from your current pain.  I hope that you feel better soon, please let us know if there is anything else we can do for you.  Roney Jaffe, PA-C Physician Assistant [facility] [email or web address]   Allergies, Adult An allergy is a condition that causes the body's defense system (immune system) to react too strongly to an allergen. An allergen is a substance that is harmless to most people but can cause a reaction in some people. Allergies often affect the nose (allergic rhinitis), eyes (conjunctivitis), skin (atopic dermatitis), and stomach. They can be mild, moderate, or severe. They cannot spread from person to person. Allergies can start at any age. In some cases, they may go away as you get older. What are the causes? Allergies are caused by allergens. These may be: Outdoor allergens. These include pollen, car fumes, and mold. Indoor allergens. These include dust, smoke, mold, and pet dander. Other allergens. These include foods, medicines, scents, and insect bites or stings. What increases the risk? You are more likely to have allergies if you have: Family members with allergies. Family members who have a condition that may be caused by allergens, such as asthma. What are the signs or symptoms? Symptoms depend on how severe your allergy is. Mild to moderate symptoms Runny nose, stuffy nose (nasal congestion), or sneezing. Itchy mouth, ears, or throat. Postnasal drip. This is a feeling of mucus dripping down the back of your throat. Sore throat. Itchy, red,  watery, or puffy eyes. Skin rash, or itchy, red, swollen areas of skin (hives). Stomach cramps or bloating. Severe symptoms A bad allergy to food, medicine, or insect bites may cause a severe reaction (anaphylactic reaction). Symptoms include: A red face. Coughing or making high-pitched whistling sounds when you breathe, most often when you breathe out (wheezing). Swollen lips, tongue, or mouth. A tight or swollen throat. Chest pain or tightness, or a fast heartbeat. Trouble breathing or shortness of breath. Pain in your abdomen. Vomiting or diarrhea. Feeling dizzy or fainting. How is this diagnosed? Allergies are diagnosed based on your symptoms, your family and medical history, and a physical exam. You may also have tests, such as: Skin tests. These may be done to see how your skin reacts to allergens. Tests include: Skin prick test. For this test, the allergen is put in your body through a small prick in the skin. Intradermal skin test. For this test, a small amount of the allergen is put under the first layer of your skin. Patch test. For this test, a small amount of the allergen is placed on your skin. The area is covered and then checked after a few days. Blood tests. A challenge test. For this test, you eat or breathe in the allergen to see if you have a reaction. You may also be asked to: Keep a food diary. This means writing down all the foods, drinks, and symptoms you have in a day. Try an elimination diet. To do this: Stop eating certain foods. Add those foods back one by one to find out if any of  them cause a reaction. How is this treated?     Treatment for allergies depends on your symptoms. It may include: Cold, wet cloths (cold compresses). These can be used to soothe itching and swelling. Eye drops or nasal sprays. A saline solution to clear out your nose and keep it moist (nasal irrigation). A saline solution is made of salt and water. A humidifier. This can add  moisture to the air. Skin creams. These can treat rashes or itching. Diet changes to cut out foods that cause allergies. Being exposed again and again to tiny amounts of allergens. This can help your body build a defense against them (tolerance). This process is called immunotherapy. It may be done using: Allergy shots. This is when you get a shot of the allergen. Sublingual immunotherapy. This is when you take a small dose of the allergen under your tongue. Allergy medicines (antihistamines) or other medicines. These can help block the allergic reaction. Using an auto-injector pen. An auto-injector pen is a device filled with medicine that gives an emergency shot of epinephrine. Your health care provider will teach you how to use it. Follow these instructions at home: Medicines  Take or apply over-the-counter and prescription medicines only as told by your provider. Always carry your auto-injector pen if you are at risk of an anaphylactic reaction. Give yourself the shot as told by your provider. Eating and drinking Follow instructions from your provider about what you may eat and drink. Drink enough fluid to keep your pee (urine) pale yellow. General instructions Wear a medical alert bracelet or necklace if you have had an anaphylactic reaction in the past. Avoid known allergens when you can. Keep all follow-up visits. Your provider will watch your symptoms and talk about treatment options with you. Contact a health care provider if: Your symptoms do not get better with treatment. Get help right away if: You have any symptoms of anaphylactic reaction. You use an auto-injector pen. You will need more medical care even if the medicine seems to be working. An anaphylactic reaction may happen again within 72 hours (rebound anaphylaxis). These symptoms may be an emergency. Use the auto-injector pen right away. Then call 911. Do not wait to see if the symptoms will go away. Do not drive  yourself to the hospital. This information is not intended to replace advice given to you by your health care provider. Make sure you discuss any questions you have with your health care provider. Document Revised: 03/19/2022 Document Reviewed: 03/19/2022 Elsevier Patient Education  2024 [employer].

## 2023-06-09 DIAGNOSIS — R400 Somnolence: Secondary | ICD-10-CM | POA: Insufficient documentation

## 2024-01-15 ENCOUNTER — Emergency Department (HOSPITAL_COMMUNITY)
Admission: EM | Admit: 2024-01-15 | Discharge: 2024-01-15 | Disposition: A | Discharge status: Home / Self Care | Payer: MEDICAID | Attending: Emergency Medicine | Admitting: Emergency Medicine

## 2024-01-15 ENCOUNTER — Other Ambulatory Visit: Payer: Self-pay

## 2024-01-15 ENCOUNTER — Emergency Department (HOSPITAL_COMMUNITY): Payer: MEDICAID

## 2024-01-15 ENCOUNTER — Encounter (HOSPITAL_COMMUNITY): Payer: Self-pay

## 2024-01-15 DIAGNOSIS — W010XXA Fall on same level from slipping, tripping and stumbling without subsequent striking against object, initial encounter: Secondary | ICD-10-CM | POA: Insufficient documentation

## 2024-01-15 DIAGNOSIS — Y93E5 Activity, floor mopping and cleaning: Secondary | ICD-10-CM | POA: Insufficient documentation

## 2024-01-15 DIAGNOSIS — S8992XA Unspecified injury of left lower leg, initial encounter: Secondary | ICD-10-CM | POA: Insufficient documentation

## 2024-01-15 DIAGNOSIS — Y92009 Unspecified place in unspecified non-institutional (private) residence as the place of occurrence of the external cause: Secondary | ICD-10-CM | POA: Insufficient documentation

## 2024-01-15 MED ORDER — OXYCODONE-ACETAMINOPHEN 5-325 MG PO TABS: 1.0000 | ORAL_TABLET | Freq: Four times a day (QID) | ORAL | 0 refills | Status: DC | PRN

## 2024-01-15 MED ORDER — OXYCODONE-ACETAMINOPHEN 5-325 MG PO TABS
2.0000 | ORAL_TABLET | Freq: Once | ORAL | Status: AC
  Administered 2024-01-15: 2 via ORAL
  Filled 2024-01-15: qty 2

## 2024-01-15 NOTE — ED Triage Notes (Signed)
 Patient presented to ER post fall. Patient states she was mopping floor and slipped, heard crunching sound. Patient endorses pain to L knee. Patient denies hitting head, denies LOC, denies taking blood thinners.

## 2024-01-15 NOTE — ED Provider Notes (Signed)
 [place] EMERGENCY DEPARTMENT AT [facility] Provider Note   CSN: [id number] Arrival date & time: 01/15/24  1229     Patient presents with: Knee Injury   Sheri Hall is a 53 y.o. female presents with anterior left knee pain that began after she had a slip and fall at home.  She states that she was mopping the floor when she hit a wet area and her legs split laterally.  She had a sudden onset of sharp stabbing pain to the medial aspect of the left knee as well as the anterior knee.  No appreciated deformity to the knee, brought in by POV.  She is unable to bear weight on the affected leg due to pain in the knee with weightbearing.   HPI     Prior to Admission medications   Medication Sig Start Date End Date Taking? Authorizing Provider  oxyCODONE -acetaminophen  (PERCOCET/ROXICET) 5-325 MG tablet Take 1 tablet by mouth every 6 (six) hours as needed for severe pain (pain score 7-10). 01/15/24  Yes Myriam Dorn BROCKS, PA  Adalimumab 40 MG/0.4ML PNKT Inject 0.4 mLs into the skin every 14 (fourteen) days. 01/11/21   [provider]  albuterol  (VENTOLIN  HFA) 108 (90 Base) MCG/ACT inhaler Inhale 2 puffs into the lungs every 6 (six) hours as needed for wheezing or shortness of breath. 12/30/22   Mayers, Cari S, PA-C  benzonatate  (TESSALON ) 100 MG capsule Take 1 capsule (100 mg total) by mouth every 8 (eight) hours. Patient not taking: Reported on 09/02/2022 04/11/21   Brien Therisa MATSU, MD  cetirizine  (ZYRTEC  ALLERGY) 10 MG tablet Take 1 tablet (10 mg total) by mouth daily. 12/30/22   Mayers, Cari S, PA-C  chlorhexidine (PERIDEX) 0.12 % solution Use as directed 15 mLs in the mouth or throat daily. Patient not taking: Reported on 09/02/2022 03/21/21   [provider]  DENTA 5000 PLUS 1.1 % CREA dental cream Place 1 application onto teeth daily. Patient not taking: Reported on 09/02/2022 03/21/21   [provider]  folic acid  (FOLVITE ) 1 MG tablet Take 1 mg by mouth  daily. Patient not taking: Reported on 09/02/2022 01/11/21   [provider]  guaiFENesin -dextromethorphan  (ROBITUSSIN DM) 100-10 MG/5ML syrup Take 10 mLs by mouth every 4 (four) hours as needed for cough. Patient not taking: Reported on 04/09/2021 03/26/20   [place], Camellia PARAS, DO  ibuprofen  (ADVIL ) 600 MG tablet Take 1 tablet (600 mg total) by mouth every 6 (six) hours as needed. 09/02/22   Mayers, Cari S, PA-C  ipratropium (ATROVENT  HFA) 17 MCG/ACT inhaler Inhale 2 puffs into the lungs every 6 (six) hours as needed for wheezing. Patient not taking: Reported on 12/30/2022 03/26/20   [place], Camellia PARAS, DO  methotrexate (RHEUMATREX) 2.5 MG tablet Take 15 mg by mouth once a week. 01/11/21   [provider]  valACYclovir (VALTREX) 1000 MG tablet Take 1,000 mg by mouth 2 (two) times daily as needed (breakouts). Patient not taking: Reported on 09/02/2022 03/21/21   [provider]    Allergies: Patient has no known allergies.    Review of Systems  Musculoskeletal:  Positive for arthralgias and joint swelling.  All other systems reviewed and are negative.   Updated Vital Signs BP 118/81   Pulse 73   Resp 16   Ht 5' 1 (1.549 m)   Wt 71 kg   SpO2 99%   BMI 29.58 kg/m   Physical Exam Vitals and nursing note reviewed.  Constitutional:  General: She is not in acute distress.    Appearance: Normal appearance.  HENT:     Head: Normocephalic and atraumatic.     Mouth/Throat:     Mouth: Mucous membranes are moist.     Pharynx: Oropharynx is clear.   Eyes:     Extraocular Movements: Extraocular movements intact.     Conjunctiva/sclera: Conjunctivae normal.     Pupils: Pupils are equal, round, and reactive to light.    Cardiovascular:     Rate and Rhythm: Normal rate and regular rhythm.     Pulses: Normal pulses.     Heart sounds: Normal heart sounds. No murmur heard.    No friction rub. No gallop.  Pulmonary:     Effort: Pulmonary effort is normal.     Breath  sounds: Normal breath sounds.  Abdominal:     General: Abdomen is flat. Bowel sounds are normal.     Palpations: Abdomen is soft.   Musculoskeletal:        General: Normal range of motion.     Cervical back: Normal range of motion and neck supple.     Right knee: Normal.     Left knee: Swelling present. No deformity. Tenderness present over the medial joint line and ACL. ACL laxity present.     Instability Tests: Anterior drawer test positive. Posterior drawer test negative. Anterior Lachman test positive. Medial McMurray test negative and lateral McMurray test negative.   Skin:    General: Skin is warm and dry.     Capillary Refill: Capillary refill takes less than 2 seconds.   Neurological:     General: No focal deficit present.     Mental Status: She is alert. Mental status is at baseline.   Psychiatric:        Mood and Affect: Mood normal.     (all labs ordered are listed, but only abnormal results are displayed) Labs Reviewed - No data to display  EKG: None  Radiology: DG Knee 2 Views Left Result Date: 01/15/2024 CLINICAL DATA:  Left knee pain after fall today. EXAM: LEFT KNEE - 1-2 VIEW COMPARISON:  None Available. FINDINGS: No evidence of fracture, dislocation, or joint effusion. No evidence of arthropathy or other focal bone abnormality. Soft tissues are unremarkable. IMPRESSION: Negative. Electronically Signed   By: Lynwood Landy Raddle M.D.   On: 01/15/2024 14:08     Procedures   Medications Ordered in the ED  oxyCODONE -acetaminophen  (PERCOCET/ROXICET) 5-325 MG per tablet 2 tablet (has no administration in time range)                                    Medical Decision Making Amount and/or Complexity of Data Reviewed Radiology: ordered.  Risk Prescription drug management.   Medical Decision Making:   Sheri Hall is a 53 y.o. female who presented to the ED today with left knee pain and swelling detailed above.     Complete initial physical exam  performed, notably the patient  was alert and oriented in no apparent distress.  Left knee swelling and positive anterior drawer and Lachman on the left. .    Reviewed and confirmed nursing documentation for past medical history, family history, social history.    Initial Assessment:   With the patient's presentation of left knee pain, most likely diagnosis is ACL rupture and possible sprain of the MCL..   Initial Plan:  Obtain x-ray imaging of  the left knee Provide analgesia with oxycodone /acetaminophen  as previously noted Referral to orthopedics for definitive follow-up Objective evaluation as below reviewed   Initial Study Results:       Radiology:  All images reviewed independently. Agree with radiology report at this time.   DG Knee 2 Views Left Result Date: 01/15/2024 CLINICAL DATA:  Left knee pain after fall today. EXAM: LEFT KNEE - 1-2 VIEW COMPARISON:  None Available. FINDINGS: No evidence of fracture, dislocation, or joint effusion. No evidence of arthropathy or other focal bone abnormality. Soft tissues are unremarkable. IMPRESSION: Negative. Electronically Signed   By: Lynwood Landy Raddle M.D.   On: 01/15/2024 14:08     Reassessment and Plan:   Independently reviewed and interpreted x-ray imaging of the left knee, no bony abnormalities are noted at this time.  Based on the clinical presentation and physical assessment, believe this patient has signs consistent with an ACL rupture and MCL sprain.  Will provide initial analgesia with oxycodone /acetaminophen  given during her ED visit, prescribed same for home use.  Also will provide knee immobilization and encourage patient to elevate and apply ice to the affected knee.  Orthopedics follow-up given.       Final diagnoses:  Injury of left knee, initial encounter    ED Discharge Orders          Ordered    oxyCODONE -acetaminophen  (PERCOCET/ROXICET) 5-325 MG tablet  Every 6 hours PRN        01/15/24 1438                Myriam Dorn BROCKS, [place] 01/15/24 1445    Charlyn Sora, MD 01/16/24 [phone]

## 2024-01-15 NOTE — Progress Notes (Signed)
 Orthopedic Tech Progress Note Patient Details:  Sheri Hall 1971-05-22 [id number]  Ortho Devices Type of Ortho Device: Crutches, Knee Immobilizer Ortho Device/Splint Location: LLE Ortho Device/Splint Interventions: Application   Post Interventions Patient Tolerated: Well  Sheri Hall 01/15/2024, 3:11 PM

## 2024-03-22 ENCOUNTER — Other Ambulatory Visit: Payer: Self-pay | Admitting: Physician Assistant

## 2024-03-22 DIAGNOSIS — Z1231 Encounter for screening mammogram for malignant neoplasm of breast: Secondary | ICD-10-CM

## 2024-03-29 ENCOUNTER — Ambulatory Visit
Admission: RE | Admit: 2024-03-29 | Discharge: 2024-03-29 | Disposition: A | Discharge status: Home / Self Care | Payer: MEDICAID | Source: Ambulatory Visit

## 2024-03-29 DIAGNOSIS — Z1231 Encounter for screening mammogram for malignant neoplasm of breast: Secondary | ICD-10-CM

## 2024-03-31 ENCOUNTER — Other Ambulatory Visit: Payer: Self-pay | Admitting: Physician Assistant

## 2024-03-31 DIAGNOSIS — N63 Unspecified lump in unspecified breast: Secondary | ICD-10-CM

## 2024-04-25 ENCOUNTER — Encounter: Payer: Self-pay | Admitting: Family Medicine

## 2024-04-25 ENCOUNTER — Ambulatory Visit (INDEPENDENT_AMBULATORY_CARE_PROVIDER_SITE_OTHER): Payer: MEDICAID | Admitting: Family Medicine

## 2024-04-25 VITALS — BP 115/77 | HR 54 | Ht 61.0 in | Wt 156.0 lb

## 2024-04-25 DIAGNOSIS — M05741 Rheumatoid arthritis with rheumatoid factor of right hand without organ or systems involvement: Secondary | ICD-10-CM

## 2024-04-25 DIAGNOSIS — J4520 Mild intermittent asthma, uncomplicated: Secondary | ICD-10-CM | POA: Diagnosis not present

## 2024-04-25 DIAGNOSIS — N6313 Unspecified lump in the right breast, lower outer quadrant: Secondary | ICD-10-CM

## 2024-04-25 DIAGNOSIS — Z0000 Encounter for general adult medical examination without abnormal findings: Secondary | ICD-10-CM | POA: Diagnosis not present

## 2024-04-25 DIAGNOSIS — M05742 Rheumatoid arthritis with rheumatoid factor of left hand without organ or systems involvement: Secondary | ICD-10-CM

## 2024-04-25 DIAGNOSIS — F4310 Post-traumatic stress disorder, unspecified: Secondary | ICD-10-CM

## 2024-04-25 DIAGNOSIS — F322 Major depressive disorder, single episode, severe without psychotic features: Secondary | ICD-10-CM

## 2024-04-25 DIAGNOSIS — K219 Gastro-esophageal reflux disease without esophagitis: Secondary | ICD-10-CM

## 2024-04-25 MED ORDER — OMEPRAZOLE 40 MG PO CPDR: 40.0000 mg | DELAYED_RELEASE_CAPSULE | Freq: Every day | ORAL | 3 refills | Status: DC

## 2024-04-25 MED ORDER — ALBUTEROL SULFATE HFA 108 (90 BASE) MCG/ACT IN AERS: 2.0000 | INHALATION_SPRAY | Freq: Four times a day (QID) | RESPIRATORY_TRACT | 0 refills | Status: AC | PRN

## 2024-04-25 NOTE — Assessment & Plan Note (Signed)
 Asthma with seasonal or allergy triggers. Reports phlegm and drainage, possibly related to allergies or reflux. No breathing trouble or cough today. - Prescribe albuterol  inhaler refill. - Start allergy medication such as Claritin or Zyrtec . - Use Mucinex  or Robitussin for thick phlegm.

## 2024-04-25 NOTE — Assessment & Plan Note (Signed)
 Long-standing rheumatoid arthritis managed with Humira. Plan to discuss methotrexate reintroduction with rheumatologist in January. - Continue Humira  - Follow up with rheumatologist routinely

## 2024-04-25 NOTE — Assessment & Plan Note (Signed)
See above - depression

## 2024-04-25 NOTE — Assessment & Plan Note (Signed)
 Multiple psychiatric conditions with mood changes due to environmental factors. Open to counseling and therapy. PHQ/GAD reviewed. NO SI/HI. - Refer to counseling for mood support. Not interested in medications right now.

## 2024-04-25 NOTE — Assessment & Plan Note (Signed)
 Chronic GERD managed with omeprazole. Severe reflux symptoms possibly exacerbated by diet. - Can occasionally try omeprazole twice daily. - Add Pepcid as needed for severe symptoms. - Advise dietary modifications to avoid reflux triggers such as citrus, mint, and caffeine

## 2024-04-25 NOTE — Progress Notes (Signed)
 New Patient Office Visit  Subjective    Patient ID: Sheri Hall, female    DOB: 11-18-1970  Age: 53 y.o. MRN: [id number]  CC:  Chief Complaint  Patient presents with   Establish Care    HPI Sheri Hall presents to establish care. She lives with her son (61 years old). She is not currently working.    Discussed the use of AI scribe software for clinical note transcription with the patient, who gave verbal consent to proceed.  History of Present Illness Sheri Hall is a 53 year old female who presents with a breast lump.  She has noticed a lump in her right breast for over six months, located on the side of the breast. The lump is sometimes sensitive to touch and has increased in size over time. No nipple changes, discharge, rashes, or significant warmth to the touch.  Her medical history includes rheumatoid arthritis, managed with Humira, and she is under the care of a rheumatologist. She no longer takes methotrexate. She also has anxiety, bipolar disorder, depression, OCD, PTSD, asthma, and reflux. She uses an albuterol  inhaler as needed for asthma and takes omeprazole for reflux, which she reports works well, though she does note frequent phlegm/drainage. Not currently on an antihistamine.   Her family history includes rheumatoid arthritis and diabetes on her mother's side, with her grandmother having both conditions.  Socially, she has a history of cocaine use but currently does not use alcohol , drugs, or tobacco. She quit smoking cigarettes and cigars in April 2021. She is not currently sexually active and does not use birth control.  She has not been on medication for bipolar disorder for a long time and is managing well without it, though she mentions going through some changes recently that have affected her mood. Denies SI/HI. Interested in counseling but not medications.      04/25/2024   11:05 AM 12/30/2022   10:04 AM 09/02/2022    2:53 PM  PHQ9 SCORE ONLY   PHQ-9 Total Score 9 11  8       Data saved with a previous flowsheet row definition      04/25/2024   11:06 AM 12/30/2022   10:05 AM  GAD 7 : Generalized Anxiety Score  Nervous, Anxious, on Edge 3 1  Control/stop worrying 2 1  Worry too much - different things 1 2  Trouble relaxing 1 2  Restless 1 1  Easily annoyed or irritable 2 2  Afraid - awful might happen 2 1  Total GAD 7 Score 12 10  Anxiety Difficulty Somewhat difficult Not difficult at all         Outpatient Encounter Medications as of 04/25/2024  Medication Sig   Adalimumab 40 MG/0.4ML PNKT Inject 0.4 mLs into the skin every 14 (fourteen) days.   cycloSPORINE (RESTASIS) 0.05 % ophthalmic emulsion instill 1 drop into each eye twice daily   [DISCONTINUED] albuterol  (VENTOLIN  HFA) 108 (90 Base) MCG/ACT inhaler Inhale 2 puffs into the lungs every 6 (six) hours as needed for wheezing or shortness of breath.   [DISCONTINUED] omeprazole (PRILOSEC) 40 MG capsule Take 1 capsule by mouth.   albuterol  (VENTOLIN  HFA) 108 (90 Base) MCG/ACT inhaler Inhale 2 puffs into the lungs every 6 (six) hours as needed for wheezing or shortness of breath.   omeprazole (PRILOSEC) 40 MG capsule Take 1 capsule (40 mg total) by mouth daily.   [DISCONTINUED] ARIPiprazole  (ABILIFY ) 10 MG tablet in the morning.   [DISCONTINUED] benzonatate  (TESSALON ) 100 MG capsule  Take 1 capsule (100 mg total) by mouth every 8 (eight) hours. (Patient not taking: Reported on 09/02/2022)   [DISCONTINUED] cetirizine  (ZYRTEC  ALLERGY) 10 MG tablet Take 1 tablet (10 mg total) by mouth daily.   [DISCONTINUED] chlorhexidine (PERIDEX) 0.12 % solution Use as directed 15 mLs in the mouth or throat daily. (Patient not taking: Reported on 09/02/2022)   [DISCONTINUED] DENTA 5000 PLUS 1.1 % CREA dental cream Place 1 application onto teeth daily. (Patient not taking: Reported on 09/02/2022)   [DISCONTINUED] folic acid  (FOLVITE ) 1 MG tablet Take 1 mg by mouth daily. (Patient not taking:  Reported on 09/02/2022)   [DISCONTINUED] gabapentin (NEURONTIN) 300 MG capsule every 8 (eight) hours.   [DISCONTINUED] guaiFENesin -dextromethorphan  (ROBITUSSIN DM) 100-10 MG/5ML syrup Take 10 mLs by mouth every 4 (four) hours as needed for cough. (Patient not taking: Reported on 04/09/2021)   [DISCONTINUED] hydrOXYzine  (ATARAX ) 25 MG tablet every 12 (twelve) hours.   [DISCONTINUED] ibuprofen  (ADVIL ) 600 MG tablet Take 1 tablet (600 mg total) by mouth every 6 (six) hours as needed.   [DISCONTINUED] ipratropium (ATROVENT  HFA) 17 MCG/ACT inhaler Inhale 2 puffs into the lungs every 6 (six) hours as needed for wheezing. (Patient not taking: Reported on 12/30/2022)   [DISCONTINUED] levonorgestrel (MIRENA, 52 MG,) 20 MCG/DAY IUD 1 ea   [DISCONTINUED] methotrexate (RHEUMATREX) 2.5 MG tablet Take 15 mg by mouth once a week.   [DISCONTINUED] oxyCODONE -acetaminophen  (PERCOCET/ROXICET) 5-325 MG tablet Take 1 tablet by mouth every 6 (six) hours as needed for severe pain (pain score 7-10).   [DISCONTINUED] valACYclovir (VALTREX) 1000 MG tablet Take 1,000 mg by mouth 2 (two) times daily as needed (breakouts). (Patient not taking: Reported on 09/02/2022)   No facility-administered encounter medications on file as of 04/25/2024.    Past Medical History:  Diagnosis Date   Anxiety    Arthritis    rheumatoid    Asthma    seasonal   Bipolar disorder (HCC)    Depression    GERD (gastroesophageal reflux disease)    OCD (obsessive compulsive disorder)    PTSD (post-traumatic stress disorder)     Past Surgical History:  Procedure Laterality Date   CESAREAN SECTION     OPEN REDUCTION INTERNAL FIXATION (ORIF) DISTAL RADIAL FRACTURE Left 10/15/2015   Procedure: OPEN TREATMENT OF LEFT DISTAL RADIUS FRACTURE;  Surgeon: Alm Hummer, MD;  Location: [facility];  Service: Orthopedics;  Laterality: Left;    Family History  Problem Relation Age of Onset   Healthy Mother    Healthy Father    Diabetes  Maternal Grandmother    Rheum arthritis Maternal Grandmother    Breast cancer Neg Hx     Social History   Socioeconomic History   Marital status: Single    Spouse name: Not on file   Number of children: Not on file   Years of education: Not on file   Highest education level: GED or equivalent  Occupational History   Not on file  Tobacco Use   Smoking status: Former    Current packs/day: 0.00    Average packs/day: 1 pack/day for 15.0 years (15.0 ttl pk-yrs)    Types: Cigarettes, Cigars    Quit date: 11/10/2019    Years since quitting: 4.4   Smokeless tobacco: Never  Vaping Use   Vaping status: Never Used  Substance and Sexual Activity   Alcohol  use: Not Currently   Drug use: Not Currently   Sexual activity: Not Currently    Birth control/protection: None  Other  Topics Concern   Not on file  Social History Narrative   Not on file   Social Drivers of Health   Financial Resource Strain: Medium Risk (04/18/2024)   Overall Financial Resource Strain (CARDIA)    Difficulty of Paying Living Expenses: Somewhat hard  Food Insecurity: Food Insecurity Present (04/18/2024)   Hunger Vital Sign    Worried About Running Out of Food in the Last Year: Sometimes true    Ran Out of Food in the Last Year: Sometimes true  Transportation Needs: Unmet Transportation Needs (04/18/2024)   PRAPARE - Administrator, Civil Service (Medical): Yes    Lack of Transportation (Non-Medical): Yes  Physical Activity: Insufficiently Active (04/18/2024)   Exercise Vital Sign    Days of Exercise per Week: 1 day    Minutes of Exercise per Session: 10 min  Stress: Stress Concern Present (04/18/2024)   [employer] of Occupational Health - Occupational Stress Questionnaire    Feeling of Stress: Rather much  Social Connections: Unknown (04/18/2024)   Social Connection and Isolation Panel    Frequency of Communication with Friends and Family: More than three times a week    Frequency of Social  Gatherings with Friends and Family: Once a week    Attends Religious Services: More than 4 times per year    Active Member of [employer] or Organizations: Yes    Attends Banker Meetings: More than 4 times per year    Marital Status: Patient declined  Intimate Partner Violence: Unknown (10/25/2021)   Received from [facility]   HITS    Physically Hurt: Not on file    Insult or Talk Down To: Not on file    Threaten Physical Harm: Not on file    Scream or Curse: Not on file    ROS All review of systems negative except what is listed in the HPI      Objective    BP 115/77   Pulse (!) 54   Ht 5' 1 (1.549 m)   Wt 156 lb (70.8 kg)   SpO2 99%   BMI 29.48 kg/m   Physical Exam Vitals reviewed.  Constitutional:      Appearance: Normal appearance.  Cardiovascular:     Rate and Rhythm: Normal rate and regular rhythm.     Heart sounds: Normal heart sounds.  Pulmonary:     Effort: Pulmonary effort is normal.     Breath sounds: Normal breath sounds.  Chest:       Comments: Marble size lump to right outer breast, no other changes noted Skin:    General: Skin is warm and dry.  Neurological:     Mental Status: She is alert and oriented to person, place, and time.  Psychiatric:        Mood and Affect: Mood normal.        Behavior: Behavior normal.        Thought Content: Thought content normal.        Judgment: Judgment normal.             Assessment & Plan:   Problem List Items Addressed This Visit       Active Problems   MDD (major depressive disorder), severe (HCC)   Multiple psychiatric conditions with mood changes due to environmental factors. Open to counseling and therapy. PHQ/GAD reviewed. NO SI/HI. - Refer to counseling for mood support. Not interested in medications right now.       Relevant Orders  Ambulatory referral to Behavioral Health   PTSD (post-traumatic stress disorder)   See above depression      Relevant Orders    Ambulatory referral to Behavioral Health   Rheumatoid arthritis involving both hands with positive rheumatoid factor (HCC)   Long-standing rheumatoid arthritis managed with Humira. Plan to discuss methotrexate reintroduction with rheumatologist in January. - Continue Humira  - Follow up with rheumatologist routinely      Gastroesophageal reflux disease   Chronic GERD managed with omeprazole. Severe reflux symptoms possibly exacerbated by diet. - Can occasionally try omeprazole twice daily. - Add Pepcid as needed for severe symptoms. - Advise dietary modifications to avoid reflux triggers such as citrus, mint, and caffeine      Relevant Medications   omeprazole (PRILOSEC) 40 MG capsule   Mild intermittent asthma without complication   Asthma with seasonal or allergy triggers. Reports phlegm and drainage, possibly related to allergies or reflux. No breathing trouble or cough today. - Prescribe albuterol  inhaler refill. - Start allergy medication such as Claritin or Zyrtec . - Use Mucinex  or Robitussin for thick phlegm.      Relevant Medications   albuterol  (VENTOLIN  HFA) 108 (90 Base) MCG/ACT inhaler   Other Visit Diagnoses       Mass of lower outer quadrant of right breast    -  Primary Right breast lump present for over six months, increased in size, no visible changes or signs of infection. - Order diagnostic mammogram and ultrasound for right breast lump.   Relevant Orders   MM Digital Diagnostic Bilat   US  BREAST COMPLETE UNI RIGHT INC AXILLA     Encounter for medical examination to establish care             Return for physical 3-4 months.   Waddell KATHEE Mon, NP

## 2024-06-29 ENCOUNTER — Telehealth: Payer: Self-pay | Admitting: Neurology

## 2024-06-29 NOTE — Telephone Encounter (Signed)
 Copied from CRM #[id number]. Topic: Referral - Status >> Jun 29, 2024  4:46 PM Jasmin G wrote: Reason for CRM: Pt states that she requested for a referral to get a mammogram about a month ago and she has not heard anything since. Call pt back at [phone].

## 2024-06-29 NOTE — Telephone Encounter (Signed)
 Looks like this was ordered from [facility] Rosina SAIL[facility], [place] for [facility].

## 2024-07-28 ENCOUNTER — Encounter: Payer: MEDICAID | Admitting: Family Medicine

## 2024-07-28 DIAGNOSIS — F3160 Bipolar disorder, current episode mixed, unspecified: Secondary | ICD-10-CM

## 2024-07-28 DIAGNOSIS — M05741 Rheumatoid arthritis with rheumatoid factor of right hand without organ or systems involvement: Secondary | ICD-10-CM

## 2024-07-28 DIAGNOSIS — J4520 Mild intermittent asthma, uncomplicated: Secondary | ICD-10-CM

## 2024-07-28 DIAGNOSIS — K219 Gastro-esophageal reflux disease without esophagitis: Secondary | ICD-10-CM

## 2024-08-01 NOTE — Progress Notes (Incomplete)
 "  Complete physical exam  Patient: Sheri Hall   DOB: 08-21-1970   54 y.o. Female  MRN: [id number]  Subjective:    No chief complaint on file.   Sheri Hall is a 54 y.o. female who presents today for a complete physical exam. She reports consuming a {diet types:17450} diet. {types:19826} She generally feels {DESC; WELL/FAIRLY WELL/POORLY:18703}. She reports sleeping {DESC; WELL/FAIRLY WELL/POORLY:18703}. She {does/does not:200015} have additional problems to discuss today.   Currently lives with: *** Acute concerns or interim problems since last visit: ***  Chronic Problems  Vision concerns: *** Dental concerns: *** STD concerns: ***  ETOH use: *** Nicotine  use: *** Recreational drugs/illegal substances: ***  Lung Cancer Screening with low-dose Chest CT: *** - Adults age 2-80 who are current cigarette smokers or quit within the last 15 years. Must have 20 pack year history.  AAA Screening: *** - Men age 50-75 who have ever smoked PSA: - (ages 74-69)? The natural history of prostate cancer and ongoing controversy regarding screening and potential treatment outcomes of prostate cancer has been discussed with the patient. The meaning of a false positive PSA and a false negative PSA has been discussed. He indicates understanding of the limitations of this screening test and wishes *** to proceed with screening PSA testing.  Females:  She is {Blank single:19197::currently,not currently,never}  sexually active  Contraception choices are: *** LMP: ***      Most recent fall risk assessment:    04/25/2024   11:05 AM  Fall Risk   Falls in the past year? 1  Number falls in past yr: 0  Injury with Fall? 1   Risk for fall due to : History of fall(s)  Follow up Falls evaluation completed     Data saved with a previous flowsheet row definition     Most recent depression screenings:    04/25/2024   11:05 AM 12/30/2022   10:04 AM  PHQ 2/9 Scores  PHQ - 2 Score 1  2  PHQ- 9 Score 9  11      Data saved with a previous flowsheet row definition          {History (Optional):23778}  Patient Care Team: Almarie Waddell NOVAK, NP as PCP - General (Family Medicine)   Show/hide medication list[1]  ROS        Objective:     There were no vitals taken for this visit. {Vitals History (Optional):23777}  Physical Exam   No results found for any visits on 08/03/24. {Show previous labs (optional):23779}    Assessment & Plan:    Routine Health Maintenance and Physical Exam Discussed health promotion and safety including diet and exercise recommendations, dental health, and injury prevention. Tobacco cessation if applicable. Seat belts, sunscreen, smoke detectors, etc.    Immunization History  Administered Date(s) Administered   PFIZER Comirnaty(Gray Top)Covid-19 Tri-Sucrose Vaccine 08/08/2020   PFIZER(Purple Top)SARS-COV-2 Vaccination 06/21/2020   PPD Test 03/25/2016    Health Maintenance  Topic Date Due   Hepatitis C Screening  Never done   Cervical Cancer Screening (HPV/Pap Cotest)  Never done   Mammogram  Never done   Colonoscopy  Never done   Influenza Vaccine  10/18/2024 (Originally 02/19/2024)   Zoster Vaccines- Shingrix (1 of 2) 10/25/2024 (Originally 03/03/1990)   COVID-19 Vaccine (3 - Pfizer risk series) 03/20/2025 (Originally 09/05/2020)   Pneumococcal Vaccine: 50+ Years (1 of 2 - PCV) 04/25/2025 (Originally 03/03/1990)   Hepatitis B Vaccines 19-59 Average Risk (1 of 3 - 19+ 3-dose  series) 04/25/2025 (Originally 03/03/1990)   DTaP/Tdap/Td (1 - Tdap) 07/27/2025 (Originally 03/03/1990)   HIV Screening  Completed   HPV VACCINES  Aged Out   Meningococcal B Vaccine  Aged Out   Lung Cancer Screening  Discontinued        Problem List Items Addressed This Visit   None      PATIENT COUNSELING:   Advised to take 1 mg of folate supplement per day if capable of pregnancy. ***  Encouraged smoking cessation. ***  Recommend that  most people either abstain from alcohol  or drink within safe limits (<=14/week and <=4 drinks/occasion for males, <=7/weeks and <= 3 drinks/occasion for females) and that the risk for alcohol  disorders and other health effects rises proportionally with the number of drinks per week and how often a drinker exceeds daily limits.   Diet: Recommend to adjust caloric intake to maintain or achieve ideal body weight, to reduce intake of dietary saturated fat and total fat, to limit sodium intake by avoiding high sodium foods and not adding table salt, and to maintain adequate dietary potassium and calcium preferably from fresh fruits, vegetables, and low-fat dairy products.   Emphasized the importance of regular exercise.  Injury prevention: Recommend seatbelts, safety helmets, smoke detector, etc..   Dental health: Recommend regular tooth brushing, flossing, and dental visits.       No follow-ups on file.     Waddell KATHEE Mon, NP  I,Emily Lagle,acting as a scribe for Waddell KATHEE Mon, NP.,have documented all relevant documentation on the behalf of Waddell KATHEE Mon, NP.  I, Waddell KATHEE Mon, NP, have reviewed all documentation for this visit. The documentation on 08/03/2024 for the exam, diagnosis, procedures, and orders are all accurate and complete.    [1]  Outpatient Medications Prior to Visit  Medication Sig   Adalimumab 40 MG/0.4ML PNKT Inject 0.4 mLs into the skin every 14 (fourteen) days.   albuterol  (VENTOLIN  HFA) 108 (90 Base) MCG/ACT inhaler Inhale 2 puffs into the lungs every 6 (six) hours as needed for wheezing or shortness of breath.   cycloSPORINE (RESTASIS) 0.05 % ophthalmic emulsion instill 1 drop into each eye twice daily   omeprazole  (PRILOSEC) 40 MG capsule Take 1 capsule (40 mg total) by mouth daily.   No facility-administered medications prior to visit.   "

## 2024-08-03 ENCOUNTER — Other Ambulatory Visit: Payer: Self-pay | Admitting: Physician Assistant

## 2024-08-03 ENCOUNTER — Encounter: Payer: MEDICAID | Admitting: Family Medicine

## 2024-08-03 ENCOUNTER — Inpatient Hospital Stay
Admission: RE | Admit: 2024-08-03 | Discharge: 2024-08-03 | Payer: MEDICAID | Attending: Physician Assistant | Admitting: Physician Assistant

## 2024-08-03 ENCOUNTER — Ambulatory Visit
Admission: RE | Admit: 2024-08-03 | Discharge: 2024-08-03 | Disposition: A | Discharge status: Home / Self Care | Payer: MEDICAID | Source: Ambulatory Visit | Attending: Physician Assistant | Admitting: Physician Assistant

## 2024-08-03 DIAGNOSIS — R599 Enlarged lymph nodes, unspecified: Secondary | ICD-10-CM

## 2024-08-03 DIAGNOSIS — N63 Unspecified lump in unspecified breast: Secondary | ICD-10-CM

## 2024-08-03 DIAGNOSIS — R928 Other abnormal and inconclusive findings on diagnostic imaging of breast: Secondary | ICD-10-CM

## 2024-08-03 DIAGNOSIS — N6311 Unspecified lump in the right breast, upper outer quadrant: Secondary | ICD-10-CM

## 2024-08-03 DIAGNOSIS — Z0000 Encounter for general adult medical examination without abnormal findings: Secondary | ICD-10-CM

## 2024-08-09 ENCOUNTER — Ambulatory Visit
Admission: RE | Admit: 2024-08-09 | Discharge: 2024-08-09 | Disposition: A | Payer: MEDICAID | Source: Ambulatory Visit | Attending: Physician Assistant | Admitting: Physician Assistant

## 2024-08-09 ENCOUNTER — Inpatient Hospital Stay
Admission: RE | Admit: 2024-08-09 | Discharge: 2024-08-09 | Disposition: A | Payer: MEDICAID | Source: Ambulatory Visit | Attending: Physician Assistant | Admitting: Physician Assistant

## 2024-08-09 DIAGNOSIS — R599 Enlarged lymph nodes, unspecified: Secondary | ICD-10-CM

## 2024-08-09 DIAGNOSIS — N6311 Unspecified lump in the right breast, upper outer quadrant: Secondary | ICD-10-CM

## 2024-08-09 HISTORY — PX: BREAST BIOPSY: SHX20

## 2024-08-10 LAB — SURGICAL PATHOLOGY

## 2024-08-11 ENCOUNTER — Telehealth: Payer: Self-pay | Admitting: *Deleted

## 2024-08-11 NOTE — Telephone Encounter (Signed)
 Spoke to patient to confirm upcoming afternoon [facility] clinic appointment on 1/28, paperwork will be sent via mail  Gave location and time, also informed patient that the surgeon's office would be calling as well to get information from them similar to the packet that they will be receiving so make sure to do both.  Reminded patient that all providers will be coming to the clinic to see them HERE and if they had any questions to not hesitate to reach back out to myself or their navigators.

## 2024-08-12 ENCOUNTER — Encounter: Payer: Self-pay | Admitting: *Deleted

## 2024-08-12 DIAGNOSIS — C50411 Malignant neoplasm of upper-outer quadrant of right female breast: Secondary | ICD-10-CM | POA: Insufficient documentation

## 2024-08-16 NOTE — Progress Notes (Signed)
 " Radiation Oncology         [phone] ________________________________  Multidisciplinary Breast Oncology Clinic [facility]) Initial Outpatient Consultation  Name: Quianna Avery MRN: [id number]  Date: 08/17/2024  DOB: [birth year]  RR:Azrx, Waddell NOVAK, NP  Curvin Deward MOULD, MD   REFERRING PHYSICIAN: Curvin Deward III, MD  DIAGNOSIS: There were no encounter diagnoses.  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  HISTORY OF PRESENT ILLNESS::Sheri Hall is a 54 y.o. female who is presenting to the office today for evaluation of her newly diagnosed right breast cancer. She is accompanied by several family members. She is doing well overall.   She presented to medical attention earlier this month for evaluation of a palpable lump in the upper outer right breast. She accordingly underwent a bilateral diagnostic mammogram and bilateral breast ultrasound at [facility] on 08/03/24 that revealed: a highly suspicious 1.6 cm mass in the 9 o'clock right breast located 6 cmfn, correlating with the palpable sire of concern, and a single indeterminate right axillary lymph node with a cortical thickness measuring 0.4 cm. No evidence of malignancy was demonstrated in the left breast. (Benign findings in the left breast include a a 0.4 cm cyst in the 8 o'clock left breast, located 3 cmfn)   Biopsies were collected on 08/09/24 (results detailed as follows):   - Biopsy of the 9 o'clock right breast mass showed grade 2 invasive ductal carcinoma measuring 0.3 cm in the greatest linear extent of the sample, with high-grade DCIS w/ necrosis. Prognostic indicators significant for: estrogen receptor 100% positive and progesterone receptor 70% positive, both with strong staining intensity; Proliferation marker Ki67 at 2%; Her2 status positive; Grade 2.   - Biopsy of the indeterminate  right axillary lymph node showed metastatic carcinoma  Of note: Upon record review, she had a right breast diagnostic mammogram performed in May of 2018 for evaluation of an upper outer right breast mass. The imaging report (dated 12/08/2016) noted no evidence of malignancy in the right breast.   The patient was referred today for presentation in the multidisciplinary conference.  Radiology studies and pathology slides were presented there for review and discussion of treatment options.  A consensus was discussed regarding potential next steps.  PREVIOUS RADIATION THERAPY: No  PAST MEDICAL HISTORY:  Past Medical History:  Diagnosis Date   Anxiety    Arthritis    rheumatoid    Asthma    seasonal   Bipolar disorder (HCC)    Depression    GERD (gastroesophageal reflux disease)    OCD (obsessive compulsive disorder)    PTSD (post-traumatic stress disorder)     PAST SURGICAL HISTORY: Past Surgical History:  Procedure Laterality Date   BREAST BIOPSY Right 08/09/2024   US  RT BREAST BX W LOC DEV 1ST LESION IMG BX SPEC US  GUIDE 08/09/2024 [facility] MAMMOGRAPHY   CESAREAN SECTION     OPEN REDUCTION INTERNAL FIXATION (ORIF) DISTAL RADIAL FRACTURE Left 10/15/2015   Procedure: OPEN TREATMENT OF LEFT DISTAL RADIUS FRACTURE;  Surgeon: Alm Hummer, MD;  Location: [facility];  Service: Orthopedics;  Laterality: Left;    FAMILY HISTORY:  Family History  Problem Relation Age of Onset   Healthy Mother    Healthy Father    Diabetes Maternal Grandmother    Rheum arthritis Maternal Grandmother    Breast cancer Neg Hx  SOCIAL HISTORY:  Social History   Socioeconomic History   Marital status: Single    Spouse name: Not on file   Number of children: Not on file   Years of education: Not on file   Highest education level: GED or equivalent  Occupational History   Not on file  Tobacco Use   Smoking status: Former    Current packs/day: 0.00    Average packs/day: 1 pack/day  for 15.0 years (15.0 ttl pk-yrs)    Types: Cigarettes, Cigars    Quit date: 11/10/2019    Years since quitting: 4.7   Smokeless tobacco: Never  Vaping Use   Vaping status: Never Used  Substance and Sexual Activity   Alcohol  use: Not Currently   Drug use: Not Currently   Sexual activity: Not Currently    Birth control/protection: None  Other Topics Concern   Not on file  Social History Narrative   Not on file   Social Drivers of Health   Tobacco Use: Medium Risk (08/05/2024)   Received from [facility]   Patient History    Smoking Tobacco Use: Former    Smokeless Tobacco Use: Former    Passive Exposure: Not on Actuary Strain: Medium Risk (04/18/2024)   Overall Financial Resource Strain (CARDIA)    Difficulty of Paying Living Expenses: Somewhat hard  Food Insecurity: Food Insecurity Present (04/18/2024)   Epic    Worried About Programme Researcher, Broadcasting/film/video in the Last Year: Sometimes true    Ran Out of Food in the Last Year: Sometimes true  Transportation Needs: Unmet Transportation Needs (04/18/2024)   Epic    Lack of Transportation (Medical): Yes    Lack of Transportation (Non-Medical): Yes  Physical Activity: Insufficiently Active (04/18/2024)   Exercise Vital Sign    Days of Exercise per Week: 1 day    Minutes of Exercise per Session: 10 min  Stress: Stress Concern Present (04/18/2024)   [employer] of Occupational Health - Occupational Stress Questionnaire    Feeling of Stress: Rather much  Social Connections: Unknown (04/18/2024)   Social Connection and Isolation Panel    Frequency of Communication with Friends and Family: More than three times a week    Frequency of Social Gatherings with Friends and Family: Once a week    Attends Religious Services: More than 4 times per year    Active Member of [employer] or Organizations: Yes    Attends Banker Meetings: More than 4 times per year    Marital Status: Patient declined  Depression (PHQ2-9):  Medium Risk (04/25/2024)   Depression (PHQ2-9)    PHQ-2 Score: 9  Alcohol  Screen: Not on file  Housing: High Risk (04/18/2024)   Epic    Unable to Pay for Housing in the Last Year: Yes    Number of Times Moved in the Last Year: 0    Homeless in the Last Year: No  Utilities: Low Risk (06/09/2023)   Received from [facility]   Utilities    In the past 12 months has the electric, gas, oil, or water company threatened to shut off services in your home? : No  Health Literacy: Not on file    ALLERGIES: Allergies[1]  MEDICATIONS:  Current Outpatient Medications  Medication Sig Dispense Refill   Adalimumab 40 MG/0.4ML PNKT Inject 0.4 mLs into the skin every 14 (fourteen) days.     albuterol  (VENTOLIN  HFA) 108 (90 Base) MCG/ACT inhaler Inhale 2 puffs into the lungs every 6 (six)  hours as needed for wheezing or shortness of breath. 1 each 0   cycloSPORINE (RESTASIS) 0.05 % ophthalmic emulsion instill 1 drop into each eye twice daily     omeprazole  (PRILOSEC) 40 MG capsule Take 1 capsule (40 mg total) by mouth daily. 90 capsule 3   No current facility-administered medications for this encounter.    REVIEW OF SYSTEMS: A 10+ POINT REVIEW OF SYSTEMS WAS OBTAINED including neurology, dermatology, psychiatry, cardiac, respiratory, lymph, extremities, GI, GU, musculoskeletal, constitutional, reproductive, HEENT. She did not fill out the provided form   PHYSICAL EXAM:    08/17/2024  Vitals with BMI   Height 5' 1   Weight 151 lbs 8 oz   BMI 28.64   Systolic 125   Diastolic 68   Pulse 68    Lungs are clear to auscultation bilaterally. Heart has regular rate and rhythm. No palpable cervical, supraclavicular, or axillary adenopathy. Abdomen soft, non-tender, normal bowel sounds. Breast: Left breast with no palpable mass, nipple discharge, or bleeding. Left breast is also large and pendulous. Right breast with biopsy site in the upper outer quadrant with band-aid in place and no appreciable  induration, nipple discharge, or bleeding present.   KPS = 80  100 - Normal; no complaints; no evidence of disease. 90   - Able to carry on normal activity; minor signs or symptoms of disease. 80   - Normal activity with effort; some signs or symptoms of disease. 36   - Cares for self; unable to carry on normal activity or to do active work. 60   - Requires occasional assistance, but is able to care for most of his personal needs. 50   - Requires considerable assistance and frequent medical care. 40   - Disabled; requires special care and assistance. 30   - Severely disabled; hospital admission is indicated although death not imminent. 20   - Very sick; hospital admission necessary; active supportive treatment necessary. 10   - Moribund; fatal processes progressing rapidly. 0     - Dead  Karnofsky DA, Abelmann WH, Craver LS and [facility] [phone]) The use of the nitrogen mustards in the palliative treatment of carcinoma: with particular reference to bronchogenic carcinoma Cancer 1 [id number]  LABORATORY DATA:  Lab Results  Component Value Date   WBC 15.8 (H) 04/11/2021   HGB 9.1 (L) 04/11/2021   HCT 28.7 (L) 04/11/2021   MCV 85.7 04/11/2021   PLT 285 04/11/2021   Lab Results  Component Value Date   NA 139 04/11/2021   K 3.0 (L) 04/11/2021   CL 101 04/11/2021   CO2 27 04/11/2021   Lab Results  Component Value Date   ALT 91 (H) 04/11/2021   AST 41 04/11/2021   ALKPHOS 58 04/11/2021   BILITOT 0.5 04/11/2021    PULMONARY FUNCTION TEST:   Review Flowsheet        No data to display          RADIOGRAPHY: US  RT BREAST BX W LOC DEV 1ST LESION IMG BX SPEC US  GUIDE Addendum Date: 08/16/2024 ADDENDUM REPORT: 08/16/2024 13:20 ADDENDUM: PATHOLOGY revealed: Site 1. Breast, right, needle core biopsy, 9 o'clock 6 cmfn mass (ribbon clip)- INVASIVE DUCTAL CARCINOMA, DUCTAL CARCINOMA IN SITU, CRIBRIFORM, HIGH NUCLEAR GRADE, WITH NECROSIS; OVERALL GRADE: 2; LYMPHOVASCULAR INVASION:  PRESENT; CANCER LENGTH: 0.3 CM, INVASIVE CALCIFICATIONS: PRESENT IN DCIS. Pathology results are CONCORDANT with imaging findings, per Dr. Craig Farr. PATHOLOGY revealed: Site 2. Lymph node, needle/core biopsy, (hydromark) : LYMPH NODE WITH METASTATIC  CARCINOMA. Pathology results are CONCORDANT with imaging findings, per Dr. Craig Farr. Pathology results and recommendations below were discussed with patient by telephone. Patient reported biopsy site within normal limits with slight tenderness at the site. Post biopsy care instructions were reviewed, questions were answered and my direct phone number was provided to patient. Patient was instructed to call [facility] if any concerns or questions arise related to the biopsy. RECOMMENDATION: Surgical and oncological consultation. The patient was referred to [facility] with appointment on 08/17/24. Pathology results reported by Mliss CHARM Molt RN 08/10/2024. Electronically Signed   By: Craig Farr M.D.   On: 08/16/2024 13:20   Result Date: 08/16/2024 CLINICAL DATA:  Status post ultrasound-guided core biopsy of right breast mass and abnormal right axillary lymph node. EXAM: ULTRASOUND GUIDED RIGHT BREAST CORE NEEDLE BIOPSY COMPARISON:  Previous exam(s). PROCEDURE: I met with the patient and we discussed the procedure of ultrasound-guided biopsy, including benefits and alternatives. We discussed the high likelihood of a successful procedure. We discussed the risks of the procedure, including infection, bleeding, tissue injury, clip migration, and inadequate sampling. Informed written consent was given. The usual time-out protocol was performed immediately prior to the procedure. 1: Lesion quadrant: Right breast 9 o'clock Using sterile technique and 1% Lidocaine  as local anesthetic, under direct ultrasound visualization, a 12 gauge spring-loaded device was used to  perform biopsy of mass at right breast 9 o'clock using a lateral approach. At the conclusion of the procedure ribbon shaped tissue marker clip was deployed into the biopsy cavity. Follow up 2 view mammogram was performed and dictated separately. 2: Lesion quadrant: Right axilla Using sterile technique and 1% Lidocaine  as local anesthetic, under direct ultrasound visualization, a 14 gauge spring-loaded device was used to perform biopsy of abnormal right axillary lymph node using a lateral approach. At the conclusion of the procedure [facility] shaped tissue marker clip was deployed into the biopsy cavity. Follow up 2 view mammogram was performed and dictated separately. IMPRESSION: Ultrasound guided biopsies of right breast and right axilla. No apparent complications. Electronically Signed: By: Craig Farr M.D. On: 08/09/2024 10:48   US  AXILLARY NODE CORE BIOPSY RIGHT Addendum Date: 08/16/2024 ADDENDUM REPORT: 08/16/2024 13:20 ADDENDUM: PATHOLOGY revealed: Site 1. Breast, right, needle core biopsy, 9 o'clock 6 cmfn mass (ribbon clip)- INVASIVE DUCTAL CARCINOMA, DUCTAL CARCINOMA IN SITU, CRIBRIFORM, HIGH NUCLEAR GRADE, WITH NECROSIS; OVERALL GRADE: 2; LYMPHOVASCULAR INVASION: PRESENT; CANCER LENGTH: 0.3 CM, INVASIVE CALCIFICATIONS: PRESENT IN DCIS. Pathology results are CONCORDANT with imaging findings, per Dr. Craig Farr. PATHOLOGY revealed: Site 2. Lymph node, needle/core biopsy, (hydromark) : LYMPH NODE WITH METASTATIC CARCINOMA. Pathology results are CONCORDANT with imaging findings, per Dr. Craig Farr. Pathology results and recommendations below were discussed with patient by telephone. Patient reported biopsy site within normal limits with slight tenderness at the site. Post biopsy care instructions were reviewed, questions were answered and my direct phone number was provided to patient. Patient was instructed to call [facility] if any concerns or questions arise related to the  biopsy. RECOMMENDATION: Surgical and oncological consultation. The patient was referred to [facility] with appointment on 08/17/24. Pathology results reported by Mliss CHARM Molt RN 08/10/2024. Electronically Signed   By: Craig Farr M.D.   On: 08/16/2024 13:20   Result Date: 08/16/2024 CLINICAL DATA:  Status post ultrasound-guided core biopsy of right breast  mass and abnormal right axillary lymph node. EXAM: ULTRASOUND GUIDED RIGHT BREAST CORE NEEDLE BIOPSY COMPARISON:  Previous exam(s). PROCEDURE: I met with the patient and we discussed the procedure of ultrasound-guided biopsy, including benefits and alternatives. We discussed the high likelihood of a successful procedure. We discussed the risks of the procedure, including infection, bleeding, tissue injury, clip migration, and inadequate sampling. Informed written consent was given. The usual time-out protocol was performed immediately prior to the procedure. 1: Lesion quadrant: Right breast 9 o'clock Using sterile technique and 1% Lidocaine  as local anesthetic, under direct ultrasound visualization, a 12 gauge spring-loaded device was used to perform biopsy of mass at right breast 9 o'clock using a lateral approach. At the conclusion of the procedure ribbon shaped tissue marker clip was deployed into the biopsy cavity. Follow up 2 view mammogram was performed and dictated separately. 2: Lesion quadrant: Right axilla Using sterile technique and 1% Lidocaine  as local anesthetic, under direct ultrasound visualization, a 14 gauge spring-loaded device was used to perform biopsy of abnormal right axillary lymph node using a lateral approach. At the conclusion of the procedure [facility] shaped tissue marker clip was deployed into the biopsy cavity. Follow up 2 view mammogram was performed and dictated separately. IMPRESSION: Ultrasound guided biopsies of right breast and right axilla. No  apparent complications. Electronically Signed: By: Craig Farr M.D. On: 08/09/2024 10:48   MM CLIP PLACEMENT RIGHT Result Date: 08/09/2024 CLINICAL DATA:  Status post ultrasound-guided core biopsy of right breast mass and abnormal right axillary lymph node. EXAM: 3D DIAGNOSTIC RIGHT MAMMOGRAM POST ULTRASOUND BIOPSY COMPARISON:  Previous exam(s). ACR Breast Density Category b: There are scattered areas of fibroglandular density. FINDINGS: 1: 3D Mammographic images were obtained following ultrasound guided biopsy of mass at right breast 9 o'clock. The ribbon biopsy marking clip is in expected position at the site of biopsy. 2: 3D Mammographic images were obtained following ultrasound guided biopsy of abnormal right axillary lymph node. The [facility] biopsy marking clip is in expected position at the site of biopsy. IMPRESSION: 1: Appropriate positioning of the ribbon shaped biopsy marking clip at the site of biopsy in the expected location of concern. 2: Appropriate positioning of the [facility] shaped biopsy marking clip at the site of biopsy in the expected location of concern. Final Assessment: Post Procedure Mammograms for Marker Placement Electronically Signed   By: Craig Farr M.D.   On: 08/09/2024 10:50   MM 3D DIAGNOSTIC MAMMOGRAM BILATERAL BREAST Result Date: 08/03/2024 CLINICAL DATA:  54 year old woman with palpable lump of the upper outer RIGHT breast presents for diagnostic RIGHT and annual LEFT mammogram. Her most recent mammogram is from 2018. EXAM: DIGITAL DIAGNOSTIC BILATERAL MAMMOGRAM WITH TOMOSYNTHESIS AND CAD; ULTRASOUND LEFT BREAST LIMITED; ULTRASOUND RIGHT BREAST LIMITED TECHNIQUE: Bilateral digital diagnostic mammography and breast tomosynthesis was performed. The images were evaluated with computer-aided detection. ; Targeted ultrasound examination of the left breast was performed.; Targeted ultrasound examination of the right breast was performed COMPARISON:  Previous exam(s). ACR  Breast Density Category c: The breasts are heterogeneously dense, which may obscure small masses. FINDINGS: RIGHT: Mammogram: Irregularly shaped mass with spiculated margins and architectural distortion is seen in the upper outer RIGHT breast, corresponding to the patient's palpable abnormality. No additional suspicious findings of the RIGHT breast. Physical examination: Focused examination of the upper-outer RIGHT breast demonstrates a firm fixed mass. Ultrasound: Targeted sonographic evaluation of the RIGHT breast was performed. 1.6 x 1.3 x 1.4 cm irregularly-shaped hypoechoic mass with angular and indistinct margins seen  at 9 o'clock 6 CMFN corresponds to the patient's palpable abnormality and mammographic mass. RIGHT axillary lymph node is seen with cortical thickening measuring 0.4 cm. LEFT: Mammogram: Oval circumscribed mass seen in the anterior depth of the lower inner LEFT breast. No additional suspicious mass, distortion, or microcalcifications are identified to suggest presence of malignancy. Ultrasound: Targeted sonographic evaluation of the lower inner LEFT breast demonstrates an oval circumscribed hypoechoic mass measuring 0.4 x 0.4 x 0.4 cm (8 o'clock 3 CMFN) which corresponds to the mammographic mass. This is consistent with a benign cyst. Nonenlarged lymph nodes seen in the LEFT axilla. IMPRESSION: 1. Highly suspicious 1.6 cm RIGHT breast mass (9 o'clock 6 CMFN). 2. Indeterminate RIGHT axillary lymph node with cortical thickness measuring 0.4 cm. 3. No evidence of LEFT breast malignancy. RECOMMENDATION: 1. Ultrasound-guided core needle biopsy of 1.6 cm RIGHT breast mass (9 o'clock 6 CMFN). 2. Ultrasound-guided core needle biopsy of RIGHT axillary lymph node with cortical thickness measuring 0.4 cm. I have discussed the findings and recommendations with the patient. The biopsy procedure was explained to the patient and questions were answered. Patient expressed their understanding of the biopsy  recommendation. Patient will be scheduled for biopsy at her earliest convenience by the schedulers. Ordering provider will be notified. If applicable, a reminder letter will be sent to the patient regarding the next appointment. BI-RADS CATEGORY  5: Highly suggestive of malignancy. Electronically Signed   By: Aliene Lloyd M.D.   On: 08/03/2024 13:38   US  LIMITED ULTRASOUND INCLUDING AXILLA RIGHT BREAST Result Date: 08/03/2024 CLINICAL DATA:  54 year old woman with palpable lump of the upper outer RIGHT breast presents for diagnostic RIGHT and annual LEFT mammogram. Her most recent mammogram is from 2018. EXAM: DIGITAL DIAGNOSTIC BILATERAL MAMMOGRAM WITH TOMOSYNTHESIS AND CAD; ULTRASOUND LEFT BREAST LIMITED; ULTRASOUND RIGHT BREAST LIMITED TECHNIQUE: Bilateral digital diagnostic mammography and breast tomosynthesis was performed. The images were evaluated with computer-aided detection. ; Targeted ultrasound examination of the left breast was performed.; Targeted ultrasound examination of the right breast was performed COMPARISON:  Previous exam(s). ACR Breast Density Category c: The breasts are heterogeneously dense, which may obscure small masses. FINDINGS: RIGHT: Mammogram: Irregularly shaped mass with spiculated margins and architectural distortion is seen in the upper outer RIGHT breast, corresponding to the patient's palpable abnormality. No additional suspicious findings of the RIGHT breast. Physical examination: Focused examination of the upper-outer RIGHT breast demonstrates a firm fixed mass. Ultrasound: Targeted sonographic evaluation of the RIGHT breast was performed. 1.6 x 1.3 x 1.4 cm irregularly-shaped hypoechoic mass with angular and indistinct margins seen at 9 o'clock 6 CMFN corresponds to the patient's palpable abnormality and mammographic mass. RIGHT axillary lymph node is seen with cortical thickening measuring 0.4 cm. LEFT: Mammogram: Oval circumscribed mass seen in the anterior depth of the  lower inner LEFT breast. No additional suspicious mass, distortion, or microcalcifications are identified to suggest presence of malignancy. Ultrasound: Targeted sonographic evaluation of the lower inner LEFT breast demonstrates an oval circumscribed hypoechoic mass measuring 0.4 x 0.4 x 0.4 cm (8 o'clock 3 CMFN) which corresponds to the mammographic mass. This is consistent with a benign cyst. Nonenlarged lymph nodes seen in the LEFT axilla. IMPRESSION: 1. Highly suspicious 1.6 cm RIGHT breast mass (9 o'clock 6 CMFN). 2. Indeterminate RIGHT axillary lymph node with cortical thickness measuring 0.4 cm. 3. No evidence of LEFT breast malignancy. RECOMMENDATION: 1. Ultrasound-guided core needle biopsy of 1.6 cm RIGHT breast mass (9 o'clock 6 CMFN). 2. Ultrasound-guided core needle biopsy of RIGHT  axillary lymph node with cortical thickness measuring 0.4 cm. I have discussed the findings and recommendations with the patient. The biopsy procedure was explained to the patient and questions were answered. Patient expressed their understanding of the biopsy recommendation. Patient will be scheduled for biopsy at her earliest convenience by the schedulers. Ordering provider will be notified. If applicable, a reminder letter will be sent to the patient regarding the next appointment. BI-RADS CATEGORY  5: Highly suggestive of malignancy. Electronically Signed   By: Aliene Lloyd M.D.   On: 08/03/2024 13:38   US  LIMITED ULTRASOUND INCLUDING AXILLA LEFT BREAST  Result Date: 08/03/2024 CLINICAL DATA:  54 year old woman with palpable lump of the upper outer RIGHT breast presents for diagnostic RIGHT and annual LEFT mammogram. Her most recent mammogram is from 2018. EXAM: DIGITAL DIAGNOSTIC BILATERAL MAMMOGRAM WITH TOMOSYNTHESIS AND CAD; ULTRASOUND LEFT BREAST LIMITED; ULTRASOUND RIGHT BREAST LIMITED TECHNIQUE: Bilateral digital diagnostic mammography and breast tomosynthesis was performed. The images were evaluated with  computer-aided detection. ; Targeted ultrasound examination of the left breast was performed.; Targeted ultrasound examination of the right breast was performed COMPARISON:  Previous exam(s). ACR Breast Density Category c: The breasts are heterogeneously dense, which may obscure small masses. FINDINGS: RIGHT: Mammogram: Irregularly shaped mass with spiculated margins and architectural distortion is seen in the upper outer RIGHT breast, corresponding to the patient's palpable abnormality. No additional suspicious findings of the RIGHT breast. Physical examination: Focused examination of the upper-outer RIGHT breast demonstrates a firm fixed mass. Ultrasound: Targeted sonographic evaluation of the RIGHT breast was performed. 1.6 x 1.3 x 1.4 cm irregularly-shaped hypoechoic mass with angular and indistinct margins seen at 9 o'clock 6 CMFN corresponds to the patient's palpable abnormality and mammographic mass. RIGHT axillary lymph node is seen with cortical thickening measuring 0.4 cm. LEFT: Mammogram: Oval circumscribed mass seen in the anterior depth of the lower inner LEFT breast. No additional suspicious mass, distortion, or microcalcifications are identified to suggest presence of malignancy. Ultrasound: Targeted sonographic evaluation of the lower inner LEFT breast demonstrates an oval circumscribed hypoechoic mass measuring 0.4 x 0.4 x 0.4 cm (8 o'clock 3 CMFN) which corresponds to the mammographic mass. This is consistent with a benign cyst. Nonenlarged lymph nodes seen in the LEFT axilla. IMPRESSION: 1. Highly suspicious 1.6 cm RIGHT breast mass (9 o'clock 6 CMFN). 2. Indeterminate RIGHT axillary lymph node with cortical thickness measuring 0.4 cm. 3. No evidence of LEFT breast malignancy. RECOMMENDATION: 1. Ultrasound-guided core needle biopsy of 1.6 cm RIGHT breast mass (9 o'clock 6 CMFN). 2. Ultrasound-guided core needle biopsy of RIGHT axillary lymph node with cortical thickness measuring 0.4 cm. I have  discussed the findings and recommendations with the patient. The biopsy procedure was explained to the patient and questions were answered. Patient expressed their understanding of the biopsy recommendation. Patient will be scheduled for biopsy at her earliest convenience by the schedulers. Ordering provider will be notified. If applicable, a reminder letter will be sent to the patient regarding the next appointment. BI-RADS CATEGORY  5: Highly suggestive of malignancy. Electronically Signed   By: Aliene Lloyd M.D.   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 We discussed the general course of radiation, potential side effects, and toxicities with radiation and the  patient is interested in this approach. ***  PLAN:  Genetics  MRI Echo & chemo education  Port placement  Neoadjuvant chemotherapy  Right breast lumpectomy w/ right TAD  Adjuvant radiation therapy   Aromatase inhibitor    ------------------------------------------------  Lynwood CHARM Nasuti, PhD, MD  This document serves as a record of services personally performed by Lynwood Nasuti, MD. It was created on his behalf by Dorthy Fuse, a trained medical scribe. The creation of this record is based on the scribe's personal observations and the provider's statements to them. This document has been checked and approved by the attending provider.     [1] No Known Allergies  "

## 2024-08-17 ENCOUNTER — Other Ambulatory Visit: Payer: Self-pay

## 2024-08-17 ENCOUNTER — Encounter: Payer: Self-pay | Admitting: Physical Therapy

## 2024-08-17 ENCOUNTER — Inpatient Hospital Stay (HOSPITAL_BASED_OUTPATIENT_CLINIC_OR_DEPARTMENT_OTHER): Payer: MEDICAID | Admitting: Hematology and Oncology

## 2024-08-17 ENCOUNTER — Inpatient Hospital Stay: Payer: MEDICAID

## 2024-08-17 ENCOUNTER — Encounter: Payer: Self-pay | Admitting: *Deleted

## 2024-08-17 ENCOUNTER — Inpatient Hospital Stay: Payer: MEDICAID | Attending: Hematology and Oncology

## 2024-08-17 ENCOUNTER — Ambulatory Visit
Admission: RE | Admit: 2024-08-17 | Discharge: 2024-08-17 | Disposition: A | Discharge status: Home / Self Care | Payer: MEDICAID | Source: Ambulatory Visit | Attending: Radiation Oncology | Admitting: Radiation Oncology

## 2024-08-17 ENCOUNTER — Ambulatory Visit: Payer: Self-pay | Admitting: General Surgery

## 2024-08-17 ENCOUNTER — Encounter: Payer: Self-pay | Admitting: General Practice

## 2024-08-17 ENCOUNTER — Ambulatory Visit: Payer: MEDICAID | Attending: General Surgery | Admitting: Physical Therapy

## 2024-08-17 VITALS — BP 125/68 | HR 68 | Temp 98.1°F | Resp 18 | Ht 61.0 in | Wt 151.5 lb

## 2024-08-17 DIAGNOSIS — C50411 Malignant neoplasm of upper-outer quadrant of right female breast: Secondary | ICD-10-CM

## 2024-08-17 DIAGNOSIS — Z170 Estrogen receptor positive status [ER+]: Secondary | ICD-10-CM | POA: Insufficient documentation

## 2024-08-17 DIAGNOSIS — R293 Abnormal posture: Secondary | ICD-10-CM | POA: Insufficient documentation

## 2024-08-17 LAB — CBC WITH DIFFERENTIAL (CANCER CENTER ONLY)
Abs Immature Granulocytes: 0.02 10*3/uL (ref 0.00–0.07)
Basophils Absolute: 0 10*3/uL (ref 0.0–0.1)
Basophils Relative: 0 %
Eosinophils Absolute: 0.2 10*3/uL (ref 0.0–0.5)
Eosinophils Relative: 2 %
HCT: 34.7 % — ABNORMAL LOW (ref 36.0–46.0)
Hemoglobin: 11.4 g/dL — ABNORMAL LOW (ref 12.0–15.0)
Immature Granulocytes: 0 %
Lymphocytes Relative: 46 %
Lymphs Abs: 3.7 10*3/uL (ref 0.7–4.0)
MCH: 27.1 pg (ref 26.0–34.0)
MCHC: 32.9 g/dL (ref 30.0–36.0)
MCV: 82.4 fL (ref 80.0–100.0)
Monocytes Absolute: 0.6 10*3/uL (ref 0.1–1.0)
Monocytes Relative: 8 %
Neutro Abs: 3.5 10*3/uL (ref 1.7–7.7)
Neutrophils Relative %: 44 %
Platelet Count: 254 10*3/uL (ref 150–400)
RBC: 4.21 MIL/uL (ref 3.87–5.11)
RDW: 14.6 % (ref 11.5–15.5)
WBC Count: 8.1 10*3/uL (ref 4.0–10.5)
nRBC: 0 % (ref 0.0–0.2)

## 2024-08-17 LAB — CMP (CANCER CENTER ONLY)
ALT: 20 U/L (ref 0–44)
AST: 36 U/L (ref 15–41)
Albumin: 4 g/dL (ref 3.5–5.0)
Alkaline Phosphatase: 55 U/L (ref 38–126)
Anion gap: 11 (ref 5–15)
BUN: 5 mg/dL — ABNORMAL LOW (ref 6–20)
CO2: 26 mmol/L (ref 22–32)
Calcium: 9.4 mg/dL (ref 8.9–10.3)
Chloride: 105 mmol/L (ref 98–111)
Creatinine: 0.65 mg/dL (ref 0.44–1.00)
GFR, Estimated: >60 mL/min
Glucose, Bld: 75 mg/dL (ref 70–99)
Potassium: 4.2 mmol/L (ref 3.5–5.1)
Sodium: 141 mmol/L (ref 135–145)
Total Bilirubin: 0.4 mg/dL (ref 0.0–1.2)
Total Protein: 7.6 g/dL (ref 6.5–8.1)

## 2024-08-17 LAB — GENETIC SCREENING ORDER

## 2024-08-17 MED ORDER — LIDOCAINE-PRILOCAINE 2.5-2.5 % EX CREA: TOPICAL_CREAM | CUTANEOUS | 3 refills | Status: AC

## 2024-08-17 MED ORDER — ONDANSETRON HCL 8 MG PO TABS: 8.0000 mg | ORAL_TABLET | Freq: Three times a day (TID) | ORAL | 1 refills | Status: AC | PRN

## 2024-08-17 MED ORDER — DEXAMETHASONE 4 MG PO TABS: 4.0000 mg | ORAL_TABLET | Freq: Every day | ORAL | 0 refills | Status: AC

## 2024-08-17 MED ORDER — PROCHLORPERAZINE MALEATE 10 MG PO TABS: 10.0000 mg | ORAL_TABLET | Freq: Four times a day (QID) | ORAL | 1 refills | Status: AC | PRN

## 2024-08-17 NOTE — Therapy (Signed)
 " OUTPATIENT PHYSICAL THERAPY BREAST CANCER BASELINE EVALUATION   Patient Name: Sheri Hall MRN: [id number] DOB:1971/06/28, 54 y.o., female Today's Date: 08/17/2024  END OF SESSION:  PT End of Session - 08/17/24 1800     Visit Number 1    Number of Visits 2    Date for Recertification  02/14/25    PT Start Time 1500    PT Stop Time 1530   Also saw pt from [phone] for a total of 42 min   PT Time Calculation (min) 30 min    Activity Tolerance Patient tolerated treatment well    Behavior During Therapy [facility] for tasks assessed/performed          Past Medical History:  Diagnosis Date   Anxiety    Arthritis    rheumatoid    Asthma    seasonal   Bipolar disorder (HCC)    Depression    GERD (gastroesophageal reflux disease)    OCD (obsessive compulsive disorder)    PTSD (post-traumatic stress disorder)    Past Surgical History:  Procedure Laterality Date   BREAST BIOPSY Right 08/09/2024   US  RT BREAST BX W LOC DEV 1ST LESION IMG BX SPEC US  GUIDE 08/09/2024 [facility] MAMMOGRAPHY   CESAREAN SECTION     OPEN REDUCTION INTERNAL FIXATION (ORIF) DISTAL RADIAL FRACTURE Left 10/15/2015   Procedure: OPEN TREATMENT OF LEFT DISTAL RADIUS FRACTURE;  Surgeon: Alm Hummer, MD;  Location: [facility];  Service: Orthopedics;  Laterality: Left;   Patient Active Problem List   Diagnosis Date Noted   Malignant neoplasm of upper-outer quadrant of right breast in female, estrogen receptor positive (HCC) 08/12/2024   Gastroesophageal reflux disease 04/25/2024   Mild intermittent asthma without complication 04/25/2024   Daytime somnolence 06/09/2023   Hyponatremia 04/09/2021   AKI (acute kidney injury) 04/09/2021   Acute metabolic encephalopathy 04/09/2021   Dehydration 03/19/2020   Bipolar I disorder, most recent episode mixed (HCC) 12/29/2019   Major depressive disorder, recurrent episode 12/28/2019   Normocytic anemia 07/17/2018   Rheumatoid arthritis (HCC) 07/17/2018    Sepsis (HCC) 07/16/2018   PTSD (post-traumatic stress disorder) 09/17/2017   Grief 09/17/2017   MDD (major depressive disorder), severe (HCC) 09/16/2017   Rheumatoid arthritis involving both hands with positive rheumatoid factor (HCC) 10/14/2016   Bilateral hand pain 10/14/2016    REFERRING PROVIDER: Dr. Deward Null  REFERRING DIAG: Right breast cancer  THERAPY DIAG:  Malignant neoplasm of upper-outer quadrant of right breast in female, estrogen receptor positive (HCC)  Abnormal posture  Rationale for Evaluation and Treatment: Rehabilitation  ONSET DATE: 08/03/2024  SUBJECTIVE:  SUBJECTIVE STATEMENT: Patient reports she is here today to be seen by her medical team for her newly diagnosed right breast cancer.   PERTINENT HISTORY:  Patient was diagnosed on 08/03/2024 with right grade 2 invasive ductal carcinoma breast cancer. It measures 1.6 cm and is located in the upper outer quadrant. It is triple positive with a Ki67 of 2%.   PATIENT GOALS:   reduce lymphedema risk and learn post op HEP.   PAIN:  Are you having pain? Yes - chronic hand pain from RA  PRECAUTIONS: Active CA   RED FLAGS: None   HAND DOMINANCE: right  WEIGHT BEARING RESTRICTIONS: No  FALLS:  Has patient fallen in last 6 months? 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  COGNITION: Overall cognitive status: Within functional limits for tasks assessed    POSTURE:  Forward head and  rounded shoulders posture  UPPER EXTREMITY AROM/PROM:  A/PROM RIGHT   eval   Shoulder extension 37  Shoulder flexion 157  Shoulder abduction 165  Shoulder internal rotation 54  Shoulder external rotation 79    (Blank rows = not tested)  A/PROM LEFT   eval  Shoulder extension 46  Shoulder flexion 148  Shoulder abduction 174  Shoulder internal rotation 51  Shoulder external rotation 80    (Blank rows = not tested)  CERVICAL AROM: All within normal limits  UPPER EXTREMITY STRENGTH: WNL  LYMPHEDEMA ASSESSMENTS (in cm):   LANDMARK RIGHT   eval  10 cm proximal to olecranon process from proximal aspect of olecranon 28  Olecranon process 22.8  10 cm proximal to ulnar styloid process from proximal aspect of styloid process 20.2  Just distal to ulnar styloid process 14.3  Across hand at thumb web space 17.5  At base of 2nd digit 6.1  (Blank rows = not tested)  LANDMARK LEFT   eval  10 cm proximal to olecranon process from proximal aspect of olecranon 28.4   Olecranon process 22.4  10 cm proximal to ulnar styloid process from proximal aspect of styloid process 19.2  Just distal to ulnar styloid process 14.8  Across hand at thumb web space 17.6  At base of 2nd digit 5.8  (Blank rows = not tested)  L-DEX LYMPHEDEMA SCREENING:  The patient was assessed using the L-Dex machine today to produce a lymphedema index baseline score. The patient will be reassessed on a regular basis (typically every 3 months) to obtain new L-Dex scores. If the score is > 6.5 points away from his/her baseline score indicating onset of subclinical lymphedema, it will be recommended to wear a compression garment for 4 weeks, 12 hours per day and then be reassessed. If the score continues to be > 6.5 points from baseline at reassessment, we will initiate lymphedema treatment. Assessing in this manner has a 95% rate of preventing clinically significant lymphedema.   L-DEX FLOWSHEETS - 08/17/24 1800        L-DEX LYMPHEDEMA SCREENING   Measurement Type Unilateral    L-DEX MEASUREMENT EXTREMITY Upper Extremity    POSITION  Standing    DOMINANT SIDE Right    At Risk Side Right    BASELINE SCORE (UNILATERAL) 0.2  QUICK DASH SURVEY:  Sheri Hall - 08/17/24 0001     Open a tight or new jar --   Pt did not complete          PATIENT EDUCATION:  Education details: Time spent educating patient on aspects of self-care to maximize post op recovery. Patient was educated on where and how to get a post op compression bra to use to reduce post op edema. Patient was also educated on the use of SOZO screenings and surveillance principles for early identification of lymphedema onset. She was instructed to use the post op pillow in the axilla for pressure and pain relief. Patient educated on lymphedema risk reduction and post op shoulder/posture HEP. Person educated: Patient Education method: Explanation, Demonstration, Handout Education comprehension: Patient verbalized understanding and returned demonstration  HOME EXERCISE PROGRAM: Patient was instructed today in a home exercise program today for post op shoulder range of motion. These included active assist shoulder flexion in sitting, scapular retraction, wall walking with shoulder abduction, and hands behind head external rotation.  She was encouraged to do these twice a day, holding 3 seconds and repeating 5 times when permitted by her physician.   ASSESSMENT:  CLINICAL IMPRESSION: Patient was diagnosed on 08/03/2024 with right grade 2 invasive ductal carcinoma breast cancer. It measures 1.6 cm and is located in the upper outer quadrant. It is triple positive with a Ki67 of 2%. She has a positive axillary lymph node. Her multidisciplinary medical team met prior to her assessments to determine a recommended treatment plan. She is planning to have neoadjuvant chemotherapy followed by a right lumpectomy and targeted axillary lymph node  dissection, radiation, and anti-estrogen therapy. She will benefit from a post op PT reassessment to determine needs and from L-Dex screens every 3 months for 2 years to detect subclinical lymphedema.  Pt will benefit from skilled therapeutic intervention to improve on the following deficits: Decreased knowledge of precautions, impaired UE functional use, pain, decreased ROM, postural dysfunction.   PT treatment/interventions: ADL/self-care home management, pt/family education, therapeutic exercise  REHAB POTENTIAL: Excellent  CLINICAL DECISION MAKING: Stable/uncomplicated  EVALUATION COMPLEXITY: Low   GOALS: Goals reviewed with patient? YES  LONG TERM GOALS: (STG=LTG)    Name Target Date Goal status  1 Pt will be able to verbalize understanding of pertinent lymphedema risk reduction practices relevant to her dx specifically related to skin care.  Baseline:  No knowledge 08/17/2024 Achieved at eval  2 Pt will be able to return demo and/or verbalize understanding of the post op HEP related to regaining shoulder ROM. Baseline:  No knowledge 08/17/2024 Achieved at eval  3 Pt will be able to verbalize understanding of the importance of viewing the post op After Breast CA Class video for further lymphedema risk reduction education and therapeutic exercise.  Baseline:  No knowledge 08/17/2024 Achieved at eval  4 Pt will demo she has regained full shoulder ROM and function post operatively compared to baselines.  Baseline: See objective measurements taken today. 02/14/2025     PLAN:  PT FREQUENCY/DURATION: EVAL and 1 follow up appointment.   PLAN FOR NEXT SESSION: will reassess 3-4 weeks post op to determine needs.   Patient will follow up at outpatient cancer rehab 3-4 weeks following surgery.  If the patient requires physical therapy at that time, a specific plan will be dictated and sent to the referring physician for approval. The patient was educated today on appropriate basic range of  motion exercises to begin post operatively and the  importance of viewing the After Breast Cancer class video following surgery.  Patient was educated today on lymphedema risk reduction practices as it pertains to recommendations that will benefit the patient immediately following surgery.  She verbalized good understanding.    Physical Therapy Information for After Breast Cancer Surgery/Treatment:  Lymphedema is a swelling condition that you may be at risk for in your arm if you have lymph nodes removed from the armpit area.  After a sentinel node biopsy, the risk is approximately 5-9% and is higher after an axillary node dissection.  There is treatment available for this condition and it is not life-threatening.  Contact your physician or physical therapist with concerns. You may begin the 4 shoulder/posture exercises (see additional sheet) when permitted by your physician (typically a week after surgery).  If you have drains, you may need to wait until those are removed before beginning range of motion exercises.  A general recommendation is to not lift your arms above shoulder height until drains are removed.  These exercises should be done to your tolerance and gently.  This is not a no pain/no gain type of recovery so listen to your body and stretch into the range of motion that you can tolerate, stopping if you have pain.  If you are having immediate reconstruction, ask your plastic surgeon about doing exercises as he or she may want you to wait. We encourage you to view the After Breast Cancer class video following surgery.  You will learn information related to lymphedema risk, prevention and treatment and additional exercises to regain mobility following surgery.   While undergoing any medical procedure or treatment, try to avoid blood pressure being taken or needle sticks from occurring on the arm on the side of cancer.   This recommendation begins after surgery and continues for the rest of your  life.  This may help reduce your risk of getting lymphedema (swelling in your arm). An excellent resource for those seeking information on lymphedema is the National Lymphedema Network's web site. It can be accessed at www.lymphnet.org If you notice swelling in your hand, arm or breast at any time following surgery (even if it is many years from now), please contact your doctor or physical therapist to discuss this.  Lymphedema can be treated at any time but it is easier for you if it is treated early on.  If you feel like your shoulder motion is not returning to normal in a reasonable amount of time, please contact your surgeon or physical therapist.  [facility] [phone]. [address], [place] [address]  [facility] After Breast Cancer Class  After Breast Cancer Class is a specially designed exercise class video to assist you in a safe recover after having breast cancer surgery.  In this video you will learn how to get back to full function whether your drains were just removed or if you had surgery a month ago. The video can be viewed on this page: [email or web address]-meyer.org/ or on YouTube here: [email or web address].  Class Goals  Understand specific stretches to improve the flexibility of you chest and shoulder. Learn ways to safely strengthen your upper body and improve your posture. Understand the warning signs of infection and why you may be at risk for an arm infection. Learn about Lymphedema and prevention.  ** You do not need to view this video until after surgery.  Drains should be removed to participate in the recommended exercises on the video.  Patient was instructed today in a home exercise program today for post op shoulder range of motion. These included active assist shoulder flexion in sitting, scapular retraction, wall walking with shoulder abduction, and hands  behind head external rotation.  She was encouraged to do these twice a day, holding 3 seconds and repeating 5 times when permitted by her physician.  Eward Wonda Sharps,  08/17/24 6:17 PM   "

## 2024-08-17 NOTE — Progress Notes (Signed)
 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  Six months ago she palpated a right breast mass without pain, nipple discharge, skin changes, or axillary discomfort. She reports the mass has remained stable in size. She had not undergone prior screening mammography.  Diagnostic mammogram, ultrasound, and biopsy showed a 1.6 cm right breast invasive ductal carcinoma with malignant involvement of a biopsied axillary lymph node. Pathology showed ER 100%, PR 70%, HER2 positive, Ki-67 2%, and grade 2 histology.  I reviewed her records extensively and collaborated the history with the patient.  SUMMARY OF ONCOLOGIC HISTORY: Oncology History  Malignant neoplasm of upper-outer quadrant of right breast in female, estrogen receptor positive (HCC)  08/09/2024 Initial Diagnosis   Palpable right breast lump, mammogram showed spiculated mass at 9 o'clock position ultrasound: 1.6 cm, 1 axillary lymph node biopsy: Positive, breast mass biopsy: Grade 2 IDC with DCIS ER 100%, PR 70%, HER2 +3+ by IHC, Ki-67 2%   08/17/2024 Cancer Staging   Staging form: Breast, AJCC 8th Edition - Clinical: Stage IA (cT1c, cN0, cM0, G2, ER+, PR+, HER2+) - Signed by Odean Potts, MD on 08/17/2024 Stage prefix: Initial diagnosis Histologic grading system: 3 grade system      MEDICAL  HISTORY:  Past Medical History:  Diagnosis Date   Anxiety    Arthritis    rheumatoid    Asthma    seasonal   Bipolar disorder (HCC)    Depression    GERD (gastroesophageal reflux disease)    OCD (obsessive compulsive disorder)    PTSD (post-traumatic stress disorder)     SURGICAL HISTORY: Past Surgical History:  Procedure Laterality Date   BREAST BIOPSY Right 08/09/2024   US  RT BREAST BX W LOC DEV 1ST LESION IMG BX SPEC US  GUIDE 08/09/2024 [facility] MAMMOGRAPHY   CESAREAN SECTION     OPEN REDUCTION INTERNAL FIXATION (ORIF) DISTAL RADIAL FRACTURE Left 10/15/2015   Procedure: OPEN TREATMENT OF LEFT DISTAL RADIUS FRACTURE;  Surgeon: Alm Hummer, MD;  Location: [facility];  Service: Orthopedics;  Laterality: Left;    SOCIAL HISTORY: Social History   Socioeconomic History   Marital status: Single    Spouse name: Not on file   Number of children: Not on file   Years of education: Not on file   Highest education level: GED or equivalent  Occupational History   Not on file  Tobacco Use   Smoking status: Former    Current packs/day: 0.00    Average packs/day: 1 pack/day for 15.0 years (15.0 ttl pk-yrs)    Types: Cigarettes, Cigars    Quit date: 11/10/2019    Years since quitting: 4.7   Smokeless tobacco: Never  Vaping Use   Vaping status: Never Used  Substance and Sexual Activity   Alcohol  use: Not Currently   Drug use: Not Currently   Sexual activity: Not Currently    Birth control/protection: None  Other Topics Concern   Not on file  Social History Narrative   Not on file   Social Drivers of Health   Tobacco Use: Medium Risk (08/05/2024)   Received from [facility]   Patient History    Smoking Tobacco Use: Former    Smokeless Tobacco Use: Former    Passive Exposure: Not on Actuary Strain: Medium Risk (04/18/2024)   Overall Financial Resource Strain (CARDIA)    Difficulty of Paying Living Expenses: Somewhat hard  Food Insecurity:  Food Insecurity Present (04/18/2024)   Epic    Worried About Programme Researcher, Broadcasting/film/video in the Last Year: Sometimes true    Ran Out of Food in the Last Year: Sometimes true  Transportation Needs: Unmet Transportation Needs (04/18/2024)   Epic    Lack of Transportation (Medical): Yes    Lack of Transportation (Non-Medical): Yes  Physical Activity: Insufficiently Active (04/18/2024)   Exercise Vital Sign    Days of Exercise per Week: 1 day    Minutes of Exercise per Session: 10 min  Stress: Stress Concern Present (04/18/2024)   [employer] of Occupational Health - Occupational Stress Questionnaire    Feeling of Stress: Rather much  Social Connections: Unknown (04/18/2024)   Social Connection and Isolation Panel    Frequency of Communication with Friends and Family: More than three times a week    Frequency of Social Gatherings with Friends and Family: Once a week    Attends Religious Services: More than 4 times per year    Active Member of [employer] or Organizations: Yes    Attends Banker Meetings: More than 4 times per year    Marital Status: Patient declined  Intimate Partner Violence: Unknown (10/25/2021)   Received from [facility]   HITS    Physically Hurt: Not on file    Insult or Talk Down To: Not on file    Threaten Physical Harm: Not on file    Scream or Curse: Not on file  Depression (PHQ2-9): Medium Risk (04/25/2024)   Depression (PHQ2-9)    PHQ-2 Score: 9  Alcohol  Screen: Not on file  Housing: High Risk (04/18/2024)   Epic    Unable to Pay for Housing in the Last Year: Yes    Number of Times Moved in the Last Year: 0    Homeless in the Last Year: No  Utilities: Low Risk (06/09/2023)   Received from [facility]   Utilities    In the past 12 months has the electric, gas, oil, or water company threatened to shut off services in your home? : No  Health Literacy: Not on file    FAMILY HISTORY: Family History  Problem Relation Age of Onset   Healthy Mother     Healthy Father    Diabetes Maternal Grandmother    Rheum arthritis Maternal Grandmother    Breast cancer Neg Hx     ALLERGIES:  has no known allergies.  MEDICATIONS:  Current Outpatient Medications  Medication Sig Dispense Refill   Adalimumab 40 MG/0.4ML PNKT Inject 0.4 mLs into the skin every 14 (fourteen) days.     albuterol  (VENTOLIN  HFA) 108 (90 Base) MCG/ACT inhaler Inhale 2 puffs into the lungs every 6 (six) hours as needed for wheezing or shortness of breath. 1 each 0   cycloSPORINE (RESTASIS) 0.05 % ophthalmic emulsion instill 1 drop into each eye twice daily     omeprazole  (PRILOSEC) 40 MG capsule Take 1 capsule (40 mg total) by mouth daily. 90 capsule 3   No current facility-administered  medications for this visit.    REVIEW OF SYSTEMS:   Constitutional: Denies fevers, chills or abnormal night sweats   All other systems were reviewed with the patient and are negative.  PHYSICAL EXAMINATION: ECOG PERFORMANCE STATUS: 1 - Symptomatic but completely ambulatory  Vitals:   08/17/24 1412  BP: 125/68  Pulse: 68  Resp: 18  Temp: 98.1 F (36.7 C)  SpO2: 99%   Filed Weights   08/17/24 1412  Weight: 151 lb 8 oz (68.7 kg)    GENERAL:alert, no distress and comfortable    LABORATORY DATA:  I have reviewed the data as listed Lab Results  Component Value Date   WBC 15.8 (H) 04/11/2021   HGB 9.1 (L) 04/11/2021   HCT 28.7 (L) 04/11/2021   MCV 85.7 04/11/2021   PLT 285 04/11/2021   Lab Results  Component Value Date   NA 139 04/11/2021   K 3.0 (L) 04/11/2021   CL 101 04/11/2021   CO2 27 04/11/2021    RADIOGRAPHIC STUDIES: I have personally reviewed the radiological reports and agreed with the findings in the report.  ASSESSMENT AND PLAN:  Malignant neoplasm of upper-outer quadrant of right breast in female, estrogen receptor positive (HCC) 08/09/2024:Palpable right breast lump, mammogram showed spiculated mass at 9 o'clock position ultrasound: 1.6 cm, 1 axillary  lymph node biopsy: Positive, breast mass biopsy: Grade 2 IDC with DCIS ER 100%, PR 70%, HER2 +3+ by IHC, Ki-67 2%  Pathology and radiology counseling: Discussed with the patient, the details of pathology including the type of breast cancer,the clinical staging, the significance of ER, PR and HER-2/neu receptors and the implications for treatment. After reviewing the pathology in detail, we proceeded to discuss the different treatment options between surgery, radiation, chemotherapy, antiestrogen therapies.  Recommendation based on multidisciplinary tumor board: 1. Neoadjuvant chemotherapy with TCH Perjeta 6 cycles followed by Herceptin Perjeta maintenance versus Kadcyla maintenance (based on response to neoadjuvant chemo) for 1 year 2. Followed by breast conserving surgery if possible with sentinel lymph node study 3. Followed by adjuvant radiation therapy  4.  Adjuvant antiestrogen therapy  Chemotherapy Counseling: I discussed the risks and benefits of chemotherapy including the risks of nausea/ vomiting, risk of infection from low WBC count, fatigue due to chemo or anemia, bruising or bleeding due to low platelets, mouth sores, loss/ change in taste and decreased appetite. Liver and kidney function will be monitored through out chemotherapy as abnormalities in liver and kidney function may be a side effect of treatment. Cardiac dysfunction due to Herceptin and Perjeta and neuropathy risk from Taxotere were discussed in detail. Risk of permanent bone marrow dysfunction due to chemo were also discussed.  Plan: 1. Port placement 2. Echocardiogram 3. Chemotherapy class 4. Breast MRI  She has bipolar disorder history. Her husband is currently in [place].  Return to clinic in 2 weeks to start chemotherapy.   All questions were answered. The patient knows to call the clinic with any problems, questions or concerns. I personally spent a total of 30 minutes in the care of the patient today  including preparing to see the patient, getting/reviewing separately obtained history, performing a medically appropriate exam/evaluation, counseling and educating, placing orders, referring and communicating with other health care professionals, documenting clinical information in the EHR, independently interpreting results, communicating results, and coordinating care.   Viinay K Stpehanie Montroy, MD 08/17/24

## 2024-08-17 NOTE — Research (Unsigned)
 [employer] 2021-05 - Specimen Collection Study to Evaluate Biomarkers in Subjects with Cancer    Trial:  *** Patient Sheri Hall was identified by *** as a potential candidate for the above listed study.  This Clinical Research {Presenter:[id number]} met with [name], [id number], on 08/17/24 in a manner and location that ensures patient privacy to discuss participation in the above listed research study.  Patient is {Accompanied?:[phone]}.  A copy of the informed consent document {Embedded HIPAA:[id number]} was provided to the patient.  Patient reads, speaks, and understands English.   Patient was provided with the business card of this {Presenter:[id number]} and encouraged to contact the research team with any questions.  Approximately *** minutes were spent with the patient reviewing the informed consent documents.  Patient was provided the option of taking informed consent documents home to review and was encouraged to review at their convenience with their support network, including other care providers. {Document Review Location:[phone]}

## 2024-08-17 NOTE — Research (Unsigned)
 D7794, ICE COMPRESS: RANDOMIZED TRIAL OF LIMB CRYOCOMPRESSION VERSUS CONTINUOUS COMPRESSION VERSUS LOW CYCLIC COMPRESSION FOR THE PREVENTION OF TAXANE-INDUCED PERIPHERAL NEUROPATHY   Trial:  *** Patient Sheri Hall was identified by *** as a potential candidate for the above listed study.  This Clinical Research {Presenter:[phone]} met with Sheri Hall, [id number], on 08/17/24 in a manner and location that ensures patient privacy to discuss participation in the above listed research study.  Patient is {Accompanied?:[phone]}.  A copy of the informed consent document {Embedded [phone]} was provided to the patient.  Patient reads, speaks, and understands English.   Patient was provided with the business card of this {Presenter:[phone]} and encouraged to contact the research team with any questions.  Approximately *** minutes were spent with the patient reviewing the informed consent documents.  Patient was provided the option of taking informed consent documents home to review and was encouraged to review at their convenience with their support network, including other care providers. {Document Review Location:[phone]}

## 2024-08-17 NOTE — Research (Unsigned)
 [phone]: Complementary Options for Symptom Management In Cancer (COSMIC) Assessing Benefits and Harms of Cannabis and Cannabinoid Use Among a Cohort of Cancer Patients Treated in Community Oncology Clinics    Trial:  *** Patient Sheri Hall was identified by *** as a potential candidate for the above listed study.  This Clinical Research {[phone]} met with [name], [id number], on 08/17/24 in a manner and location that ensures patient privacy to discuss participation in the above listed research study.  Patient is {Accompanied?:[phone]}.  A copy of the informed consent document {Embedded HIPAA:[id number]} was provided to the patient.  Patient reads, speaks, and understands English.   Patient was provided with the business card of this {[phone]} and encouraged to contact the research team with any questions.  Approximately *** minutes were spent with the patient reviewing the informed consent documents.  Patient was provided the option of taking informed consent documents home to review and was encouraged to review at their convenience with their support network, including other care providers. {Document Review Location:[phone]}

## 2024-08-17 NOTE — Assessment & Plan Note (Signed)
 08/09/2024:Palpable right breast lump, mammogram showed spiculated mass at 9 o'clock position ultrasound: 1.6 cm, 1 axillary lymph node biopsy: Positive, breast mass biopsy: Grade 2 IDC with DCIS ER 100%, PR 70%, HER2 +3+ by IHC, Ki-67 2%  Pathology and radiology counseling: Discussed with the patient, the details of pathology including the type of breast cancer,the clinical staging, the significance of ER, PR and HER-2/neu receptors and the implications for treatment. After reviewing the pathology in detail, we proceeded to discuss the different treatment options between surgery, radiation, chemotherapy, antiestrogen therapies.  Recommendation based on multidisciplinary tumor board: 1. Neoadjuvant chemotherapy with TCH Perjeta 6 cycles followed by Herceptin Perjeta maintenance versus Kadcyla maintenance (based on response to neoadjuvant chemo) for 1 year 2. Followed by breast conserving surgery if possible with sentinel lymph node study 3. Followed by adjuvant radiation therapy if patient had lumpectomy  Chemotherapy Counseling: I discussed the risks and benefits of chemotherapy including the risks of nausea/ vomiting, risk of infection from low WBC count, fatigue due to chemo or anemia, bruising or bleeding due to low platelets, mouth sores, loss/ change in taste and decreased appetite. Liver and kidney function will be monitored through out chemotherapy as abnormalities in liver and kidney function may be a side effect of treatment. Cardiac dysfunction due to Herceptin and Perjeta and neuropathy risk from Taxotere were discussed in detail. Risk of permanent bone marrow dysfunction due to chemo were also discussed.  Plan: 1. Port placement 2. Echocardiogram 3. Chemotherapy class 4. Breast MRI  Return to clinic in 2 weeks to start chemotherapy.

## 2024-08-17 NOTE — Progress Notes (Signed)
 START ON PATHWAY REGIMEN - Breast     Cycle 1: A cycle is 21 days:     Pertuzumab-xxxx      Trastuzumab-xxxx      Docetaxel      Carboplatin    Cycles 2 through 6: A cycle is every 21 days:     Pertuzumab-xxxx      Trastuzumab-xxxx      Docetaxel      Carboplatin   **Always confirm dose/schedule in your pharmacy ordering system**  Patient Characteristics: Preoperative or Nonsurgical Candidate, M0 (Clinical Staging), Up to cT4c, Any N, M0, Neoadjuvant Therapy followed by Surgery, Invasive Disease, Chemotherapy, HER2 Positive, ER Positive Therapeutic Status: Preoperative or Nonsurgical Candidate, M0 (Clinical Staging) AJCC T Category: cT1c AJCC N Category: cN0 AJCC M Category: cM0 AJCC Grade: G2 ER Status: Positive (+) PR Status: Positive (+) HER2 Status: Positive (+) AJCC 8 Stage Grouping: IA Breast Surgical Plan: Neoadjuvant Therapy followed by Surgery Intent of Therapy: Curative Intent, Discussed with Patient

## 2024-08-18 ENCOUNTER — Other Ambulatory Visit: Payer: Self-pay

## 2024-08-18 ENCOUNTER — Inpatient Hospital Stay: Payer: MEDICAID

## 2024-08-18 ENCOUNTER — Telehealth: Payer: Self-pay

## 2024-08-18 ENCOUNTER — Encounter: Payer: Self-pay | Admitting: Hematology and Oncology

## 2024-08-18 DIAGNOSIS — Z170 Estrogen receptor positive status [ER+]: Secondary | ICD-10-CM

## 2024-08-18 NOTE — Progress Notes (Signed)
 [facility] Spiritual Care Note  Met with Sheri Hall and a supportive friend in Breast Multidisciplinary Clinic to introduce Support Center team/resources.  Sheri Hall completed SDOH screening; results follow below.  SDOH Interventions    Flowsheet Row Office Visit from 04/25/2024 in [facility] at [facility]  SDOH Interventions   Depression Interventions/Treatment  Counseling    SDOH Screenings   Food Insecurity: No Food Insecurity (08/18/2024)  Housing: High Risk (08/18/2024)  Transportation Needs: Unmet Transportation Needs (08/18/2024)  Utilities: At Risk (08/18/2024)  Depression (PHQ2-9): Low Risk (08/18/2024)  Financial Resource Strain: Medium Risk (04/18/2024)  Physical Activity: Insufficiently Active (04/18/2024)  Social Connections: Unknown (04/18/2024)  Stress: Stress Concern Present (04/18/2024)  Tobacco Use: Medium Risk (08/17/2024)   Chaplain and patient discussed common feelings and emotions when being diagnosed with cancer, and the importance of support during treatment.  Chaplain informed patient of the support team and support services at [facility].  Chaplain provided contact information and encouraged patient to call with any questions or concerns.  Placing Social Work referral re SDOH screening. Placing Alight Guide referral and adding Sheri Hall to [facility] programming mailing list per her request.   Follow up needed: Yes.  Given her level of distress today, we plan to follow up by phone next week for a Spiritual Care check-in. Sheri Hall also has direct Spiritual Care number in case needs arise in the meantime.   [address] Olam Corrigan, [place], [facility] Pager [phone] Voicemail [phone]

## 2024-08-18 NOTE — Telephone Encounter (Signed)
 D7794, ICE COMPRESS: RANDOMIZED TRIAL OF LIMB CRYOCOMPRESSION VERSUS CONTINUOUS COMPRESSION VERSUS LOW CYCLIC COMPRESSION FOR THE PREVENTION  OF TAXANE-INDUCED PERIPHERAL NEUROPATHY   Followed up with patient on study interest. Patient not interested in participating. Patient has contact information if she has any questions. Dr. Odean notified.  Laury Quale, MPH  Clinical Research Coordinator

## 2024-08-18 NOTE — Progress Notes (Signed)
 REFERRING PROVIDER: Curvin Sheri MOULD, MD [address] [place],  [place] [address]  PRIMARY PROVIDER:  Almarie Waddell NOVAK, NP  PRIMARY REASON FOR VISIT:  1. Malignant neoplasm of upper-outer quadrant of right breast in female, estrogen receptor positive (HCC)    HISTORY OF PRESENT ILLNESS:   Sheri Hall, a 54 y.o. female, was seen for a [place] cancer genetics consultation at the request of Sheri Curvin, MD due to a personal history of breast cancer.  Sheri Hall presents today the at the [facility] to discuss the possibility of a hereditary predisposition to cancer, genetic testing, and to further clarify her future cancer risks, as well as potential cancer risks for family members.  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   CANCER HISTORY:  Oncology History  Malignant neoplasm of upper-outer quadrant of right breast in female, estrogen receptor positive (HCC)  08/09/2024 Initial Diagnosis   Palpable right breast lump, mammogram showed spiculated mass at 9 o'clock position ultrasound: 1.6 cm, 1 axillary lymph node biopsy: Positive, breast mass biopsy: Grade 2 IDC with DCIS ER 100%, PR 70%, HER2 +3+ by IHC, Ki-67 2%   08/17/2024 Cancer Staging   Staging form: Breast, AJCC 8th Edition - Clinical: Stage IA (cT1c, cN0, cM0, G2, ER+, PR+, HER2+) - Signed by Sheri Potts, MD on 08/17/2024 Stage prefix: Initial diagnosis Histologic grading system: 3 grade system   08/31/2024 -  Chemotherapy   Patient is on Treatment Plan : BREAST  Docetaxel + Carboplatin + Trastuzumab + Pertuzumab  (TCHP) q21d       RISK FACTORS:  Hysterectomy: no.  Menopausal status: postmenopausal.  Colonoscopy: yes; 02/08/2021  Mammogram within the last year: yes. Number of breast biopsies: 1.  Past Medical History:  Diagnosis Date    Anxiety    Arthritis    rheumatoid    Asthma    seasonal   Bipolar disorder (HCC)    Depression    GERD (gastroesophageal reflux disease)    OCD (obsessive compulsive disorder)    PTSD (post-traumatic stress disorder)    Past Surgical History:  Procedure Laterality Date   BREAST BIOPSY Right 08/09/2024   US  RT BREAST BX W LOC DEV 1ST LESION IMG BX SPEC US  GUIDE 08/09/2024 [facility] MAMMOGRAPHY   CESAREAN SECTION     OPEN REDUCTION INTERNAL FIXATION (ORIF) DISTAL RADIAL FRACTURE Left 10/15/2015   Procedure: OPEN TREATMENT OF LEFT DISTAL RADIUS FRACTURE;  Surgeon: Alm Hummer, MD;  Location: [facility];  Service: Orthopedics;  Laterality: Left;   Social History   Socioeconomic History   Marital status: Single    Spouse name: Not on file   Number of children: Not on file   Years of education: Not on file   Highest education level: GED or equivalent  Occupational History   Not on file  Tobacco Use   Smoking status: Former    Current packs/day: 0.00    Average packs/day: 1 pack/day for 15.0 years (15.0 ttl pk-yrs)    Types: Cigarettes, Cigars    Quit date: 11/10/2019    Years since quitting: 4.7   Smokeless tobacco: Never  Vaping Use   Vaping status: Never Used  Substance and Sexual Activity   Alcohol  use: Not Currently   Drug use: Not Currently   Sexual activity: Not Currently  Birth control/protection: None  Other Topics Concern   Not on file  Social History Narrative   Not on file   Social Drivers of Hall   Tobacco Use: Medium Risk (08/17/2024)   Patient History    Smoking Tobacco Use: Former    Smokeless Tobacco Use: Never    Passive Exposure: Not on file  Financial Resource Strain: Medium Risk (04/18/2024)   Overall Financial Resource Strain (CARDIA)    Difficulty of Paying Living Expenses: Somewhat hard  Food Insecurity: No Food Insecurity (08/18/2024)   Epic    Worried About Programme Researcher, Broadcasting/film/video in the Last Year: Never true    Ran Out of Food  in the Last Year: Never true  Transportation Needs: Unmet Transportation Needs (08/18/2024)   Epic    Lack of Transportation (Medical): No    Lack of Transportation (Non-Medical): Yes  Physical Activity: Insufficiently Active (04/18/2024)   Exercise Vital Sign    Days of Exercise per Week: 1 day    Minutes of Exercise per Session: 10 min  Stress: Stress Concern Present (04/18/2024)   Sheri Hall - Occupational Stress Questionnaire    Feeling of Stress: Rather much  Social Connections: Unknown (04/18/2024)   Social Connection and Isolation Panel    Frequency of Communication with Friends and Family: More than three times a week    Frequency of Social Gatherings with Friends and Family: Once a week    Attends Religious Services: More than 4 times per year    Active Member of [employer] or Organizations: Yes    Attends Banker Meetings: More than 4 times per year    Marital Status: Patient declined  Depression (PHQ2-9): Low Risk (08/18/2024)   Depression (PHQ2-9)    PHQ-2 Score: 1  Alcohol  Screen: Not on file  Housing: High Risk (08/18/2024)   Epic    Unable to Pay for Housing in the Last Year: Yes    Number of Times Moved in the Last Year: 0    Homeless in the Last Year: No  Utilities: At Risk (08/18/2024)   Epic    Threatened with loss of utilities: Yes  Hall Literacy: Not on file    FAMILY HISTORY:  We obtained a detailed, 4-generation family history pasted below.   Sheri Hall is unaware of relatives completing genetic testing for hereditary cancer risks.   Pedigree Summary No reported family history of cancer. She noted that she has limited information about her paternal family history.  Significant diagnoses are listed below: Family History  Problem Relation Age of Onset   Healthy Mother    Healthy Father        limited info   Diabetes Maternal Grandmother    Rheum arthritis Maternal Grandmother    Breast cancer Neg Hx     GENETIC COUNSELING ASSESSMENT: Sheri Hall is a 54 y.o. female with a personal history of breast cancer which is somewhat suggestive of a hereditary cancer predisposition syndrome. We, therefore, discussed and recommended the following at today's visit.   DISCUSSION: We discussed that, in general, most cancer is not inherited in families, but instead is sporadic or familial. Sporadic cancers occur by chance and typically happen at older ages (>50 years) as this type of cancer is caused by genetic changes acquired during an individuals lifetime. Some families have more cancers than would be expected by chance; however, the ages or types of cancer are not consistent with a known genetic mutation or known genetic mutations  have been ruled out. This type of familial cancer is thought to be due to a combination of multiple genetic, environmental, hormonal, and lifestyle factors. While this combination of factors likely increases the risk of cancer, the exact source of this risk is not currently identifiable or testable.  We discussed that 5-10% of cancer is the result of germline (heritable) genetic variants, with most cases associated with BRCA1/BRCA2. There are other genes that can be associated with hereditary cancer syndromes. We discussed that testing is beneficial for several reasons including knowing how to follow individuals after completing their treatment, identifying whether potential treatment options such as PARP inhibitors would be beneficial, and understanding if other family members could be at risk for cancer and allow them to undergo genetic testing.   We reviewed the characteristics, features and inheritance patterns of hereditary cancer syndromes. We also discussed genetic testing, including the appropriate family members to test, the process of testing, insurance coverage and turn-around-time for results. We discussed the implications of a negative, positive, carrier and/or variant of  uncertain significant result. Sheri Hall  was offered a common hereditary cancer panel (40 genes) and an expanded pan-cancer panel (77 genes). Sheri Hall was informed of the benefits and limitations of each panel, including that expanded pan-cancer panels contain genes that do not have clear management guidelines at this point in time.  We also discussed that as the number of genes included on a panel increases, the chances of variants of uncertain significance increases.  GENETIC TESTING NATIONAL CRITERIA: Based on Sheri Hall's personal history of cancer she meets medical criteria for genetic testing based on the [employer] (NCCN) guidelines.SABRA  Despite that she meets criteria, she  may still have an out of pocket cost. She completed the [facility] patient assistance form in the office a copy with be uploaded with her testing. We discussed that if her out of pocket cost for testing is over $100, the laboratory will send a text with the estimated out-of-pocket cost.  If the out of pocket cost of testing is less than $100 she will be billed by the genetic testing laboratory.   GENETIC TESTING CONSENT:  After considering the risks, benefits, and limitations, Sheri Hall provided informed consent to pursue genetic testing. A blood sample was sent to [facility] for analysis of the BRCAplus and CancerNext+RNA Panel. Results should be available within approximately 7-10 days for initial results weeks' time, at which point they will be disclosed by telephone to Sheri Hall , as will any additional recommendations warranted by these results. Sheri Hall will receive a summary of her genetic counseling visit and a copy of her results once available. This information will also be available in Epic.  BRCAplus: ATM, BARD1, BRCA1, BRCA2, CDH1, CHEK2, NF1, PALB2, PTEN, RAD51C, RAD51D, STK11 and TP53 (sequencing and deletion/duplication) [facility] CancerNext + RNAinsight gene  panel which includes sequencing, rearrangement analysis, and RNA analysis for the following 40 genes: APC, ATM, BAP1, BARD1, BMPR1A, BRCA1, BRCA2, BRIP1, CDH1, CDKN2A, CHEK2, FH, FLCN, MET, MLH1, MSH2, MSH6, MUTYH, NF1, NTHL1, PALB2, PMS2, PTEN, RAD51C, RAD51D, RPS20, SMAD4, STK11, TP53, TSC1, TSC2, and VHL (sequencing and deletion/duplication); AXIN2, HOXB13, MBD4, MSH3, POLD1 and POLE (sequencing only); EPCAM and GREM1 (deletion/duplication only).  GENETIC INFORMATION NONDISCRIMINATION ACT (GINA): We discussed that some people do not want to undergo genetic testing due to fear of genetic discrimination.  The Genetic Information Nondiscrimination Act (GINA) was signed into federal law in 2008. GINA prohibits Hall insurers and most employers  from discriminating against individuals based on genetic information (including the results of genetic tests and family history information). According to GINA, Hall insurance companies cannot consider genetic information to be a preexisting condition, nor can they use it to make decisions regarding coverage or rates. GINA also makes it illegal for most employers to use genetic information in making decisions about hiring, firing, promotion, or terms of employment. It is important to note that GINA does not offer protections for life insurance, disability insurance, or long-term care insurance. GINA does not apply to those in the [employer], those who work for companies with less than 15 employees, and new life insurance or long-term disability insurance policies.  Hall status due to a cancer diagnosis is not protected under GINA. More information about GINA can be found by visiting eliteclients.be. Lastly, we encouraged Sheri Hall to remain in contact with cancer genetics annually so that we can continuously update the family history and inform her of any changes in cancer genetics and testing that may be of benefit for this family.   Sheri Hall's questions  were answered to her satisfaction today. Our contact information was provided should additional questions or concerns arise. Thank you for the referral and allowing us  to share in the care of your patient.   Resources:  [employer] was provided with the following:  [employer] Hereditary Cancer Testing Patient Guide [employer] Patient Assistance Information Sheet [facility] CancerNext + RNAinsight gene list  PLAN:  Testing Ordered: Conservation Officer, Nature + RNAinsight   Lorenzo Pereyra Lindsey-Mills, MS, Magazine Features Editor  Email: [email or web address]  Phone: [phone]  I personally spent a total of 20 minutes in the care of the patient today including preparing to see the patient, counseling and educating, placing orders, and documenting clinical information in the EHR.  The patient was joined by partner, a friend and her grandson.Drs. Lanny Stalls, and/or Gudena were available for questions, if needed. _______________________________________________________________________ For Office Staff:  Number of people involved in session: 4 Was an Intern/ student involved with case: no

## 2024-08-18 NOTE — Telephone Encounter (Signed)
 [phone]: Complementary Options for Symptom Management In Cancer (COSMIC) Assessing Benefits and Harms of Cannabis and Cannabinoid Use Among a Cohort of Cancer Patients Treated in [facility]    Followed up with patient via phone. Patient seems interested in study. Will coordinate with patient schedule to coordinate consent at next in person appointment. Patient has contact information if she has any questions.  Laury Quale, MPH  Clinical Research Coordinator

## 2024-08-18 NOTE — Telephone Encounter (Signed)
 [employer] 2021-05 - Specimen Collection Study to Evaluate Biomarkers in Subjects with Cancer    Followed up with patient via phone. Patient seems interested in study. Will coordinate with patient schedule to coordinate consent at next in person appointment. Patient has contact information if she has any questions.  Laury Quale, MPH  Clinical Research Coordinator

## 2024-08-19 ENCOUNTER — Telehealth: Payer: Self-pay | Admitting: *Deleted

## 2024-08-19 NOTE — Telephone Encounter (Signed)
 Left message for a return phone call to inform her she will need to hold humira while on chemo.

## 2024-08-24 ENCOUNTER — Telehealth: Payer: Self-pay

## 2024-08-24 ENCOUNTER — Other Ambulatory Visit: Payer: Self-pay | Admitting: Pharmacist

## 2024-08-24 DIAGNOSIS — C50411 Malignant neoplasm of upper-outer quadrant of right female breast: Secondary | ICD-10-CM

## 2024-08-24 NOTE — Telephone Encounter (Signed)
 [id number]- Effectiveness of Out-of Pocket Cost Communication and Financial Navigation (CostCOM) in Cancer Patients:    Followed up with patient via phone. Patient confirms research appointment tomorrow. Patient has contact information if she has any questions.  Laury Quale, MPH  Clinical Research Coordinator

## 2024-08-24 NOTE — Telephone Encounter (Signed)
 I contacted Sheri Hall to discuss her genetic testing results. No pathogenic variants were identified in the 13 genes analyzed.  I reviewed with Sheri Hall  that the genes tested include the primary genes associated with hereditary breast cancer syndromes.    I will follow up with her once the remainder of her results are back.    Image of the initial test report below:    Santana Fryer, MS, CGC  Certified Genetic Counselor  Email: [email or web address]  Phone: [phone]

## 2024-08-25 ENCOUNTER — Telehealth: Payer: Self-pay | Admitting: *Deleted

## 2024-08-25 ENCOUNTER — Other Ambulatory Visit: Payer: Self-pay | Admitting: Pharmacist

## 2024-08-25 ENCOUNTER — Inpatient Hospital Stay: Payer: MEDICAID

## 2024-08-25 ENCOUNTER — Ambulatory Visit (HOSPITAL_COMMUNITY): Admission: RE | Admit: 2024-08-25 | Discharge: 2024-08-25 | Payer: MEDICAID | Attending: Hematology and Oncology

## 2024-08-25 ENCOUNTER — Encounter: Payer: Self-pay | Admitting: *Deleted

## 2024-08-25 ENCOUNTER — Other Ambulatory Visit: Payer: Self-pay

## 2024-08-25 DIAGNOSIS — C50411 Malignant neoplasm of upper-outer quadrant of right female breast: Secondary | ICD-10-CM

## 2024-08-25 MED ORDER — GADOBUTROL 1 MMOL/ML IV SOLN
7.0000 mL | Freq: Once | INTRAVENOUS | Status: AC | PRN
  Administered 2024-08-25: 7 mL via INTRAVENOUS

## 2024-08-25 NOTE — Research (Signed)
 DCP-001: Use of a Clinical Trial Screening Tool to Address Cancer Health Disparities in the NCI Community Oncology Research Program [facility])   Patient Sheri Hall was identified by Dr. Gudena as a potential candidate for the above listed study.  This Clinical Research Coordinator met with Sheri Hall, Sheri Hall, on 08/25/24 in a manner and location that ensures patient privacy to discuss participation in the above listed research study.  Patient is Unaccompanied.   Patient reads, speaks, and understands English.   Patient was previously provided with the contact information of this Coordinator and encouraged to contact the research team with any questions.  Approximately 10 minutes were spent with the patient reviewing the informed consent documents.  Patient was provided the option of taking informed consent documents home to review and was encouraged to review at their convenience with their support network, including other care providers. Patient is comfortable with making a decision regarding study participation today. Met with patient today in person. Patient expressed feelings that she is overwhelmed and decided not to participate in this one time questionnaire study.  Patient has contact information if she has any questions.  Laury Quale, MPH  Clinical Research Coordinator

## 2024-08-25 NOTE — Telephone Encounter (Signed)
 Left message for a return phone call to follow up from [facility] 1/28 and assess navigation needs.  Left VM also stating patient will need to hold humira while on chemo.

## 2024-08-25 NOTE — Research (Signed)
 [employer] 2021-05 - Specimen Collection Study to Evaluate Biomarkers in Subjects with Cancer    Met with patient today in person. Patient expressed feelings that she is overwhelmed and decided not to participate in this specimen study.  Patient has contact information if she has any questions. Dr. Odean notified.  Laury Quale, MPH  Clinical Research Coordinator

## 2024-08-25 NOTE — Research (Signed)
 [phone]: Complementary Options for Symptom Management In Cancer (COSMIC) Assessing Benefits and Harms of Cannabis and Cannabinoid Use Among a Cohort of Cancer Patients Treated in [employer]    Met with patient today in person. Patient expressed feelings that she is overwhelmed and decided not to participate in this symptom management study.  Patient has contact information if she has any questions. Dr. Odean notified.  Laury Quale, MPH  Clinical Research Coordinator

## 2024-08-30 ENCOUNTER — Other Ambulatory Visit (HOSPITAL_COMMUNITY): Payer: MEDICAID

## 2024-08-31 ENCOUNTER — Inpatient Hospital Stay: Payer: MEDICAID

## 2024-08-31 ENCOUNTER — Ambulatory Visit (HOSPITAL_COMMUNITY): Payer: MEDICAID

## 2024-08-31 ENCOUNTER — Inpatient Hospital Stay: Payer: MEDICAID | Admitting: Pharmacist

## 2024-09-01 ENCOUNTER — Encounter (HOSPITAL_COMMUNITY): Admission: RE | Payer: Self-pay | Source: Home / Self Care

## 2024-09-01 ENCOUNTER — Ambulatory Visit (HOSPITAL_COMMUNITY): Admission: RE | Admit: 2024-09-01 | Payer: MEDICAID | Source: Home / Self Care | Admitting: General Surgery

## 2024-09-05 ENCOUNTER — Inpatient Hospital Stay: Payer: MEDICAID

## 2024-09-07 ENCOUNTER — Inpatient Hospital Stay: Payer: MEDICAID

## 2024-09-16 ENCOUNTER — Inpatient Hospital Stay: Payer: MEDICAID | Admitting: Adult Health

## 2024-09-16 ENCOUNTER — Inpatient Hospital Stay: Payer: MEDICAID
